# Patient Record
Sex: Male | Born: 1965 | Race: Black or African American | Hispanic: No | Marital: Single | Smoking: Never smoker
Health system: Southern US, Community
[De-identification: ages and names within clinical notes are randomized; demographics above are authoritative.]

## PROBLEM LIST (undated history)

## (undated) DIAGNOSIS — K5900 Constipation, unspecified: Secondary | ICD-10-CM

## (undated) DIAGNOSIS — D649 Anemia, unspecified: Secondary | ICD-10-CM

## (undated) DIAGNOSIS — J45909 Unspecified asthma, uncomplicated: Secondary | ICD-10-CM

## (undated) DIAGNOSIS — C801 Malignant (primary) neoplasm, unspecified: Secondary | ICD-10-CM

## (undated) DIAGNOSIS — C642 Malignant neoplasm of left kidney, except renal pelvis: Secondary | ICD-10-CM

## (undated) DIAGNOSIS — I10 Essential (primary) hypertension: Secondary | ICD-10-CM

## (undated) HISTORY — DX: Malignant neoplasm of left kidney, except renal pelvis: C64.2

## (undated) HISTORY — PX: NO PAST SURGERIES: SHX2092

---

## 2016-12-25 DIAGNOSIS — M109 Gout, unspecified: Secondary | ICD-10-CM | POA: Diagnosis not present

## 2017-01-21 DIAGNOSIS — Z13220 Encounter for screening for lipoid disorders: Secondary | ICD-10-CM | POA: Diagnosis not present

## 2017-01-21 DIAGNOSIS — Z131 Encounter for screening for diabetes mellitus: Secondary | ICD-10-CM | POA: Diagnosis not present

## 2017-01-21 DIAGNOSIS — Z125 Encounter for screening for malignant neoplasm of prostate: Secondary | ICD-10-CM | POA: Diagnosis not present

## 2017-01-21 DIAGNOSIS — I10 Essential (primary) hypertension: Secondary | ICD-10-CM | POA: Diagnosis not present

## 2017-01-21 DIAGNOSIS — Z202 Contact with and (suspected) exposure to infections with a predominantly sexual mode of transmission: Secondary | ICD-10-CM | POA: Diagnosis not present

## 2017-01-28 DIAGNOSIS — R7303 Prediabetes: Secondary | ICD-10-CM | POA: Diagnosis not present

## 2017-01-28 DIAGNOSIS — I10 Essential (primary) hypertension: Secondary | ICD-10-CM | POA: Diagnosis not present

## 2017-02-11 DIAGNOSIS — M109 Gout, unspecified: Secondary | ICD-10-CM | POA: Diagnosis not present

## 2017-02-22 DIAGNOSIS — I10 Essential (primary) hypertension: Secondary | ICD-10-CM | POA: Diagnosis not present

## 2017-02-22 DIAGNOSIS — Z79899 Other long term (current) drug therapy: Secondary | ICD-10-CM | POA: Diagnosis not present

## 2017-06-23 DIAGNOSIS — M109 Gout, unspecified: Secondary | ICD-10-CM | POA: Diagnosis not present

## 2017-07-07 DIAGNOSIS — M25462 Effusion, left knee: Secondary | ICD-10-CM | POA: Diagnosis not present

## 2017-07-07 DIAGNOSIS — I10 Essential (primary) hypertension: Secondary | ICD-10-CM | POA: Diagnosis not present

## 2017-07-07 DIAGNOSIS — M1732 Unilateral post-traumatic osteoarthritis, left knee: Secondary | ICD-10-CM | POA: Diagnosis not present

## 2017-07-07 DIAGNOSIS — M25562 Pain in left knee: Secondary | ICD-10-CM | POA: Diagnosis not present

## 2017-07-11 DIAGNOSIS — M25562 Pain in left knee: Secondary | ICD-10-CM | POA: Diagnosis not present

## 2017-07-11 DIAGNOSIS — G8929 Other chronic pain: Secondary | ICD-10-CM | POA: Diagnosis not present

## 2017-07-11 DIAGNOSIS — I10 Essential (primary) hypertension: Secondary | ICD-10-CM | POA: Diagnosis not present

## 2017-07-13 DIAGNOSIS — M25562 Pain in left knee: Secondary | ICD-10-CM | POA: Diagnosis not present

## 2017-07-13 DIAGNOSIS — M1732 Unilateral post-traumatic osteoarthritis, left knee: Secondary | ICD-10-CM | POA: Diagnosis not present

## 2017-07-15 DIAGNOSIS — M1712 Unilateral primary osteoarthritis, left knee: Secondary | ICD-10-CM | POA: Diagnosis not present

## 2017-07-29 DIAGNOSIS — M25562 Pain in left knee: Secondary | ICD-10-CM | POA: Diagnosis not present

## 2017-07-29 DIAGNOSIS — Z79899 Other long term (current) drug therapy: Secondary | ICD-10-CM | POA: Diagnosis not present

## 2017-07-29 DIAGNOSIS — I10 Essential (primary) hypertension: Secondary | ICD-10-CM | POA: Diagnosis not present

## 2017-07-29 DIAGNOSIS — R7303 Prediabetes: Secondary | ICD-10-CM | POA: Diagnosis not present

## 2017-08-30 DIAGNOSIS — M1712 Unilateral primary osteoarthritis, left knee: Secondary | ICD-10-CM | POA: Diagnosis not present

## 2017-11-07 DIAGNOSIS — I10 Essential (primary) hypertension: Secondary | ICD-10-CM | POA: Diagnosis not present

## 2017-11-07 DIAGNOSIS — R0789 Other chest pain: Secondary | ICD-10-CM | POA: Diagnosis not present

## 2017-11-07 DIAGNOSIS — R079 Chest pain, unspecified: Secondary | ICD-10-CM | POA: Diagnosis not present

## 2017-11-07 DIAGNOSIS — K219 Gastro-esophageal reflux disease without esophagitis: Secondary | ICD-10-CM | POA: Diagnosis not present

## 2017-11-10 DIAGNOSIS — I10 Essential (primary) hypertension: Secondary | ICD-10-CM | POA: Diagnosis not present

## 2017-11-10 DIAGNOSIS — R079 Chest pain, unspecified: Secondary | ICD-10-CM | POA: Diagnosis not present

## 2017-11-24 DIAGNOSIS — R079 Chest pain, unspecified: Secondary | ICD-10-CM | POA: Diagnosis not present

## 2018-02-07 DIAGNOSIS — K649 Unspecified hemorrhoids: Secondary | ICD-10-CM | POA: Diagnosis not present

## 2018-02-07 DIAGNOSIS — E669 Obesity, unspecified: Secondary | ICD-10-CM | POA: Diagnosis not present

## 2018-02-07 DIAGNOSIS — Z6837 Body mass index (BMI) 37.0-37.9, adult: Secondary | ICD-10-CM | POA: Diagnosis not present

## 2018-02-07 DIAGNOSIS — I10 Essential (primary) hypertension: Secondary | ICD-10-CM | POA: Diagnosis not present

## 2018-08-26 DIAGNOSIS — M5416 Radiculopathy, lumbar region: Secondary | ICD-10-CM | POA: Diagnosis not present

## 2018-08-26 DIAGNOSIS — Z760 Encounter for issue of repeat prescription: Secondary | ICD-10-CM | POA: Diagnosis not present

## 2018-08-26 DIAGNOSIS — I10 Essential (primary) hypertension: Secondary | ICD-10-CM | POA: Diagnosis not present

## 2018-09-29 DIAGNOSIS — I10 Essential (primary) hypertension: Secondary | ICD-10-CM | POA: Diagnosis not present

## 2018-09-29 DIAGNOSIS — Z125 Encounter for screening for malignant neoplasm of prostate: Secondary | ICD-10-CM | POA: Diagnosis not present

## 2018-09-29 DIAGNOSIS — Z79899 Other long term (current) drug therapy: Secondary | ICD-10-CM | POA: Diagnosis not present

## 2018-09-29 DIAGNOSIS — M109 Gout, unspecified: Secondary | ICD-10-CM | POA: Diagnosis not present

## 2018-09-29 DIAGNOSIS — R7301 Impaired fasting glucose: Secondary | ICD-10-CM | POA: Diagnosis not present

## 2018-09-29 DIAGNOSIS — M6780 Other specified disorders of synovium and tendon, unspecified site: Secondary | ICD-10-CM | POA: Diagnosis not present

## 2018-09-30 DIAGNOSIS — T783XXA Angioneurotic edema, initial encounter: Secondary | ICD-10-CM | POA: Diagnosis not present

## 2018-09-30 DIAGNOSIS — T464X5A Adverse effect of angiotensin-converting-enzyme inhibitors, initial encounter: Secondary | ICD-10-CM | POA: Diagnosis not present

## 2018-09-30 DIAGNOSIS — I10 Essential (primary) hypertension: Secondary | ICD-10-CM | POA: Diagnosis not present

## 2018-10-03 DIAGNOSIS — D509 Iron deficiency anemia, unspecified: Secondary | ICD-10-CM | POA: Diagnosis not present

## 2018-10-05 DIAGNOSIS — M25552 Pain in left hip: Secondary | ICD-10-CM | POA: Diagnosis not present

## 2018-10-05 DIAGNOSIS — M5137 Other intervertebral disc degeneration, lumbosacral region: Secondary | ICD-10-CM | POA: Diagnosis not present

## 2018-10-05 DIAGNOSIS — M546 Pain in thoracic spine: Secondary | ICD-10-CM | POA: Diagnosis not present

## 2018-10-05 DIAGNOSIS — S3992XA Unspecified injury of lower back, initial encounter: Secondary | ICD-10-CM | POA: Diagnosis not present

## 2018-10-05 DIAGNOSIS — M545 Low back pain: Secondary | ICD-10-CM | POA: Diagnosis not present

## 2018-10-05 DIAGNOSIS — D649 Anemia, unspecified: Secondary | ICD-10-CM | POA: Diagnosis not present

## 2018-10-11 DIAGNOSIS — D7389 Other diseases of spleen: Secondary | ICD-10-CM | POA: Diagnosis not present

## 2018-10-11 DIAGNOSIS — R310 Gross hematuria: Secondary | ICD-10-CM | POA: Diagnosis not present

## 2018-10-11 DIAGNOSIS — N2889 Other specified disorders of kidney and ureter: Secondary | ICD-10-CM | POA: Diagnosis not present

## 2018-10-12 DIAGNOSIS — M545 Low back pain: Secondary | ICD-10-CM | POA: Diagnosis not present

## 2018-10-12 DIAGNOSIS — N2889 Other specified disorders of kidney and ureter: Secondary | ICD-10-CM | POA: Diagnosis not present

## 2018-10-12 DIAGNOSIS — Z6837 Body mass index (BMI) 37.0-37.9, adult: Secondary | ICD-10-CM | POA: Diagnosis not present

## 2018-10-12 DIAGNOSIS — R161 Splenomegaly, not elsewhere classified: Secondary | ICD-10-CM | POA: Diagnosis not present

## 2018-10-26 DIAGNOSIS — C642 Malignant neoplasm of left kidney, except renal pelvis: Secondary | ICD-10-CM | POA: Diagnosis not present

## 2018-10-26 DIAGNOSIS — K5900 Constipation, unspecified: Secondary | ICD-10-CM

## 2018-10-26 DIAGNOSIS — N289 Disorder of kidney and ureter, unspecified: Secondary | ICD-10-CM | POA: Diagnosis not present

## 2018-10-26 DIAGNOSIS — D509 Iron deficiency anemia, unspecified: Secondary | ICD-10-CM | POA: Diagnosis not present

## 2018-10-26 DIAGNOSIS — E538 Deficiency of other specified B group vitamins: Secondary | ICD-10-CM | POA: Diagnosis not present

## 2018-10-26 DIAGNOSIS — E559 Vitamin D deficiency, unspecified: Secondary | ICD-10-CM | POA: Diagnosis not present

## 2018-11-08 DIAGNOSIS — N289 Disorder of kidney and ureter, unspecified: Secondary | ICD-10-CM | POA: Diagnosis not present

## 2018-11-08 DIAGNOSIS — R918 Other nonspecific abnormal finding of lung field: Secondary | ICD-10-CM | POA: Diagnosis not present

## 2018-11-08 DIAGNOSIS — C649 Malignant neoplasm of unspecified kidney, except renal pelvis: Secondary | ICD-10-CM | POA: Diagnosis not present

## 2018-11-08 DIAGNOSIS — C642 Malignant neoplasm of left kidney, except renal pelvis: Secondary | ICD-10-CM | POA: Diagnosis not present

## 2018-11-08 DIAGNOSIS — R911 Solitary pulmonary nodule: Secondary | ICD-10-CM | POA: Diagnosis not present

## 2018-11-10 DIAGNOSIS — N2889 Other specified disorders of kidney and ureter: Secondary | ICD-10-CM | POA: Diagnosis not present

## 2018-11-10 DIAGNOSIS — C642 Malignant neoplasm of left kidney, except renal pelvis: Secondary | ICD-10-CM | POA: Diagnosis not present

## 2018-11-10 DIAGNOSIS — I10 Essential (primary) hypertension: Secondary | ICD-10-CM | POA: Diagnosis not present

## 2018-11-15 DIAGNOSIS — C642 Malignant neoplasm of left kidney, except renal pelvis: Secondary | ICD-10-CM | POA: Diagnosis not present

## 2018-11-15 DIAGNOSIS — N289 Disorder of kidney and ureter, unspecified: Secondary | ICD-10-CM | POA: Diagnosis not present

## 2018-11-17 DIAGNOSIS — D500 Iron deficiency anemia secondary to blood loss (chronic): Secondary | ICD-10-CM | POA: Insufficient documentation

## 2018-11-24 DIAGNOSIS — N289 Disorder of kidney and ureter, unspecified: Secondary | ICD-10-CM | POA: Diagnosis not present

## 2018-11-24 DIAGNOSIS — C642 Malignant neoplasm of left kidney, except renal pelvis: Secondary | ICD-10-CM | POA: Diagnosis not present

## 2018-11-25 DIAGNOSIS — R58 Hemorrhage, not elsewhere classified: Secondary | ICD-10-CM | POA: Diagnosis not present

## 2018-11-25 DIAGNOSIS — R310 Gross hematuria: Secondary | ICD-10-CM | POA: Diagnosis not present

## 2018-11-25 DIAGNOSIS — N2889 Other specified disorders of kidney and ureter: Secondary | ICD-10-CM | POA: Diagnosis not present

## 2018-11-25 DIAGNOSIS — R319 Hematuria, unspecified: Secondary | ICD-10-CM | POA: Diagnosis not present

## 2018-11-25 DIAGNOSIS — C642 Malignant neoplasm of left kidney, except renal pelvis: Secondary | ICD-10-CM | POA: Diagnosis not present

## 2018-11-25 DIAGNOSIS — D49512 Neoplasm of unspecified behavior of left kidney: Secondary | ICD-10-CM | POA: Diagnosis not present

## 2018-11-25 DIAGNOSIS — R52 Pain, unspecified: Secondary | ICD-10-CM | POA: Diagnosis not present

## 2018-12-04 DIAGNOSIS — C642 Malignant neoplasm of left kidney, except renal pelvis: Secondary | ICD-10-CM | POA: Diagnosis not present

## 2018-12-04 DIAGNOSIS — D649 Anemia, unspecified: Secondary | ICD-10-CM | POA: Diagnosis not present

## 2018-12-04 DIAGNOSIS — R310 Gross hematuria: Secondary | ICD-10-CM | POA: Diagnosis not present

## 2018-12-04 DIAGNOSIS — Z125 Encounter for screening for malignant neoplasm of prostate: Secondary | ICD-10-CM | POA: Diagnosis not present

## 2018-12-04 DIAGNOSIS — R1012 Left upper quadrant pain: Secondary | ICD-10-CM | POA: Diagnosis not present

## 2018-12-13 ENCOUNTER — Other Ambulatory Visit: Payer: Self-pay | Admitting: Urology

## 2018-12-26 DIAGNOSIS — K649 Unspecified hemorrhoids: Secondary | ICD-10-CM | POA: Diagnosis not present

## 2018-12-26 DIAGNOSIS — D649 Anemia, unspecified: Secondary | ICD-10-CM | POA: Diagnosis not present

## 2018-12-26 DIAGNOSIS — I10 Essential (primary) hypertension: Secondary | ICD-10-CM | POA: Diagnosis not present

## 2018-12-26 DIAGNOSIS — R310 Gross hematuria: Secondary | ICD-10-CM | POA: Diagnosis not present

## 2018-12-26 DIAGNOSIS — R0602 Shortness of breath: Secondary | ICD-10-CM | POA: Diagnosis not present

## 2018-12-26 DIAGNOSIS — C641 Malignant neoplasm of right kidney, except renal pelvis: Secondary | ICD-10-CM | POA: Diagnosis not present

## 2018-12-26 DIAGNOSIS — R0600 Dyspnea, unspecified: Secondary | ICD-10-CM | POA: Diagnosis not present

## 2018-12-27 DIAGNOSIS — D509 Iron deficiency anemia, unspecified: Secondary | ICD-10-CM | POA: Diagnosis not present

## 2018-12-28 DIAGNOSIS — D509 Iron deficiency anemia, unspecified: Secondary | ICD-10-CM | POA: Diagnosis not present

## 2018-12-29 DIAGNOSIS — D509 Iron deficiency anemia, unspecified: Secondary | ICD-10-CM | POA: Diagnosis not present

## 2018-12-29 DIAGNOSIS — C642 Malignant neoplasm of left kidney, except renal pelvis: Secondary | ICD-10-CM | POA: Diagnosis not present

## 2019-01-01 DIAGNOSIS — R1012 Left upper quadrant pain: Secondary | ICD-10-CM | POA: Diagnosis not present

## 2019-01-01 DIAGNOSIS — R310 Gross hematuria: Secondary | ICD-10-CM | POA: Diagnosis not present

## 2019-01-01 DIAGNOSIS — C642 Malignant neoplasm of left kidney, except renal pelvis: Secondary | ICD-10-CM | POA: Diagnosis not present

## 2019-01-01 DIAGNOSIS — D649 Anemia, unspecified: Secondary | ICD-10-CM | POA: Diagnosis not present

## 2019-01-02 DIAGNOSIS — C642 Malignant neoplasm of left kidney, except renal pelvis: Secondary | ICD-10-CM | POA: Diagnosis not present

## 2019-01-02 DIAGNOSIS — D509 Iron deficiency anemia, unspecified: Secondary | ICD-10-CM | POA: Diagnosis not present

## 2019-01-04 NOTE — Patient Instructions (Addendum)
YOU NEED TO HAVE A COVID 19 TEST ON__8-9-20_____ @_______ , THIS TEST MUST BE DONE BEFORE SURGERY, [address]E  [address] [place] , [id number]. ONCE YOUR COVID TEST IS COMPLETED, PLEASE BEGIN THE QUARANTINE INSTRUCTIONS AS OUTLINED IN YOUR HANDOUT.                Yusuke Beza  01/04/2019   Your procedure is scheduled on:  01-10-19   Report to [facility] Main  Entrance              Report to admitting at       1000 AM   [place].    Call this number if you have problems the morning of surgery [phone]    Remember: Follow a clear liquid diet the day before surgery and drink one bottle of magnesium citrate by noon the day         before your surgery    CLEAR LIQUID DIET   Foods Allowed                                                                     Foods Excluded  Coffee and tea, regular and decaf                             liquids that you cannot  Plain Jell-O any favor except red or purple                                           see through such as: Fruit ices (not with fruit pulp)                                     milk, soups, orange juice  Iced Popsicles                                    All solid food Carbonated beverages, regular and diet                                    Cranberry, grape and apple juices Sports drinks like Gatorade Lightly seasoned clear broth or consume(fat free) Sugar, honey syrup  Sample Menu Breakfast                                Lunch                                     Supper Cranberry juice                    Beef broth  Chicken broth Jell-O                                     Grape juice                           Apple juice Coffee or tea                        Jell-O                                      Popsicle                                                Coffee or tea                        Coffee or  tea  _____________________________________________________________________  BRUSH YOUR TEETH MORNING OF SURGERY AND RINSE YOUR MOUTH OUT, NO CHEWING GUM CANDY OR MINTS.     Take these medicines the morning of surgery with A SIP OF WATER:  DO NOT TAKE ANY DIABETIC MEDICATIONS DAY OF YOUR SURGERY                               You may not have any metal on your body including hair pins and              piercings  Do not wear jewelry, , lotions, powders or perfumes, deodorant                      Men may shave face and neck.   Do not bring valuables to the hospital. [place].  Contacts, dentures or bridgework may not be worn into surgery.    [place] - Preparing for Surgery  Before surgery, you can play an important role.  Because skin is not sterile, your skin needs to be as free of germs as possible.  You can reduce the number of germs on your skin by washing with CHG (chlorahexidine gluconate) soap before surgery.  CHG is an antiseptic cleaner which kills germs and bonds with the skin to continue killing germs even after washing. Please DO NOT use if you have an allergy to CHG or antibacterial soaps.  If your skin becomes reddened/irritated stop using the CHG and inform your nurse when you arrive at Short Stay. Do not shave (including legs and underarms) for at least 48 hours prior to the first CHG shower.  You may shave your face/neck. Please follow these instructions carefully:  1.  Shower with CHG Soap the night before surgery and the  morning of Surgery.  2.  If you choose to wash your hair, wash your hair first as usual with your  normal  shampoo.  3.  After you shampoo, rinse your hair and body thoroughly to remove the  shampoo.  4.  Use CHG as you would any other liquid soap.  You can apply chg directly  to the skin and wash                       Gently with a scrungie or clean washcloth.  5.  Apply the  CHG Soap to your body ONLY FROM THE NECK DOWN.   Do not use on face/ open                           Wound or open sores. Avoid contact with eyes, ears mouth and genitals (private parts).                       Wash face,  Genitals (private parts) with your normal soap.             6.  Wash thoroughly, paying special attention to the area where your surgery  will be performed.  7.  Thoroughly rinse your body with warm water from the neck down.  8.  DO NOT shower/wash with your normal soap after using and rinsing off  the CHG Soap.                9.  Pat yourself dry with a clean towel.            10.  Wear clean pajamas.            11.  Place clean sheets on your bed the night of your first shower and do not  sleep with pets. Day of Surgery : Do not apply any lotions/deodorants the morning of surgery.  Please wear clean clothes to the hospital/surgery center.  FAILURE TO FOLLOW THESE INSTRUCTIONS MAY RESULT IN THE CANCELLATION OF YOUR SURGERY PATIENT SIGNATURE_________________________________  NURSE SIGNATURE__________________________________  ________________________________________________________________________

## 2019-01-05 ENCOUNTER — Encounter (HOSPITAL_COMMUNITY)
Admission: RE | Admit: 2019-01-05 | Discharge: 2019-01-05 | Disposition: A | Discharge status: Home / Self Care | Payer: BC Managed Care – PPO | Source: Ambulatory Visit | Attending: Urology | Admitting: Urology

## 2019-01-05 ENCOUNTER — Other Ambulatory Visit: Payer: Self-pay

## 2019-01-05 ENCOUNTER — Encounter (HOSPITAL_COMMUNITY): Payer: Self-pay

## 2019-01-05 DIAGNOSIS — C642 Malignant neoplasm of left kidney, except renal pelvis: Secondary | ICD-10-CM | POA: Insufficient documentation

## 2019-01-05 DIAGNOSIS — R319 Hematuria, unspecified: Secondary | ICD-10-CM | POA: Diagnosis not present

## 2019-01-05 DIAGNOSIS — Z01818 Encounter for other preprocedural examination: Secondary | ICD-10-CM | POA: Insufficient documentation

## 2019-01-05 DIAGNOSIS — R9431 Abnormal electrocardiogram [ECG] [EKG]: Secondary | ICD-10-CM | POA: Diagnosis not present

## 2019-01-05 HISTORY — DX: Constipation, unspecified: K59.00

## 2019-01-05 HISTORY — DX: Anemia, unspecified: D64.9

## 2019-01-05 HISTORY — DX: Malignant (primary) neoplasm, unspecified: C80.1

## 2019-01-05 HISTORY — DX: Essential (primary) hypertension: I10

## 2019-01-05 HISTORY — DX: Unspecified asthma, uncomplicated: J45.909

## 2019-01-05 LAB — CBC
HCT: 29.7 % — ABNORMAL LOW (ref 39.0–52.0)
Hemoglobin: 8.6 g/dL — ABNORMAL LOW (ref 13.0–17.0)
MCH: 23.1 pg — ABNORMAL LOW (ref 26.0–34.0)
MCHC: 29 g/dL — ABNORMAL LOW (ref 30.0–36.0)
MCV: 79.8 fL — ABNORMAL LOW (ref 80.0–100.0)
Platelets: 355 10*3/uL (ref 150–400)
RBC: 3.72 MIL/uL — ABNORMAL LOW (ref 4.22–5.81)
RDW: 15.8 % — ABNORMAL HIGH (ref 11.5–15.5)
WBC: 7.4 10*3/uL (ref 4.0–10.5)
nRBC: 0 % (ref 0.0–0.2)

## 2019-01-05 LAB — BASIC METABOLIC PANEL
Anion gap: 9 (ref 5–15)
BUN: 14 mg/dL (ref 6–20)
CO2: 26 mmol/L (ref 22–32)
Calcium: 9.2 mg/dL (ref 8.9–10.3)
Chloride: 102 mmol/L (ref 98–111)
Creatinine, Ser: 1.1 mg/dL (ref 0.61–1.24)
GFR calc Af Amer: >60 mL/min (ref >60)
GFR calc non Af Amer: >60 mL/min (ref >60)
Glucose, Bld: 104 mg/dL — ABNORMAL HIGH (ref 70–99)
Potassium: 4.1 mmol/L (ref 3.5–5.1)
Sodium: 137 mmol/L (ref 135–145)

## 2019-01-06 ENCOUNTER — Other Ambulatory Visit (HOSPITAL_COMMUNITY)
Admission: RE | Admit: 2019-01-06 | Discharge: 2019-01-06 | Disposition: A | Discharge status: Home / Self Care | Payer: BC Managed Care – PPO | Source: Ambulatory Visit | Attending: Urology | Admitting: Urology

## 2019-01-06 DIAGNOSIS — Z20828 Contact with and (suspected) exposure to other viral communicable diseases: Secondary | ICD-10-CM | POA: Diagnosis not present

## 2019-01-06 DIAGNOSIS — Z01812 Encounter for preprocedural laboratory examination: Secondary | ICD-10-CM | POA: Diagnosis not present

## 2019-01-06 LAB — ABO/RH: ABO/RH(D): O POS

## 2019-01-07 LAB — SARS CORONAVIRUS 2 (TAT 6-24 HRS): SARS Coronavirus 2: NEGATIVE

## 2019-01-10 ENCOUNTER — Other Ambulatory Visit: Payer: Self-pay

## 2019-01-10 ENCOUNTER — Inpatient Hospital Stay (HOSPITAL_COMMUNITY): Payer: BC Managed Care – PPO | Admitting: Physician Assistant

## 2019-01-10 ENCOUNTER — Inpatient Hospital Stay (HOSPITAL_COMMUNITY)
Admission: RE | Admit: 2019-01-10 | Discharge: 2019-01-12 | DRG: 658 | Disposition: A | Discharge status: Home / Self Care | Payer: BC Managed Care – PPO | Attending: Urology | Admitting: Urology

## 2019-01-10 ENCOUNTER — Encounter (HOSPITAL_COMMUNITY): Payer: Self-pay | Admitting: General Practice

## 2019-01-10 ENCOUNTER — Encounter (HOSPITAL_COMMUNITY)
Admission: RE | Disposition: A | Discharge status: Home / Self Care | Payer: Self-pay | Source: Home / Self Care | Attending: Urology

## 2019-01-10 DIAGNOSIS — C642 Malignant neoplasm of left kidney, except renal pelvis: Principal | ICD-10-CM | POA: Diagnosis present

## 2019-01-10 DIAGNOSIS — I10 Essential (primary) hypertension: Secondary | ICD-10-CM | POA: Diagnosis not present

## 2019-01-10 DIAGNOSIS — Z888 Allergy status to other drugs, medicaments and biological substances status: Secondary | ICD-10-CM | POA: Diagnosis not present

## 2019-01-10 DIAGNOSIS — D7389 Other diseases of spleen: Secondary | ICD-10-CM | POA: Diagnosis not present

## 2019-01-10 DIAGNOSIS — N2889 Other specified disorders of kidney and ureter: Secondary | ICD-10-CM | POA: Diagnosis present

## 2019-01-10 DIAGNOSIS — D630 Anemia in neoplastic disease: Secondary | ICD-10-CM | POA: Diagnosis present

## 2019-01-10 DIAGNOSIS — J45909 Unspecified asthma, uncomplicated: Secondary | ICD-10-CM | POA: Diagnosis present

## 2019-01-10 DIAGNOSIS — G893 Neoplasm related pain (acute) (chronic): Secondary | ICD-10-CM | POA: Diagnosis present

## 2019-01-10 DIAGNOSIS — M109 Gout, unspecified: Secondary | ICD-10-CM | POA: Diagnosis present

## 2019-01-10 DIAGNOSIS — D649 Anemia, unspecified: Secondary | ICD-10-CM | POA: Diagnosis not present

## 2019-01-10 HISTORY — PX: CYSTOSCOPY: SHX5120

## 2019-01-10 HISTORY — PX: ROBOT ASSISTED LAPAROSCOPIC NEPHRECTOMY: SHX5140

## 2019-01-10 LAB — CBC
HCT: 31.2 % — ABNORMAL LOW (ref 39.0–52.0)
Hemoglobin: 9.2 g/dL — ABNORMAL LOW (ref 13.0–17.0)
MCH: 23.2 pg — ABNORMAL LOW (ref 26.0–34.0)
MCHC: 29.5 g/dL — ABNORMAL LOW (ref 30.0–36.0)
MCV: 78.8 fL — ABNORMAL LOW (ref 80.0–100.0)
Platelets: 382 10*3/uL (ref 150–400)
RBC: 3.96 MIL/uL — ABNORMAL LOW (ref 4.22–5.81)
RDW: 15.5 % (ref 11.5–15.5)
WBC: 6.8 10*3/uL (ref 4.0–10.5)
nRBC: 0 % (ref 0.0–0.2)

## 2019-01-10 LAB — PREPARE RBC (CROSSMATCH)

## 2019-01-10 LAB — HEMOGLOBIN AND HEMATOCRIT, BLOOD
HCT: 29.4 % — ABNORMAL LOW (ref 39.0–52.0)
Hemoglobin: 8.8 g/dL — ABNORMAL LOW (ref 13.0–17.0)

## 2019-01-10 SURGERY — NEPHRECTOMY, RADICAL, ROBOT-ASSISTED, LAPAROSCOPIC, ADULT
Anesthesia: General

## 2019-01-10 MED ORDER — ROCURONIUM BROMIDE 10 MG/ML (PF) SYRINGE
PREFILLED_SYRINGE | INTRAVENOUS | Status: AC
  Filled 2019-01-10: qty 10

## 2019-01-10 MED ORDER — CEFAZOLIN SODIUM-DEXTROSE 2-4 GM/100ML-% IV SOLN
2.0000 g | INTRAVENOUS | Status: AC
  Administered 2019-01-10: 2 g via INTRAVENOUS
  Filled 2019-01-10: qty 100

## 2019-01-10 MED ORDER — ONDANSETRON HCL 4 MG/2ML IJ SOLN: 4.0000 mg | INTRAMUSCULAR | Status: DC | PRN

## 2019-01-10 MED ORDER — HYDROMORPHONE HCL 1 MG/ML IJ SOLN
INTRAMUSCULAR | Status: AC
  Filled 2019-01-10: qty 2

## 2019-01-10 MED ORDER — OXYCODONE HCL 5 MG PO TABS
5.0000 mg | ORAL_TABLET | ORAL | Status: DC | PRN
  Administered 2019-01-10 – 2019-01-11 (×2): 5 mg via ORAL
  Filled 2019-01-10 (×2): qty 1

## 2019-01-10 MED ORDER — OXYCODONE HCL 10 MG PO TABS: 10.0000 mg | ORAL_TABLET | ORAL | 0 refills | Status: DC | PRN

## 2019-01-10 MED ORDER — PROPOFOL 10 MG/ML IV BOLUS
INTRAVENOUS | Status: DC | PRN
  Administered 2019-01-10: 40 mg via INTRAVENOUS
  Administered 2019-01-10: 160 mg via INTRAVENOUS

## 2019-01-10 MED ORDER — MIDAZOLAM HCL 5 MG/5ML IJ SOLN
INTRAMUSCULAR | Status: DC | PRN
  Administered 2019-01-10: 2 mg via INTRAVENOUS

## 2019-01-10 MED ORDER — HYDROMORPHONE HCL 1 MG/ML IJ SOLN
0.2500 mg | INTRAMUSCULAR | Status: DC | PRN
  Administered 2019-01-10: 0.25 mg via INTRAVENOUS

## 2019-01-10 MED ORDER — LACTATED RINGERS IR SOLN
Status: DC | PRN
  Administered 2019-01-10: 1000 mL

## 2019-01-10 MED ORDER — DIPHENHYDRAMINE HCL 50 MG/ML IJ SOLN: 12.5000 mg | Freq: Four times a day (QID) | INTRAMUSCULAR | Status: DC | PRN

## 2019-01-10 MED ORDER — SODIUM CHLORIDE (PF) 0.9 % IJ SOLN
INTRAMUSCULAR | Status: DC | PRN
  Administered 2019-01-10: 20 mL

## 2019-01-10 MED ORDER — BUPIVACAINE LIPOSOME 1.3 % IJ SUSP
20.0000 mL | Freq: Once | INTRAMUSCULAR | Status: AC
  Administered 2019-01-10: 15:00:00 20 mL
  Filled 2019-01-10: qty 20

## 2019-01-10 MED ORDER — DIPHENHYDRAMINE HCL 12.5 MG/5ML PO ELIX: 12.5000 mg | ORAL_SOLUTION | Freq: Four times a day (QID) | ORAL | Status: DC | PRN

## 2019-01-10 MED ORDER — GABAPENTIN 300 MG PO CAPS
300.0000 mg | ORAL_CAPSULE | ORAL | Status: AC
  Administered 2019-01-10: 300 mg via ORAL
  Filled 2019-01-10: qty 1

## 2019-01-10 MED ORDER — BACITRACIN-NEOMYCIN-POLYMYXIN 400-5-5000 EX OINT: 1.0000 "application " | TOPICAL_OINTMENT | Freq: Three times a day (TID) | CUTANEOUS | Status: DC | PRN

## 2019-01-10 MED ORDER — HYDROMORPHONE HCL 1 MG/ML IJ SOLN
0.5000 mg | INTRAMUSCULAR | Status: DC | PRN
  Administered 2019-01-10: 0.5 mg via INTRAVENOUS
  Filled 2019-01-10: qty 1

## 2019-01-10 MED ORDER — IRBESARTAN 150 MG PO TABS
150.0000 mg | ORAL_TABLET | Freq: Every day | ORAL | Status: DC
  Administered 2019-01-10 – 2019-01-12 (×3): 150 mg via ORAL
  Filled 2019-01-10 (×3): qty 1

## 2019-01-10 MED ORDER — SODIUM CHLORIDE 0.45 % IV SOLN: INTRAVENOUS | Status: DC | PRN

## 2019-01-10 MED ORDER — STERILE WATER FOR IRRIGATION IR SOLN
Status: DC | PRN
  Administered 2019-01-10: 1000 mL

## 2019-01-10 MED ORDER — FENTANYL CITRATE (PF) 250 MCG/5ML IJ SOLN
INTRAMUSCULAR | Status: AC
  Filled 2019-01-10: qty 5

## 2019-01-10 MED ORDER — ONDANSETRON HCL 4 MG/2ML IJ SOLN
INTRAMUSCULAR | Status: DC | PRN
  Administered 2019-01-10: 4 mg via INTRAVENOUS

## 2019-01-10 MED ORDER — SUGAMMADEX SODIUM 500 MG/5ML IV SOLN
INTRAVENOUS | Status: DC | PRN
  Administered 2019-01-10: 210 mg via INTRAVENOUS

## 2019-01-10 MED ORDER — SODIUM CHLORIDE (PF) 0.9 % IJ SOLN
INTRAMUSCULAR | Status: AC
  Filled 2019-01-10: qty 20

## 2019-01-10 MED ORDER — MAGNESIUM CITRATE PO SOLN: 1.0000 | Freq: Once | ORAL | Status: DC

## 2019-01-10 MED ORDER — SODIUM CHLORIDE 0.9 % IV SOLN
INTRAVENOUS | Status: DC | PRN
  Administered 2019-01-10: 13:00:00 via INTRAVENOUS

## 2019-01-10 MED ORDER — DEXAMETHASONE SODIUM PHOSPHATE 10 MG/ML IJ SOLN
INTRAMUSCULAR | Status: AC
  Filled 2019-01-10: qty 1

## 2019-01-10 MED ORDER — FENTANYL CITRATE (PF) 250 MCG/5ML IJ SOLN
INTRAMUSCULAR | Status: DC | PRN
  Administered 2019-01-10: 50 ug via INTRAVENOUS
  Administered 2019-01-10: 100 ug via INTRAVENOUS
  Administered 2019-01-10: 50 ug via INTRAVENOUS
  Administered 2019-01-10: 100 ug via INTRAVENOUS
  Administered 2019-01-10 (×3): 50 ug via INTRAVENOUS

## 2019-01-10 MED ORDER — LIDOCAINE HCL (CARDIAC) PF 100 MG/5ML IV SOSY
PREFILLED_SYRINGE | INTRAVENOUS | Status: DC | PRN
  Administered 2019-01-10: 80 mg via INTRAVENOUS

## 2019-01-10 MED ORDER — SUGAMMADEX SODIUM 500 MG/5ML IV SOLN
INTRAVENOUS | Status: AC
  Filled 2019-01-10: qty 5

## 2019-01-10 MED ORDER — KETAMINE HCL 10 MG/ML IJ SOLN
INTRAMUSCULAR | Status: AC
  Filled 2019-01-10: qty 1

## 2019-01-10 MED ORDER — ONDANSETRON HCL 4 MG/2ML IJ SOLN
INTRAMUSCULAR | Status: AC
  Filled 2019-01-10: qty 2

## 2019-01-10 MED ORDER — KETAMINE HCL 10 MG/ML IJ SOLN
INTRAMUSCULAR | Status: DC | PRN
  Administered 2019-01-10 (×2): 10 mg via INTRAVENOUS
  Administered 2019-01-10: 40 mg via INTRAVENOUS

## 2019-01-10 MED ORDER — ACETAMINOPHEN 500 MG PO TABS
1000.0000 mg | ORAL_TABLET | Freq: Four times a day (QID) | ORAL | Status: AC
  Administered 2019-01-10 – 2019-01-11 (×4): 1000 mg via ORAL
  Filled 2019-01-10 (×4): qty 2

## 2019-01-10 MED ORDER — OXYCODONE HCL 5 MG PO TABS
10.0000 mg | ORAL_TABLET | ORAL | Status: AC
  Administered 2019-01-10: 10 mg via ORAL
  Filled 2019-01-10: qty 2

## 2019-01-10 MED ORDER — PROMETHAZINE HCL 25 MG/ML IJ SOLN: 6.2500 mg | INTRAMUSCULAR | Status: DC | PRN

## 2019-01-10 MED ORDER — LACTATED RINGERS IV SOLN
INTRAVENOUS | Status: DC
  Administered 2019-01-10: 11:00:00 via INTRAVENOUS

## 2019-01-10 MED ORDER — BELLADONNA ALKALOIDS-OPIUM 16.2-60 MG RE SUPP: 1.0000 | Freq: Four times a day (QID) | RECTAL | Status: DC | PRN

## 2019-01-10 MED ORDER — LIDOCAINE 2% (20 MG/ML) 5 ML SYRINGE
INTRAMUSCULAR | Status: AC
  Filled 2019-01-10: qty 5

## 2019-01-10 MED ORDER — PROPOFOL 10 MG/ML IV BOLUS
INTRAVENOUS | Status: AC
  Filled 2019-01-10: qty 20

## 2019-01-10 MED ORDER — LACTATED RINGERS IV SOLN
INTRAVENOUS | Status: DC
  Administered 2019-01-10 (×2): via INTRAVENOUS

## 2019-01-10 MED ORDER — DEXAMETHASONE SODIUM PHOSPHATE 10 MG/ML IJ SOLN
INTRAMUSCULAR | Status: DC | PRN
  Administered 2019-01-10: 10 mg via INTRAVENOUS

## 2019-01-10 MED ORDER — LIDOCAINE 20MG/ML (2%) 15 ML SYRINGE OPTIME
INTRAMUSCULAR | Status: DC | PRN
  Administered 2019-01-10: 1.5 mg/kg/h via INTRAVENOUS

## 2019-01-10 MED ORDER — ROCURONIUM BROMIDE 10 MG/ML (PF) SYRINGE
PREFILLED_SYRINGE | INTRAVENOUS | Status: DC | PRN
  Administered 2019-01-10: 60 mg via INTRAVENOUS
  Administered 2019-01-10 (×2): 10 mg via INTRAVENOUS
  Administered 2019-01-10: 20 mg via INTRAVENOUS

## 2019-01-10 MED ORDER — ACETAMINOPHEN 500 MG PO TABS
1000.0000 mg | ORAL_TABLET | ORAL | Status: AC
  Administered 2019-01-10: 1000 mg via ORAL
  Filled 2019-01-10: qty 2

## 2019-01-10 MED ORDER — DOCUSATE SODIUM 100 MG PO CAPS
100.0000 mg | ORAL_CAPSULE | Freq: Two times a day (BID) | ORAL | Status: DC
  Administered 2019-01-10 – 2019-01-12 (×4): 100 mg via ORAL
  Filled 2019-01-10 (×4): qty 1

## 2019-01-10 MED ORDER — MIDAZOLAM HCL 2 MG/2ML IJ SOLN
INTRAMUSCULAR | Status: AC
  Filled 2019-01-10: qty 2

## 2019-01-10 MED ORDER — ROCURONIUM BROMIDE 100 MG/10ML IV SOLN: INTRAVENOUS | Status: DC | PRN

## 2019-01-10 MED ORDER — DEXTROSE-NACL 5-0.45 % IV SOLN
INTRAVENOUS | Status: DC
  Administered 2019-01-10 – 2019-01-11 (×2): via INTRAVENOUS

## 2019-01-10 SURGICAL SUPPLY — 60 items
BAG LAPAROSCOPIC 12 15 PORT 16 (BASKET) ×2 IMPLANT
BAG RETRIEVAL 12/15 (BASKET) ×3
CHLORAPREP W/TINT 26 (MISCELLANEOUS) ×3 IMPLANT
CLIP VESOLOCK LG 6/CT PURPLE (CLIP) ×3 IMPLANT
CLIP VESOLOCK MED LG 6/CT (CLIP) ×3 IMPLANT
CLIP VESOLOCK XL 6/CT (CLIP) ×6 IMPLANT
CLOTH BEACON ORANGE TIMEOUT ST (SAFETY) ×3 IMPLANT
COVER SURGICAL LIGHT HANDLE (MISCELLANEOUS) ×3 IMPLANT
COVER TIP SHEARS 8 DVNC (MISCELLANEOUS) ×2 IMPLANT
COVER TIP SHEARS 8MM DA VINCI (MISCELLANEOUS) ×1
COVER WAND RF STERILE (DRAPES) ×3 IMPLANT
CUTTER ECHEON FLEX ENDO 45 340 (ENDOMECHANICALS) ×21 IMPLANT
DECANTER SPIKE VIAL GLASS SM (MISCELLANEOUS) ×3 IMPLANT
DERMABOND ADVANCED (GAUZE/BANDAGES/DRESSINGS) ×1
DERMABOND ADVANCED .7 DNX12 (GAUZE/BANDAGES/DRESSINGS) ×2 IMPLANT
DRAIN CHANNEL 15F RND FF 3/16 (WOUND CARE) IMPLANT
DRAPE ARM DVNC X/XI (DISPOSABLE) ×8 IMPLANT
DRAPE COLUMN DVNC XI (DISPOSABLE) ×2 IMPLANT
DRAPE DA VINCI XI ARM (DISPOSABLE) ×4
DRAPE DA VINCI XI COLUMN (DISPOSABLE) ×1
DRAPE INCISE IOBAN 66X45 STRL (DRAPES) ×3 IMPLANT
DRAPE LAPAROSCOPIC ABDOMINAL (DRAPES) IMPLANT
DRAPE SHEET LG 3/4 BI-LAMINATE (DRAPES) ×3 IMPLANT
ELECT PENCIL ROCKER SW 15FT (MISCELLANEOUS) ×3 IMPLANT
ELECT REM PT RETURN 15FT ADLT (MISCELLANEOUS) ×3 IMPLANT
EVACUATOR SILICONE 100CC (DRAIN) IMPLANT
GLOVE BIO SURGEON STRL SZ 6.5 (GLOVE) ×6 IMPLANT
GLOVE BIOGEL M STRL SZ7.5 (GLOVE) ×9 IMPLANT
GOWN STRL REUS W/TWL LRG LVL3 (GOWN DISPOSABLE) ×15 IMPLANT
HEMOSTAT SURGICEL 4X8 (HEMOSTASIS) ×3 IMPLANT
IRRIG SUCT STRYKERFLOW 2 WTIP (MISCELLANEOUS) ×3
IRRIGATION SUCT STRKRFLW 2 WTP (MISCELLANEOUS) ×2 IMPLANT
KIT BASIN OR (CUSTOM PROCEDURE TRAY) ×3 IMPLANT
KIT TURNOVER KIT A (KITS) ×3 IMPLANT
LOOP VESSEL MAXI BLUE (MISCELLANEOUS) ×3 IMPLANT
NEEDLE INSUFFLATION 14GA 120MM (NEEDLE) ×3 IMPLANT
PORT ACCESS TROCAR AIRSEAL 12 (TROCAR) IMPLANT
PORT ACCESS TROCAR AIRSEAL 5M (TROCAR)
PROTECTOR NERVE ULNAR (MISCELLANEOUS) ×6 IMPLANT
SEAL CANN UNIV 5-8 DVNC XI (MISCELLANEOUS) ×8 IMPLANT
SEAL XI 5MM-8MM UNIVERSAL (MISCELLANEOUS) ×4
SET IRRIG Y TYPE TUR BLADDER L (SET/KITS/TRAYS/PACK) ×3 IMPLANT
SET TRI-LUMEN FLTR TB AIRSEAL (TUBING) IMPLANT
SOLUTION ELECTROLUBE (MISCELLANEOUS) ×3 IMPLANT
SPONGE LAP 4X18 RFD (DISPOSABLE) ×3 IMPLANT
STAPLE RELOAD 45 WHT (STAPLE) ×2 IMPLANT
STAPLE RELOAD 45MM WHITE (STAPLE) ×1
SUT ETHILON 3 0 PS 1 (SUTURE) IMPLANT
SUT MNCRL AB 4-0 PS2 18 (SUTURE) ×6 IMPLANT
SUT PDS AB 1 CT1 27 (SUTURE) ×9 IMPLANT
SUT VICRYL 0 UR6 27IN ABS (SUTURE) IMPLANT
TOWEL OR 17X26 10 PK STRL BLUE (TOWEL DISPOSABLE) ×3 IMPLANT
TOWEL OR NON WOVEN STRL DISP B (DISPOSABLE) ×3 IMPLANT
TRAY FOLEY MTR SLVR 16FR STAT (SET/KITS/TRAYS/PACK) ×3 IMPLANT
TRAY LAPAROSCOPIC (CUSTOM PROCEDURE TRAY) ×3 IMPLANT
TROCAR BLADELESS OPT 5 100 (ENDOMECHANICALS) IMPLANT
TROCAR UNIVERSAL OPT 12M 100M (ENDOMECHANICALS) ×3 IMPLANT
TROCAR XCEL 12X100 BLDLESS (ENDOMECHANICALS) ×3 IMPLANT
TUBING CONNECTING 10 (TUBING) ×3 IMPLANT
WATER STERILE IRR 1000ML POUR (IV SOLUTION) ×3 IMPLANT

## 2019-01-10 NOTE — Anesthesia Procedure Notes (Signed)
Procedure Name: Intubation Date/Time: 01/10/2019 12:41 PM Performed by: Glory Buff, CRNA Pre-anesthesia Checklist: Patient identified, Emergency Drugs available, Suction available and Patient being monitored Patient Re-evaluated:Patient Re-evaluated prior to induction Oxygen Delivery Method: Circle system utilized Preoxygenation: Pre-oxygenation with 100% oxygen Induction Type: IV induction Ventilation: Mask ventilation without difficulty Laryngoscope Size: Miller and 3 Grade View: Grade I Tube type: Oral Tube size: 7.5 mm Number of attempts: 1 Airway Equipment and Method: Stylet and Oral airway Placement Confirmation: ETT inserted through vocal cords under direct vision,  positive ETCO2 and breath sounds checked- equal and bilateral Secured at: 22 cm Tube secured with: Tape Dental Injury: Teeth and Oropharynx as per pre-operative assessment

## 2019-01-10 NOTE — Transfer of Care (Signed)
Immediate Anesthesia Transfer of Care Note  Patient: Steven Peck  Procedure(s) Performed: XI ROBOTIC ASSISTED LAPAROSCOPIC NEPHRECTOMY (Left ) CYSTOSCOPY (N/A )  Patient Location: PACU  Anesthesia Type:General  Level of Consciousness: awake, alert  and oriented  Airway & Oxygen Therapy: Patient Spontanous Breathing and Patient connected to face mask oxygen  Post-op Assessment: Report given to RN and Post -op Vital signs reviewed and stable  Post vital signs: Reviewed and stable  Last Vitals:  Vitals Value Taken Time  BP    Temp    Pulse    Resp    SpO2      Last Pain:  Vitals:   01/10/19 1008  TempSrc: Oral  PainSc: 7          Complications: No apparent anesthesia complications

## 2019-01-10 NOTE — Anesthesia Preprocedure Evaluation (Addendum)
Anesthesia Evaluation  Patient identified by MRN, date of birth, ID band Patient awake    Reviewed: Allergy & Precautions, NPO status , Patient's Chart, lab work & pertinent test results  Airway Mallampati: II  TM Distance: >3 FB Neck ROM: Full    Dental  (+) Dental Advisory Given   Pulmonary asthma ,    breath sounds clear to auscultation       Cardiovascular hypertension, Pt. on medications  Rhythm:Regular Rate:Normal     Neuro/Psych negative neurological ROS     GI/Hepatic negative GI ROS, Neg liver ROS,   Endo/Other  negative endocrine ROS  Renal/GU Renal cancer      Musculoskeletal   Abdominal   Peds  Hematology  (+) anemia ,   Anesthesia Other Findings   Reproductive/Obstetrics                             Lab Results  Component Value Date   WBC 7.4 01/05/2019   HGB 8.6 (L) 01/05/2019   HCT 29.7 (L) 01/05/2019   MCV 79.8 (L) 01/05/2019   PLT 355 01/05/2019   Lab Results  Component Value Date   CREATININE 1.10 01/05/2019   BUN 14 01/05/2019   NA 137 01/05/2019   K 4.1 01/05/2019   CL 102 01/05/2019   CO2 26 01/05/2019    Anesthesia Physical Anesthesia Plan  ASA: III  Anesthesia Plan: General   Post-op Pain Management:    Induction: Intravenous  PONV Risk Score and Plan: 2 and Dexamethasone, Ondansetron and Treatment may vary due to age or medical condition  Airway Management Planned: Oral ETT  Additional Equipment:   Intra-op Plan:   Post-operative Plan: Extubation in OR  Informed Consent: I have reviewed the patients History and Physical, chart, labs and discussed the procedure including the risks, benefits and alternatives for the proposed anesthesia with the patient or authorized representative who has indicated his/her understanding and acceptance.     Dental advisory given  Plan Discussed with: CRNA  Anesthesia Plan Comments:          Anesthesia Quick Evaluation

## 2019-01-10 NOTE — Anesthesia Postprocedure Evaluation (Signed)
Anesthesia Post Note  Patient: Nagi Furio  Procedure(s) Performed: XI ROBOTIC ASSISTED LAPAROSCOPIC NEPHRECTOMY (Left ) CYSTOSCOPY (N/A )     Patient location during evaluation: PACU Anesthesia Type: General Level of consciousness: awake and alert Pain management: pain level controlled Vital Signs Assessment: post-procedure vital signs reviewed and stable Respiratory status: spontaneous breathing, nonlabored ventilation, respiratory function stable and patient connected to nasal cannula oxygen Cardiovascular status: blood pressure returned to baseline and stable Postop Assessment: no apparent nausea or vomiting Anesthetic complications: no    Last Vitals:  Vitals:   01/10/19 1715 01/10/19 1728  BP: (!) 162/100 (!) 163/100  Pulse: 92 87  Resp: 18 16  Temp: 36.5 C 36.8 C  SpO2: 100% 100%    Last Pain:  Vitals:   01/10/19 1809  TempSrc:   PainSc: 8                  Tiajuana Amass

## 2019-01-10 NOTE — H&P (Signed)
Steven Peck is an 53 y.o. male.    Chief Complaint: Pre-op LEFT Radical Nephrectomy  HPI:   1 - Left Renal Cancer - 7cm left upper pole infiltrative mass by CT, PET, BX 10/2018 on eval flank pain, weight loss, hypercalcemia, anemia, hematuria. BX-proven clear cell. CT and PET with some left adrenal equivocal uptake (I suspect early met), otherwise no distant disease. 1 artery / 1 vein left renovascular anatomy. Normal contralteral kidney. Cr 0.9. Hypoatenuating splenic lesion noted x several but NOT PET avid.   2 - Gross Hematuria - new gross hematuria 09/2018. Large left renal mass as per above. Cysto pending Non smoker.   3 - Prostate Screening - No FHX prostate cancer  11/2018 - PSA 0.3 / DRE 40gm smooth.   4 - Anemia - Hgb 8s on eval gross hematuria, now Fe replacement and Hgb 8.5-10s.   5 - Cancer-Related Pain - on oxycodone per medical oncology for left flank pain. He has no obvious surrounding tissue invasion from mass, but does have some clot passage.   PMH sig for Gout (very rare, no baseline meds). His PCP is [name] NP with [facility] in [place]. He also has seen [name] MD with [facility]. He is Games developer in Occupational hygienist. His fiancee Cherylene Cheek is also involved.   Today " Steven Peck " is seen to proceed with LEFT radical nephrectomy for left renal neoplasm. No interval fevers. Most recent Hgb 8.6.      Past Medical History:  Diagnosis Date  . Anemia   . Asthma    as a child  . Cancer ([place])    Left renal cancer  . Constipation   . Hypertension     Past Surgical History:  Procedure Laterality Date  . NO PAST SURGERIES      No family history on file. Social History:  reports that he has never smoked. He has never used smokeless tobacco. He reports previous alcohol use. He reports that he does not use drugs.  Allergies:  Allergies  Allergen Reactions  . Lisinopril Swelling    Angioedema of lips     No medications prior to admission.    No results found for this or any previous visit (from the past 48 hour(s)). No results found.  Review of Systems  Constitutional: Positive for malaise/fatigue. Negative for chills and fever.  Genitourinary: Positive for flank pain and hematuria.  All other systems reviewed and are negative.   There were no vitals taken for this visit. Physical Exam  Constitutional: He appears well-developed.  Blunted affect, at baseline  HENT:  Head: Normocephalic.  Eyes: Pupils are equal, round, and reactive to light.  Neck: Normal range of motion.  Cardiovascular: Normal rate.  Respiratory: Effort normal.  GI: Soft.  Genitourinary:    Genitourinary Comments: Minimal CVAT at present.    Musculoskeletal: Normal range of motion.  Neurological: He is alert.  Skin: Skin is warm.     Assessment/Plan  Proceed as planned with LEFT robotic radical nephrectomy / adrenalectomy and cysto with curative intent. Risks, benefits, alternatives, expected peri-op course discussed previously and reiterated today.   Alexis Frock, MD 01/10/2019, 6:38 AM

## 2019-01-10 NOTE — Brief Op Note (Signed)
01/10/2019  3:45 PM  PATIENT:  Steven Peck  53 y.o. male  PRE-OPERATIVE DIAGNOSIS:  LEFT RENAL CANCER, HEMATURIA  POST-OPERATIVE DIAGNOSIS:  LEFT RENAL CANCER, HEMATURIA  PROCEDURE:  Procedure(s) with comments: XI ROBOTIC ASSISTED LAPAROSCOPIC NEPHRECTOMY (Left) - 3 HRS CYSTOSCOPY (N/A)  SURGEON:  Surgeon(s) and Role:    Alexis Frock, MD - Primary  PHYSICIAN ASSISTANT:   ASSISTANTS: Clemetine Marker PA   ANESTHESIA:   local and general  EBL:  150 mL   BLOOD ADMINISTERED:1 unit CC PRBC  DRAINS: foley to gravity   LOCAL MEDICATIONS USED:  MARCAINE     SPECIMEN:  Source of Specimen:  left radical nephrectomy  DISPOSITION OF SPECIMEN:  PATHOLOGY  COUNTS:  YES  TOURNIQUET:  * No tourniquets in log *  DICTATION: .Other Dictation: Dictation Number  [id number]  PLAN OF CARE: Admit to inpatient   PATIENT DISPOSITION:  PACU - hemodynamically stable.   Delay start of Pharmacological VTE agent (>24hrs) due to surgical blood loss or risk of bleeding: yes

## 2019-01-11 ENCOUNTER — Encounter (HOSPITAL_COMMUNITY): Payer: Self-pay | Admitting: Urology

## 2019-01-11 LAB — BASIC METABOLIC PANEL
Anion gap: 9 (ref 5–15)
BUN: 12 mg/dL (ref 6–20)
CO2: 26 mmol/L (ref 22–32)
Calcium: 8.8 mg/dL — ABNORMAL LOW (ref 8.9–10.3)
Chloride: 101 mmol/L (ref 98–111)
Creatinine, Ser: 1.17 mg/dL (ref 0.61–1.24)
GFR calc Af Amer: >60 mL/min (ref >60)
GFR calc non Af Amer: >60 mL/min (ref >60)
Glucose, Bld: 120 mg/dL — ABNORMAL HIGH (ref 70–99)
Potassium: 4.1 mmol/L (ref 3.5–5.1)
Sodium: 136 mmol/L (ref 135–145)

## 2019-01-11 LAB — HEMOGLOBIN AND HEMATOCRIT, BLOOD
HCT: 28.7 % — ABNORMAL LOW (ref 39.0–52.0)
Hemoglobin: 8.4 g/dL — ABNORMAL LOW (ref 13.0–17.0)

## 2019-01-11 MED ORDER — POLYETHYLENE GLYCOL 3350 17 G PO PACK
17.0000 g | PACK | Freq: Every day | ORAL | Status: DC
  Administered 2019-01-11 – 2019-01-12 (×2): 17 g via ORAL
  Filled 2019-01-11 (×2): qty 1

## 2019-01-11 MED ORDER — OXYCODONE HCL 5 MG PO TABS
5.0000 mg | ORAL_TABLET | ORAL | Status: DC | PRN
  Administered 2019-01-11 – 2019-01-12 (×9): 10 mg via ORAL
  Filled 2019-01-11 (×9): qty 2

## 2019-01-11 NOTE — Progress Notes (Signed)
Urology Progress Note   1 Day Post-Op status post robotic left nephrectomy for potentially locally advanced renal cell carcinoma.  Subjective: NAEON.  Had something around 2 AM this morning but it is tolerable at this time.  Ambulated once yesterday.  No nausea or vomiting  Objective: Vital signs in last 24 hours: Temp:  [97.7 F (36.5 C)-99.1 F (37.3 C)] 98.6 F (37 C) (08/13 0550) Pulse Rate:  [85-105] 88 (08/13 0550) Resp:  [7-21] 18 (08/13 0550) BP: (137-163)/(89-111) 142/97 (08/13 0550) SpO2:  [98 %-100 %] 99 % (08/13 0550) Weight:  [103.9 kg-222.9 kg] 222.9 kg (08/12 1728)  Intake/Output from previous day: 08/12 0701 - 08/13 0700 In: 4274 [P.O.:480; I.V.:3379; Blood:315; IV Piggyback:100] Out: 5638 [Urine:1635; Blood:150] Intake/Output this shift: No intake/output data recorded.  Physical Exam:  General: Alert and oriented CV: RRR Lungs: Clear Abdomen: Soft, appropriately tender.  GU: Foley in place draining clear yellow urine  Ext: NT, No erythema  Lab Results: Recent Labs    01/10/19 1040 01/10/19 1627 01/11/19 0508  HGB 9.2* 8.8* 8.4*  HCT 31.2* 29.4* 28.7*   BMET No results for input(s): NA, K, CL, CO2, GLUCOSE, BUN, CREATININE, CALCIUM in the last 72 hours.   Studies/Results: No results found.  Assessment/Plan:  53 y.o. male s/p left nephrectomy.  Overall doing well post-op.   -DC fluids -DC Foley -Regular diet -Bowel regimen -If doing well by this afternoon will plan for discharge home  Dispo: floor   LOS: 1 day   Tharon Aquas 01/11/2019, 7:24 AM

## 2019-01-11 NOTE — Op Note (Signed)
NAMEKHARY, SCHABEN MEDICAL RECORD UJ:[id number] ACCOUNT [id number] DATE OF BIRTH:06-02-1965 FACILITY: [facility] LOCATION: [facility] PHYSICIAN:Jurni Cesaro, MD  OPERATIVE REPORT  DATE OF PROCEDURE:  01/10/2019  PREOPERATIVE DIAGNOSIS:  Large left renal mass, suspect locally advanced.  PROCEDURE: 1.  Cystoscopy. 2.  Left robotic radical nephrectomy.   COMPLICATIONS:  None.  SPECIMENS:  Left kidney plus adrenal gland plus periaortic lymph nodes en bloc for permanent pathology.  ASSISTANT:  Debbrah Alar, PA  FINDINGS: 1.  Unremarkable urinary bladder with cystoscopy. 2.  Two artery, one vein, left renal vascular anatomy. 3.  Significant desmoplastic reaction around the left kidney concerning for locally advanced disease and urethral dilation.  BLOOD TRANSFUSION:  1 unit.  INDICATIONS:  The patient is a pleasant but unfortunate 53 year old man who was found on workup of gross hematuria and flank pain to have a large left upper pole renal mass with questionable ipsilateral adrenal metastasis.  This is biopsy proven renal  cell carcinoma.  He was referred for definitive management.  He did not have any distant disease or bulky adenopathy. Options were discussed including nephrectomy with hopeful curative intent and he wished to proceed.  He does have a history of gross  hematuria with anemia that has been mildly symptomatic and has not yet undergone cystoscopy for today.  Informed consent was obtained and placed in medical record.  DESCRIPTION OF PROCEDURE:  The patient was identified.  The procedure being cystoscopy and left robotic radical nephrectomy was confirmed.  Procedure timeout was performed.  Intravenous antibiotics administered.  General endotracheal anesthesia induced.   The patient was placed into a left side up full flank position with plane 15 degrees of table flexion superior elevator axillary roll sequential pressure devices, bottom leg bent, top leg straight.  He was  further fastened to the table using 3-inch tape  over foam padding across the supraxiphoid chest and his pelvis.  Axillary roll was used.  A superior elevator was used.  Sterile field was created prepping the patient's entire left flank using chlorhexidine gluconate after clipper shaving and after a  cystoscopy was performed.  Cystourethroscopy was performed using a 16-French flexible cystoscope.  The anterior and posterior urethra was unremarkable.  Inspection of bladder revealed no diverticula, calcifications or papillary lesions.  Ureteral orifices appeared singleton  bilaterally.  Retroflexion revealed no additional findings.  The scope was removed and exchanged for a 16-French Foley catheter to straight drain.  Next, a high-flow, low-pressure pneumoperitoneum was obtained with Veress technique in the left upper  quadrant, having passed the aspiration and drop test.  An 8 mm robotic camera port was placed in position approximately 1 handbreadth superior bilateral to the umbilicus.  Laparoscopic examination of the peritoneal cavity revealed no significant  adhesions, no visceral injury.  Distal ports were placed as follows:  Left subcostal 8 mm robotic port, left far lateral 8 mm robotic port approximately 4 fingerbreadths superior and medial to the anterior iliac spine, left inferior paramedian robotic  port and two 12 mm assistant port sites to midline, one in the supraumbilical crease and another 2 fingerbreadths above the plane of the camera port.  Robot was docked and passed electronic checks.  Initial attention was directed at development of the  left retroperitoneum.  Incision was made lateral to the descending colon from the area of the splenic flexure towards the internal ring.  The colon was carefully swept medially.  There was significant desmoplastic reaction all around Gerota's fascia  consistent with aggressive cancer.  This  made the colonic mesentery exquisitely adherent to this and this  was dissected free, taking exquisite care to avoid any devascularization of the colonic mesentery.  The lower pole of the kidney area was  identified, placed on gentle lateral traction.  Dissection proceeded medial to this, a very abnormally large gonadal vein and ureter were encountered.  These were lysed on gentle lateral traction.  Dissection procedures superior within the triangle of  these structures and the psoas musculature towards the  hilum.  A plane was chosen directly at the level of the aorta thus performing peri-aortic node dissection inherently.  There was a lower pole accessory artery that was controlled using an extra-large clip each side.  Main hilum  consisted of single artery, single vein and renovascular anatomy with the artery entering relatively superior orientation relative to the vein.  The artery was controlled using an extra-large clip proximally followed by a vascular stapler distally and  the vein was controlled using vascular stapler.  The plane was then chosen superiorly, thus performing total adrenalectomy en bloc with the kidney.  Lateral attachments were taken down with cautery scissors.    The left radical nephrectomy specimen was placed into an extra-large EndoCatch bag.  The abdomen was once again inspected.  Hemostasis appeared excellent.  Sponge, needle counts were correct.  We achieved the goals of surgery today.   Robot was then undocked.  The specimen was retrieved by connecting the 2 previous assistant port sites in the midline, completely removing the left nephrctomy specimen setting aside for pathology.  Extraction site was closed with fascia using figure-of-eight  PDS x6 followed by reapproximation of Scarpa's with a running Vicryl.  All incision sites were infiltrated with dilute lipolyzed Marcaine and closed level of skin using subcuticular Monocryl followed by Dermabond.  Procedure terminated.  The patient tolerated  the procedure well.  No immediate  perioperative complications.  The patient was then in stable condition with plan for hospital admission postoperatively.  Notably, due to his preoperative anemia, he did receive 1 unit of packed red blood cells at the  beginning of surgery given relatively minimal blood loss, did not feel additional transfusion was warranted at the conclusion of the surgery.  TN/NUANCE  D:01/10/2019 T:01/11/2019 JOB:[id number]

## 2019-01-12 NOTE — Plan of Care (Signed)
  Problem: Health Behavior/Discharge Planning: Goal: Ability to manage health-related needs will improve Outcome: Progressing   Problem: Activity: Goal: Risk for activity intolerance will decrease Outcome: Progressing   Problem: Nutrition: Goal: Adequate nutrition will be maintained Outcome: Progressing   Problem: Elimination: Goal: Will not experience complications related to bowel motility Outcome: Progressing   Problem: Pain Managment: Goal: General experience of comfort will improve Outcome: Progressing   Problem: Clinical Measurements: Goal: Postoperative complications will be avoided or minimized Outcome: Progressing   Problem: Health Behavior/Discharge Planning: Goal: Ability to manage health-related needs will improve Outcome: Progressing   Problem: Activity: Goal: Risk for activity intolerance will decrease Outcome: Progressing   Problem: Nutrition: Goal: Adequate nutrition will be maintained Outcome: Progressing   Problem: Elimination: Goal: Will not experience complications related to bowel motility Outcome: Progressing   Problem: Pain Managment: Goal: General experience of comfort will improve Outcome: Progressing   Problem: Clinical Measurements: Goal: Postoperative complications will be avoided or minimized Outcome: Progressing

## 2019-01-12 NOTE — Discharge Summary (Signed)
[facility] Discharge Summary  Admit date: 01/10/2019  Discharge date and time: 01/12/19   Discharge to: Home  Discharge Service: Urology  Discharge Attending Physician:  [name]  Discharge  Diagnoses: Left renal mass  OR Procedures: Procedure(s): XI ROBOTIC ASSISTED LAPAROSCOPIC NEPHRECTOMY CYSTOSCOPY 01/10/2019   Ancillary Procedures: None   Discharge Day Services: The patient was seen and examined by the Urology team both in the morning and immediately prior to discharge.  Vital signs and laboratory values were stable and within normal limits.  The physical exam was benign and unchanged and all surgical wounds were examined.  Discharge instructions were explained and all questions answered.  Subjective  No acute events overnight. Pain Controlled. No fever or chills.  Voiding volitionally  Objective Patient Vitals for the past 8 hrs:  BP Temp Temp src Pulse Resp SpO2  01/12/19 0637 (!) 143/97 98.8 F (37.1 C) Oral 97 17 100 %   No intake/output data recorded.  General Appearance:        No acute distress Lungs:                       Normal work of breathing on room air Heart:                                Regular rate and rhythm Abdomen:                         Soft, non-tender, non-distended.  Incisions clean dry intact with Dermabond.  No drain Extremities:                      Warm and well perfused   Hospital Course:  The patient underwent left robotic nephrectomy on 01/10/2019.  Procedure was difficult due to what appeared to be locally advanced disease distorting tissue planes and adhesions.    The patient tolerated the procedure well, was extubated in the OR, and afterwards was taken to the PACU for routine post-surgical care. When stable the patient was transferred to the floor.     The patient did well postoperatively.  He was normalized on postoperative day 1 but stayed in the hospital and extra day to 2 pain control.   The patient was discharged home 2  Days Post-Op, at which point was tolerating a regular solid diet, was able to void spontaneously, have adequate pain control with P.O. pain medication, and could ambulate without difficulty. The patient will follow up with [place] for post op check in 2 weeks.  Task sent to urology scheduler to call the patient.  Condition at Discharge: Improved  Discharge Medications:  Allergies as of 01/12/2019      Reactions   Lisinopril Swelling   Angioedema of lips      Medication List    TAKE these medications   lactulose 10 GM/15ML solution Commonly known as: CHRONULAC Take 10 g by mouth at bedtime as needed for mild constipation.   olmesartan 20 MG tablet Commonly known as: BENICAR Take 20 mg by mouth daily.   Oxycodone HCl 10 MG Tabs Take 1 tablet (10 mg total) by mouth every 4 (four) hours as needed (pain).

## 2019-01-12 NOTE — Progress Notes (Signed)
Pt discharged home today per Dr. Gino Sar. Pt's IV site D/C'd and WDL. Pt's VSS. Pt provided with home medication list, discharge instructions and prescriptions. Verbalized understanding. Pt left floor via WC in stable condition accompanied by RN and family.

## 2019-01-12 NOTE — Discharge Instructions (Signed)

## 2019-01-14 LAB — BPAM RBC
Blood Product Expiration Date: 202009092359
Blood Product Expiration Date: 202009092359
ISSUE DATE / TIME: 202008121222
ISSUE DATE / TIME: 202008121222
Unit Type and Rh: 5100
Unit Type and Rh: 5100

## 2019-01-14 LAB — TYPE AND SCREEN
ABO/RH(D): O POS
Antibody Screen: NEGATIVE
Unit division: 0
Unit division: 0

## 2019-01-19 DIAGNOSIS — E559 Vitamin D deficiency, unspecified: Secondary | ICD-10-CM | POA: Insufficient documentation

## 2019-01-25 DIAGNOSIS — Z905 Acquired absence of kidney: Secondary | ICD-10-CM | POA: Diagnosis not present

## 2019-01-31 DIAGNOSIS — E538 Deficiency of other specified B group vitamins: Secondary | ICD-10-CM | POA: Diagnosis not present

## 2019-01-31 DIAGNOSIS — D519 Vitamin B12 deficiency anemia, unspecified: Secondary | ICD-10-CM | POA: Diagnosis not present

## 2019-01-31 DIAGNOSIS — D649 Anemia, unspecified: Secondary | ICD-10-CM | POA: Diagnosis not present

## 2019-01-31 DIAGNOSIS — C642 Malignant neoplasm of left kidney, except renal pelvis: Secondary | ICD-10-CM | POA: Diagnosis not present

## 2019-01-31 DIAGNOSIS — E559 Vitamin D deficiency, unspecified: Secondary | ICD-10-CM | POA: Diagnosis not present

## 2019-01-31 DIAGNOSIS — D509 Iron deficiency anemia, unspecified: Secondary | ICD-10-CM | POA: Diagnosis not present

## 2019-02-23 DIAGNOSIS — D519 Vitamin B12 deficiency anemia, unspecified: Secondary | ICD-10-CM | POA: Diagnosis not present

## 2019-02-23 DIAGNOSIS — D649 Anemia, unspecified: Secondary | ICD-10-CM | POA: Diagnosis not present

## 2019-02-23 DIAGNOSIS — E538 Deficiency of other specified B group vitamins: Secondary | ICD-10-CM | POA: Diagnosis not present

## 2019-02-23 DIAGNOSIS — D509 Iron deficiency anemia, unspecified: Secondary | ICD-10-CM | POA: Diagnosis not present

## 2019-02-23 DIAGNOSIS — C642 Malignant neoplasm of left kidney, except renal pelvis: Secondary | ICD-10-CM | POA: Diagnosis not present

## 2019-02-23 DIAGNOSIS — I10 Essential (primary) hypertension: Secondary | ICD-10-CM | POA: Diagnosis not present

## 2019-02-23 DIAGNOSIS — E559 Vitamin D deficiency, unspecified: Secondary | ICD-10-CM | POA: Diagnosis not present

## 2019-03-01 DIAGNOSIS — Z23 Encounter for immunization: Secondary | ICD-10-CM | POA: Diagnosis not present

## 2019-03-01 DIAGNOSIS — C642 Malignant neoplasm of left kidney, except renal pelvis: Secondary | ICD-10-CM | POA: Diagnosis not present

## 2019-03-22 DIAGNOSIS — Z85528 Personal history of other malignant neoplasm of kidney: Secondary | ICD-10-CM | POA: Diagnosis not present

## 2019-03-22 DIAGNOSIS — N189 Chronic kidney disease, unspecified: Secondary | ICD-10-CM | POA: Diagnosis not present

## 2019-03-22 DIAGNOSIS — I10 Essential (primary) hypertension: Secondary | ICD-10-CM | POA: Diagnosis not present

## 2019-03-22 DIAGNOSIS — N289 Disorder of kidney and ureter, unspecified: Secondary | ICD-10-CM | POA: Diagnosis not present

## 2019-03-22 DIAGNOSIS — N179 Acute kidney failure, unspecified: Secondary | ICD-10-CM | POA: Diagnosis not present

## 2019-03-27 DIAGNOSIS — C642 Malignant neoplasm of left kidney, except renal pelvis: Secondary | ICD-10-CM | POA: Diagnosis not present

## 2019-03-27 DIAGNOSIS — D509 Iron deficiency anemia, unspecified: Secondary | ICD-10-CM | POA: Diagnosis not present

## 2019-03-28 DIAGNOSIS — C642 Malignant neoplasm of left kidney, except renal pelvis: Secondary | ICD-10-CM | POA: Diagnosis not present

## 2019-03-30 DIAGNOSIS — N433 Hydrocele, unspecified: Secondary | ICD-10-CM | POA: Diagnosis not present

## 2019-03-30 DIAGNOSIS — C642 Malignant neoplasm of left kidney, except renal pelvis: Secondary | ICD-10-CM | POA: Diagnosis not present

## 2019-04-05 DIAGNOSIS — E559 Vitamin D deficiency, unspecified: Secondary | ICD-10-CM | POA: Diagnosis not present

## 2019-04-05 DIAGNOSIS — D509 Iron deficiency anemia, unspecified: Secondary | ICD-10-CM | POA: Diagnosis not present

## 2019-04-05 DIAGNOSIS — D72819 Decreased white blood cell count, unspecified: Secondary | ICD-10-CM | POA: Diagnosis not present

## 2019-04-05 DIAGNOSIS — C642 Malignant neoplasm of left kidney, except renal pelvis: Secondary | ICD-10-CM | POA: Diagnosis not present

## 2019-04-05 DIAGNOSIS — I10 Essential (primary) hypertension: Secondary | ICD-10-CM | POA: Diagnosis not present

## 2019-04-09 DIAGNOSIS — I10 Essential (primary) hypertension: Secondary | ICD-10-CM | POA: Diagnosis not present

## 2019-04-09 DIAGNOSIS — Z6834 Body mass index (BMI) 34.0-34.9, adult: Secondary | ICD-10-CM | POA: Diagnosis not present

## 2019-04-12 DIAGNOSIS — C642 Malignant neoplasm of left kidney, except renal pelvis: Secondary | ICD-10-CM | POA: Diagnosis not present

## 2019-04-12 DIAGNOSIS — D509 Iron deficiency anemia, unspecified: Secondary | ICD-10-CM | POA: Diagnosis not present

## 2019-04-12 DIAGNOSIS — I10 Essential (primary) hypertension: Secondary | ICD-10-CM | POA: Diagnosis not present

## 2019-04-12 DIAGNOSIS — D72819 Decreased white blood cell count, unspecified: Secondary | ICD-10-CM | POA: Diagnosis not present

## 2019-04-12 DIAGNOSIS — E559 Vitamin D deficiency, unspecified: Secondary | ICD-10-CM | POA: Diagnosis not present

## 2019-04-23 DIAGNOSIS — I10 Essential (primary) hypertension: Secondary | ICD-10-CM | POA: Diagnosis not present

## 2019-05-07 DIAGNOSIS — I10 Essential (primary) hypertension: Secondary | ICD-10-CM | POA: Diagnosis not present

## 2019-05-11 DIAGNOSIS — I10 Essential (primary) hypertension: Secondary | ICD-10-CM | POA: Diagnosis not present

## 2019-05-11 DIAGNOSIS — C642 Malignant neoplasm of left kidney, except renal pelvis: Secondary | ICD-10-CM | POA: Diagnosis not present

## 2019-05-11 DIAGNOSIS — D509 Iron deficiency anemia, unspecified: Secondary | ICD-10-CM | POA: Diagnosis not present

## 2019-05-11 DIAGNOSIS — D72819 Decreased white blood cell count, unspecified: Secondary | ICD-10-CM | POA: Diagnosis not present

## 2019-07-18 ENCOUNTER — Ambulatory Visit (HOSPITAL_COMMUNITY)
Admission: RE | Admit: 2019-07-18 | Discharge: 2019-07-18 | Disposition: A | Discharge status: Home / Self Care | Payer: BC Managed Care – PPO | Source: Ambulatory Visit | Attending: Urology | Admitting: Urology

## 2019-07-18 ENCOUNTER — Other Ambulatory Visit (HOSPITAL_COMMUNITY): Payer: Self-pay | Admitting: Urology

## 2019-07-18 ENCOUNTER — Other Ambulatory Visit: Payer: Self-pay

## 2019-07-18 DIAGNOSIS — C642 Malignant neoplasm of left kidney, except renal pelvis: Secondary | ICD-10-CM

## 2019-07-18 DIAGNOSIS — Z85528 Personal history of other malignant neoplasm of kidney: Secondary | ICD-10-CM | POA: Diagnosis not present

## 2019-07-23 DIAGNOSIS — C642 Malignant neoplasm of left kidney, except renal pelvis: Secondary | ICD-10-CM | POA: Diagnosis not present

## 2019-07-23 DIAGNOSIS — C7801 Secondary malignant neoplasm of right lung: Secondary | ICD-10-CM | POA: Diagnosis not present

## 2019-07-23 DIAGNOSIS — Z905 Acquired absence of kidney: Secondary | ICD-10-CM | POA: Diagnosis not present

## 2019-07-26 DIAGNOSIS — D649 Anemia, unspecified: Secondary | ICD-10-CM | POA: Diagnosis not present

## 2019-07-26 DIAGNOSIS — I10 Essential (primary) hypertension: Secondary | ICD-10-CM | POA: Diagnosis not present

## 2019-07-26 DIAGNOSIS — C642 Malignant neoplasm of left kidney, except renal pelvis: Secondary | ICD-10-CM | POA: Diagnosis not present

## 2019-07-26 DIAGNOSIS — E559 Vitamin D deficiency, unspecified: Secondary | ICD-10-CM | POA: Diagnosis not present

## 2019-07-30 DIAGNOSIS — R918 Other nonspecific abnormal finding of lung field: Secondary | ICD-10-CM | POA: Diagnosis not present

## 2019-07-30 DIAGNOSIS — C642 Malignant neoplasm of left kidney, except renal pelvis: Secondary | ICD-10-CM | POA: Diagnosis not present

## 2019-08-07 DIAGNOSIS — Z20822 Contact with and (suspected) exposure to covid-19: Secondary | ICD-10-CM | POA: Diagnosis not present

## 2019-08-10 ENCOUNTER — Ambulatory Visit: Payer: BC Managed Care – PPO

## 2019-08-10 DIAGNOSIS — C7801 Secondary malignant neoplasm of right lung: Secondary | ICD-10-CM | POA: Diagnosis not present

## 2019-08-10 DIAGNOSIS — Z452 Encounter for adjustment and management of vascular access device: Secondary | ICD-10-CM | POA: Diagnosis not present

## 2019-08-10 DIAGNOSIS — C649 Malignant neoplasm of unspecified kidney, except renal pelvis: Secondary | ICD-10-CM | POA: Diagnosis not present

## 2019-08-13 DIAGNOSIS — Z5111 Encounter for antineoplastic chemotherapy: Secondary | ICD-10-CM | POA: Diagnosis not present

## 2019-08-13 DIAGNOSIS — C642 Malignant neoplasm of left kidney, except renal pelvis: Secondary | ICD-10-CM | POA: Diagnosis not present

## 2019-08-13 DIAGNOSIS — Z79899 Other long term (current) drug therapy: Secondary | ICD-10-CM | POA: Diagnosis not present

## 2019-08-23 DIAGNOSIS — C642 Malignant neoplasm of left kidney, except renal pelvis: Secondary | ICD-10-CM | POA: Diagnosis not present

## 2019-08-23 DIAGNOSIS — C7800 Secondary malignant neoplasm of unspecified lung: Secondary | ICD-10-CM | POA: Insufficient documentation

## 2019-09-04 DIAGNOSIS — D649 Anemia, unspecified: Secondary | ICD-10-CM | POA: Diagnosis not present

## 2019-09-04 DIAGNOSIS — C7800 Secondary malignant neoplasm of unspecified lung: Secondary | ICD-10-CM | POA: Diagnosis not present

## 2019-09-04 DIAGNOSIS — C642 Malignant neoplasm of left kidney, except renal pelvis: Secondary | ICD-10-CM | POA: Diagnosis not present

## 2019-09-04 DIAGNOSIS — R21 Rash and other nonspecific skin eruption: Secondary | ICD-10-CM | POA: Diagnosis not present

## 2019-09-04 DIAGNOSIS — E860 Dehydration: Secondary | ICD-10-CM | POA: Diagnosis not present

## 2019-09-04 DIAGNOSIS — I10 Essential (primary) hypertension: Secondary | ICD-10-CM | POA: Diagnosis not present

## 2019-09-04 DIAGNOSIS — E559 Vitamin D deficiency, unspecified: Secondary | ICD-10-CM | POA: Diagnosis not present

## 2019-09-05 DIAGNOSIS — D509 Iron deficiency anemia, unspecified: Secondary | ICD-10-CM | POA: Diagnosis not present

## 2019-09-05 DIAGNOSIS — Z5111 Encounter for antineoplastic chemotherapy: Secondary | ICD-10-CM | POA: Diagnosis not present

## 2019-09-05 DIAGNOSIS — D519 Vitamin B12 deficiency anemia, unspecified: Secondary | ICD-10-CM | POA: Diagnosis not present

## 2019-09-05 DIAGNOSIS — C642 Malignant neoplasm of left kidney, except renal pelvis: Secondary | ICD-10-CM | POA: Diagnosis not present

## 2019-09-24 DIAGNOSIS — Z79899 Other long term (current) drug therapy: Secondary | ICD-10-CM | POA: Diagnosis not present

## 2019-09-24 DIAGNOSIS — C642 Malignant neoplasm of left kidney, except renal pelvis: Secondary | ICD-10-CM | POA: Diagnosis not present

## 2019-09-25 DIAGNOSIS — Z5111 Encounter for antineoplastic chemotherapy: Secondary | ICD-10-CM | POA: Diagnosis not present

## 2019-09-25 DIAGNOSIS — C642 Malignant neoplasm of left kidney, except renal pelvis: Secondary | ICD-10-CM | POA: Diagnosis not present

## 2019-09-25 DIAGNOSIS — Z79899 Other long term (current) drug therapy: Secondary | ICD-10-CM | POA: Diagnosis not present

## 2019-10-15 DIAGNOSIS — D519 Vitamin B12 deficiency anemia, unspecified: Secondary | ICD-10-CM | POA: Diagnosis not present

## 2019-10-15 DIAGNOSIS — D509 Iron deficiency anemia, unspecified: Secondary | ICD-10-CM | POA: Diagnosis not present

## 2019-10-15 DIAGNOSIS — C642 Malignant neoplasm of left kidney, except renal pelvis: Secondary | ICD-10-CM | POA: Diagnosis not present

## 2019-10-15 DIAGNOSIS — Z79899 Other long term (current) drug therapy: Secondary | ICD-10-CM | POA: Diagnosis not present

## 2019-10-16 DIAGNOSIS — C642 Malignant neoplasm of left kidney, except renal pelvis: Secondary | ICD-10-CM | POA: Diagnosis not present

## 2019-11-05 DIAGNOSIS — I10 Essential (primary) hypertension: Secondary | ICD-10-CM | POA: Diagnosis not present

## 2019-11-05 DIAGNOSIS — C7800 Secondary malignant neoplasm of unspecified lung: Secondary | ICD-10-CM | POA: Diagnosis not present

## 2019-11-05 DIAGNOSIS — C642 Malignant neoplasm of left kidney, except renal pelvis: Secondary | ICD-10-CM | POA: Diagnosis not present

## 2019-11-05 DIAGNOSIS — M25461 Effusion, right knee: Secondary | ICD-10-CM | POA: Diagnosis not present

## 2019-11-05 DIAGNOSIS — M25462 Effusion, left knee: Secondary | ICD-10-CM | POA: Diagnosis not present

## 2019-11-05 DIAGNOSIS — E559 Vitamin D deficiency, unspecified: Secondary | ICD-10-CM | POA: Diagnosis not present

## 2019-11-06 DIAGNOSIS — M7989 Other specified soft tissue disorders: Secondary | ICD-10-CM | POA: Diagnosis not present

## 2019-11-06 DIAGNOSIS — M79601 Pain in right arm: Secondary | ICD-10-CM | POA: Diagnosis not present

## 2019-11-06 DIAGNOSIS — I82621 Acute embolism and thrombosis of deep veins of right upper extremity: Secondary | ICD-10-CM | POA: Diagnosis not present

## 2019-11-06 DIAGNOSIS — C642 Malignant neoplasm of left kidney, except renal pelvis: Secondary | ICD-10-CM | POA: Diagnosis not present

## 2019-11-07 DIAGNOSIS — Z20828 Contact with and (suspected) exposure to other viral communicable diseases: Secondary | ICD-10-CM | POA: Diagnosis not present

## 2019-11-07 DIAGNOSIS — R0981 Nasal congestion: Secondary | ICD-10-CM | POA: Diagnosis not present

## 2019-11-09 DIAGNOSIS — C642 Malignant neoplasm of left kidney, except renal pelvis: Secondary | ICD-10-CM | POA: Diagnosis not present

## 2019-11-09 DIAGNOSIS — Z452 Encounter for adjustment and management of vascular access device: Secondary | ICD-10-CM | POA: Diagnosis not present

## 2019-11-09 DIAGNOSIS — I82621 Acute embolism and thrombosis of deep veins of right upper extremity: Secondary | ICD-10-CM | POA: Diagnosis not present

## 2019-11-13 DIAGNOSIS — M7989 Other specified soft tissue disorders: Secondary | ICD-10-CM | POA: Diagnosis not present

## 2019-11-13 DIAGNOSIS — I10 Essential (primary) hypertension: Secondary | ICD-10-CM | POA: Diagnosis not present

## 2019-11-27 DIAGNOSIS — R05 Cough: Secondary | ICD-10-CM | POA: Diagnosis not present

## 2019-11-27 DIAGNOSIS — R0489 Hemorrhage from other sites in respiratory passages: Secondary | ICD-10-CM | POA: Diagnosis not present

## 2019-11-27 DIAGNOSIS — R918 Other nonspecific abnormal finding of lung field: Secondary | ICD-10-CM | POA: Diagnosis not present

## 2019-11-27 DIAGNOSIS — R911 Solitary pulmonary nodule: Secondary | ICD-10-CM | POA: Diagnosis not present

## 2019-11-27 DIAGNOSIS — I129 Hypertensive chronic kidney disease with stage 1 through stage 4 chronic kidney disease, or unspecified chronic kidney disease: Secondary | ICD-10-CM | POA: Diagnosis not present

## 2019-11-27 DIAGNOSIS — C7800 Secondary malignant neoplasm of unspecified lung: Secondary | ICD-10-CM | POA: Diagnosis not present

## 2019-11-27 DIAGNOSIS — N189 Chronic kidney disease, unspecified: Secondary | ICD-10-CM | POA: Diagnosis not present

## 2019-11-27 DIAGNOSIS — Z85528 Personal history of other malignant neoplasm of kidney: Secondary | ICD-10-CM | POA: Diagnosis not present

## 2019-11-27 DIAGNOSIS — R042 Hemoptysis: Secondary | ICD-10-CM | POA: Diagnosis not present

## 2019-11-27 DIAGNOSIS — Z862 Personal history of diseases of the blood and blood-forming organs and certain disorders involving the immune mechanism: Secondary | ICD-10-CM | POA: Diagnosis not present

## 2019-11-27 DIAGNOSIS — E1122 Type 2 diabetes mellitus with diabetic chronic kidney disease: Secondary | ICD-10-CM | POA: Diagnosis not present

## 2019-11-27 DIAGNOSIS — Z905 Acquired absence of kidney: Secondary | ICD-10-CM | POA: Diagnosis not present

## 2019-11-27 DIAGNOSIS — C7801 Secondary malignant neoplasm of right lung: Secondary | ICD-10-CM | POA: Diagnosis not present

## 2019-11-27 DIAGNOSIS — Z9889 Other specified postprocedural states: Secondary | ICD-10-CM | POA: Diagnosis not present

## 2019-11-28 DIAGNOSIS — M25461 Effusion, right knee: Secondary | ICD-10-CM | POA: Diagnosis not present

## 2019-11-28 DIAGNOSIS — I10 Essential (primary) hypertension: Secondary | ICD-10-CM | POA: Diagnosis not present

## 2019-11-28 DIAGNOSIS — M25462 Effusion, left knee: Secondary | ICD-10-CM | POA: Diagnosis not present

## 2019-11-28 DIAGNOSIS — R911 Solitary pulmonary nodule: Secondary | ICD-10-CM | POA: Diagnosis not present

## 2019-11-28 DIAGNOSIS — G4700 Insomnia, unspecified: Secondary | ICD-10-CM | POA: Diagnosis not present

## 2019-11-28 DIAGNOSIS — C7800 Secondary malignant neoplasm of unspecified lung: Secondary | ICD-10-CM | POA: Diagnosis not present

## 2019-11-28 DIAGNOSIS — C642 Malignant neoplasm of left kidney, except renal pelvis: Secondary | ICD-10-CM | POA: Diagnosis not present

## 2019-11-28 DIAGNOSIS — D649 Anemia, unspecified: Secondary | ICD-10-CM | POA: Diagnosis not present

## 2019-11-28 DIAGNOSIS — R918 Other nonspecific abnormal finding of lung field: Secondary | ICD-10-CM | POA: Diagnosis not present

## 2019-11-28 DIAGNOSIS — R042 Hemoptysis: Secondary | ICD-10-CM | POA: Diagnosis not present

## 2019-11-30 DIAGNOSIS — D649 Anemia, unspecified: Secondary | ICD-10-CM | POA: Diagnosis not present

## 2019-11-30 DIAGNOSIS — R936 Abnormal findings on diagnostic imaging of limbs: Secondary | ICD-10-CM | POA: Diagnosis not present

## 2019-11-30 DIAGNOSIS — I700 Atherosclerosis of aorta: Secondary | ICD-10-CM | POA: Diagnosis not present

## 2019-11-30 DIAGNOSIS — R911 Solitary pulmonary nodule: Secondary | ICD-10-CM | POA: Diagnosis not present

## 2019-11-30 DIAGNOSIS — C649 Malignant neoplasm of unspecified kidney, except renal pelvis: Secondary | ICD-10-CM | POA: Diagnosis not present

## 2019-11-30 DIAGNOSIS — M25511 Pain in right shoulder: Secondary | ICD-10-CM | POA: Diagnosis not present

## 2019-11-30 DIAGNOSIS — Z905 Acquired absence of kidney: Secondary | ICD-10-CM | POA: Diagnosis not present

## 2019-11-30 DIAGNOSIS — R918 Other nonspecific abnormal finding of lung field: Secondary | ICD-10-CM | POA: Diagnosis not present

## 2019-11-30 DIAGNOSIS — C642 Malignant neoplasm of left kidney, except renal pelvis: Secondary | ICD-10-CM | POA: Diagnosis not present

## 2019-11-30 DIAGNOSIS — D7389 Other diseases of spleen: Secondary | ICD-10-CM | POA: Diagnosis not present

## 2019-11-30 DIAGNOSIS — C7801 Secondary malignant neoplasm of right lung: Secondary | ICD-10-CM | POA: Diagnosis not present

## 2019-12-04 DIAGNOSIS — G4700 Insomnia, unspecified: Secondary | ICD-10-CM | POA: Diagnosis not present

## 2019-12-04 DIAGNOSIS — M25462 Effusion, left knee: Secondary | ICD-10-CM | POA: Diagnosis not present

## 2019-12-04 DIAGNOSIS — C7800 Secondary malignant neoplasm of unspecified lung: Secondary | ICD-10-CM | POA: Diagnosis not present

## 2019-12-04 DIAGNOSIS — M25562 Pain in left knee: Secondary | ICD-10-CM | POA: Diagnosis not present

## 2019-12-04 DIAGNOSIS — C642 Malignant neoplasm of left kidney, except renal pelvis: Secondary | ICD-10-CM | POA: Diagnosis not present

## 2019-12-04 DIAGNOSIS — C7951 Secondary malignant neoplasm of bone: Secondary | ICD-10-CM | POA: Diagnosis not present

## 2019-12-04 DIAGNOSIS — M25561 Pain in right knee: Secondary | ICD-10-CM | POA: Diagnosis not present

## 2019-12-04 DIAGNOSIS — I10 Essential (primary) hypertension: Secondary | ICD-10-CM | POA: Diagnosis not present

## 2019-12-04 DIAGNOSIS — E559 Vitamin D deficiency, unspecified: Secondary | ICD-10-CM | POA: Diagnosis not present

## 2019-12-04 DIAGNOSIS — D649 Anemia, unspecified: Secondary | ICD-10-CM | POA: Diagnosis not present

## 2019-12-04 DIAGNOSIS — M25461 Effusion, right knee: Secondary | ICD-10-CM | POA: Diagnosis not present

## 2019-12-07 DIAGNOSIS — C642 Malignant neoplasm of left kidney, except renal pelvis: Secondary | ICD-10-CM | POA: Diagnosis not present

## 2019-12-07 DIAGNOSIS — I10 Essential (primary) hypertension: Secondary | ICD-10-CM | POA: Diagnosis not present

## 2019-12-07 DIAGNOSIS — C7800 Secondary malignant neoplasm of unspecified lung: Secondary | ICD-10-CM | POA: Diagnosis not present

## 2019-12-07 DIAGNOSIS — M25511 Pain in right shoulder: Secondary | ICD-10-CM | POA: Diagnosis not present

## 2019-12-07 DIAGNOSIS — D7389 Other diseases of spleen: Secondary | ICD-10-CM | POA: Diagnosis not present

## 2019-12-07 DIAGNOSIS — C7951 Secondary malignant neoplasm of bone: Secondary | ICD-10-CM | POA: Diagnosis not present

## 2019-12-07 DIAGNOSIS — Z79899 Other long term (current) drug therapy: Secondary | ICD-10-CM | POA: Diagnosis not present

## 2019-12-10 DIAGNOSIS — C7951 Secondary malignant neoplasm of bone: Secondary | ICD-10-CM | POA: Diagnosis not present

## 2019-12-10 DIAGNOSIS — C642 Malignant neoplasm of left kidney, except renal pelvis: Secondary | ICD-10-CM | POA: Diagnosis not present

## 2019-12-12 DIAGNOSIS — C642 Malignant neoplasm of left kidney, except renal pelvis: Secondary | ICD-10-CM | POA: Diagnosis not present

## 2019-12-12 DIAGNOSIS — Z510 Encounter for antineoplastic radiation therapy: Secondary | ICD-10-CM | POA: Diagnosis not present

## 2019-12-12 DIAGNOSIS — C7951 Secondary malignant neoplasm of bone: Secondary | ICD-10-CM | POA: Diagnosis not present

## 2019-12-13 DIAGNOSIS — Z510 Encounter for antineoplastic radiation therapy: Secondary | ICD-10-CM | POA: Diagnosis not present

## 2019-12-13 DIAGNOSIS — C642 Malignant neoplasm of left kidney, except renal pelvis: Secondary | ICD-10-CM | POA: Diagnosis not present

## 2019-12-14 DIAGNOSIS — Z510 Encounter for antineoplastic radiation therapy: Secondary | ICD-10-CM | POA: Diagnosis not present

## 2019-12-14 DIAGNOSIS — C642 Malignant neoplasm of left kidney, except renal pelvis: Secondary | ICD-10-CM | POA: Diagnosis not present

## 2019-12-17 DIAGNOSIS — C642 Malignant neoplasm of left kidney, except renal pelvis: Secondary | ICD-10-CM | POA: Diagnosis not present

## 2019-12-17 DIAGNOSIS — Z510 Encounter for antineoplastic radiation therapy: Secondary | ICD-10-CM | POA: Diagnosis not present

## 2019-12-18 DIAGNOSIS — Z510 Encounter for antineoplastic radiation therapy: Secondary | ICD-10-CM | POA: Diagnosis not present

## 2019-12-18 DIAGNOSIS — C7951 Secondary malignant neoplasm of bone: Secondary | ICD-10-CM | POA: Diagnosis not present

## 2019-12-18 DIAGNOSIS — C642 Malignant neoplasm of left kidney, except renal pelvis: Secondary | ICD-10-CM | POA: Diagnosis not present

## 2019-12-19 DIAGNOSIS — C642 Malignant neoplasm of left kidney, except renal pelvis: Secondary | ICD-10-CM | POA: Diagnosis not present

## 2020-01-01 DIAGNOSIS — D649 Anemia, unspecified: Secondary | ICD-10-CM | POA: Diagnosis not present

## 2020-01-01 DIAGNOSIS — G4700 Insomnia, unspecified: Secondary | ICD-10-CM | POA: Diagnosis not present

## 2020-01-01 DIAGNOSIS — C642 Malignant neoplasm of left kidney, except renal pelvis: Secondary | ICD-10-CM | POA: Diagnosis not present

## 2020-01-01 DIAGNOSIS — C7800 Secondary malignant neoplasm of unspecified lung: Secondary | ICD-10-CM | POA: Diagnosis not present

## 2020-01-03 DIAGNOSIS — E559 Vitamin D deficiency, unspecified: Secondary | ICD-10-CM | POA: Diagnosis not present

## 2020-01-03 DIAGNOSIS — C642 Malignant neoplasm of left kidney, except renal pelvis: Secondary | ICD-10-CM | POA: Diagnosis not present

## 2020-01-03 DIAGNOSIS — I10 Essential (primary) hypertension: Secondary | ICD-10-CM | POA: Diagnosis not present

## 2020-01-03 DIAGNOSIS — D649 Anemia, unspecified: Secondary | ICD-10-CM | POA: Diagnosis not present

## 2020-01-10 DIAGNOSIS — I10 Essential (primary) hypertension: Secondary | ICD-10-CM | POA: Diagnosis not present

## 2020-01-10 DIAGNOSIS — C642 Malignant neoplasm of left kidney, except renal pelvis: Secondary | ICD-10-CM

## 2020-01-10 DIAGNOSIS — C7800 Secondary malignant neoplasm of unspecified lung: Secondary | ICD-10-CM | POA: Diagnosis not present

## 2020-01-10 DIAGNOSIS — E559 Vitamin D deficiency, unspecified: Secondary | ICD-10-CM | POA: Diagnosis not present

## 2020-01-10 DIAGNOSIS — C7951 Secondary malignant neoplasm of bone: Secondary | ICD-10-CM | POA: Diagnosis not present

## 2020-01-10 DIAGNOSIS — Z515 Encounter for palliative care: Secondary | ICD-10-CM | POA: Diagnosis not present

## 2020-01-10 DIAGNOSIS — D649 Anemia, unspecified: Secondary | ICD-10-CM | POA: Diagnosis not present

## 2020-01-10 DIAGNOSIS — G4700 Insomnia, unspecified: Secondary | ICD-10-CM | POA: Diagnosis not present

## 2020-01-14 DIAGNOSIS — C7800 Secondary malignant neoplasm of unspecified lung: Secondary | ICD-10-CM | POA: Diagnosis not present

## 2020-01-14 DIAGNOSIS — G4700 Insomnia, unspecified: Secondary | ICD-10-CM | POA: Diagnosis not present

## 2020-01-14 DIAGNOSIS — D649 Anemia, unspecified: Secondary | ICD-10-CM | POA: Diagnosis not present

## 2020-01-14 DIAGNOSIS — E559 Vitamin D deficiency, unspecified: Secondary | ICD-10-CM | POA: Diagnosis not present

## 2020-01-14 DIAGNOSIS — C7951 Secondary malignant neoplasm of bone: Secondary | ICD-10-CM | POA: Diagnosis not present

## 2020-01-14 DIAGNOSIS — C642 Malignant neoplasm of left kidney, except renal pelvis: Secondary | ICD-10-CM | POA: Diagnosis not present

## 2020-01-29 DIAGNOSIS — C642 Malignant neoplasm of left kidney, except renal pelvis: Secondary | ICD-10-CM | POA: Diagnosis not present

## 2020-01-31 DIAGNOSIS — C642 Malignant neoplasm of left kidney, except renal pelvis: Secondary | ICD-10-CM | POA: Diagnosis not present

## 2020-02-06 DIAGNOSIS — I10 Essential (primary) hypertension: Secondary | ICD-10-CM | POA: Diagnosis not present

## 2020-02-06 DIAGNOSIS — C642 Malignant neoplasm of left kidney, except renal pelvis: Secondary | ICD-10-CM | POA: Diagnosis not present

## 2020-02-06 DIAGNOSIS — C7800 Secondary malignant neoplasm of unspecified lung: Secondary | ICD-10-CM | POA: Diagnosis not present

## 2020-02-06 DIAGNOSIS — G4700 Insomnia, unspecified: Secondary | ICD-10-CM | POA: Diagnosis not present

## 2020-02-06 DIAGNOSIS — D519 Vitamin B12 deficiency anemia, unspecified: Secondary | ICD-10-CM | POA: Diagnosis not present

## 2020-02-06 DIAGNOSIS — D559 Anemia due to enzyme disorder, unspecified: Secondary | ICD-10-CM | POA: Diagnosis not present

## 2020-02-06 DIAGNOSIS — D649 Anemia, unspecified: Secondary | ICD-10-CM | POA: Diagnosis not present

## 2020-02-06 DIAGNOSIS — C7951 Secondary malignant neoplasm of bone: Secondary | ICD-10-CM | POA: Diagnosis not present

## 2020-02-06 DIAGNOSIS — D509 Iron deficiency anemia, unspecified: Secondary | ICD-10-CM | POA: Diagnosis not present

## 2020-02-08 DIAGNOSIS — C7951 Secondary malignant neoplasm of bone: Secondary | ICD-10-CM | POA: Diagnosis not present

## 2020-02-08 DIAGNOSIS — M542 Cervicalgia: Secondary | ICD-10-CM | POA: Diagnosis not present

## 2020-02-08 DIAGNOSIS — M1612 Unilateral primary osteoarthritis, left hip: Secondary | ICD-10-CM | POA: Diagnosis not present

## 2020-02-08 DIAGNOSIS — C642 Malignant neoplasm of left kidney, except renal pelvis: Secondary | ICD-10-CM | POA: Diagnosis not present

## 2020-02-08 DIAGNOSIS — C7802 Secondary malignant neoplasm of left lung: Secondary | ICD-10-CM | POA: Diagnosis not present

## 2020-02-08 DIAGNOSIS — R911 Solitary pulmonary nodule: Secondary | ICD-10-CM | POA: Diagnosis not present

## 2020-02-08 DIAGNOSIS — C7801 Secondary malignant neoplasm of right lung: Secondary | ICD-10-CM | POA: Diagnosis not present

## 2020-02-08 DIAGNOSIS — M545 Low back pain: Secondary | ICD-10-CM | POA: Diagnosis not present

## 2020-02-08 DIAGNOSIS — Z905 Acquired absence of kidney: Secondary | ICD-10-CM | POA: Diagnosis not present

## 2020-02-08 DIAGNOSIS — R918 Other nonspecific abnormal finding of lung field: Secondary | ICD-10-CM | POA: Diagnosis not present

## 2020-02-17 ENCOUNTER — Encounter: Payer: Self-pay | Admitting: Oncology

## 2020-02-17 DIAGNOSIS — C642 Malignant neoplasm of left kidney, except renal pelvis: Secondary | ICD-10-CM

## 2020-02-22 ENCOUNTER — Encounter: Payer: Self-pay | Admitting: Pharmacist

## 2020-02-22 DIAGNOSIS — C7951 Secondary malignant neoplasm of bone: Secondary | ICD-10-CM | POA: Insufficient documentation

## 2020-03-05 ENCOUNTER — Other Ambulatory Visit: Payer: Self-pay | Admitting: Hematology and Oncology

## 2020-03-05 DIAGNOSIS — D649 Anemia, unspecified: Secondary | ICD-10-CM | POA: Diagnosis not present

## 2020-03-05 DIAGNOSIS — C642 Malignant neoplasm of left kidney, except renal pelvis: Secondary | ICD-10-CM | POA: Diagnosis not present

## 2020-03-05 LAB — CBC AND DIFFERENTIAL
HCT: 38 — AB (ref 41–53)
Hemoglobin: 12.2 — AB (ref 13.5–17.5)
Neutrophils Absolute: 4
Platelets: 299 (ref 150–399)
WBC: 7.1

## 2020-03-05 LAB — HEPATIC FUNCTION PANEL
ALT: 31 (ref 10–40)
AST: 23 (ref 14–40)
Alkaline Phosphatase: 89 (ref 25–125)
Bilirubin, Total: 1

## 2020-03-05 LAB — BASIC METABOLIC PANEL
BUN: 19 (ref 4–21)
CO2: 22 (ref 13–22)
Chloride: 97 — AB (ref 99–108)
Creatinine: 1.4 — AB (ref 0.6–1.3)
Glucose: 122
Potassium: 5 (ref 3.4–5.3)
Sodium: 129 — AB (ref 137–147)

## 2020-03-05 LAB — COMPREHENSIVE METABOLIC PANEL
Albumin: 4.3 (ref 3.5–5.0)
Calcium: 9.4 (ref 8.7–10.7)

## 2020-03-05 LAB — CBC: RBC: 4.53 (ref 3.87–5.11)

## 2020-03-10 ENCOUNTER — Other Ambulatory Visit: Payer: Self-pay | Admitting: Hematology and Oncology

## 2020-03-10 DIAGNOSIS — D509 Iron deficiency anemia, unspecified: Secondary | ICD-10-CM | POA: Diagnosis not present

## 2020-03-10 DIAGNOSIS — C642 Malignant neoplasm of left kidney, except renal pelvis: Secondary | ICD-10-CM | POA: Diagnosis not present

## 2020-03-10 DIAGNOSIS — D519 Vitamin B12 deficiency anemia, unspecified: Secondary | ICD-10-CM | POA: Diagnosis not present

## 2020-03-10 LAB — BASIC METABOLIC PANEL
BUN: 21 (ref 4–21)
CO2: 21 (ref 13–22)
Chloride: 105 (ref 99–108)
Creatinine: 1.2 (ref 0.6–1.3)
Glucose: 102
Potassium: 4.5 (ref 3.4–5.3)
Sodium: 137 (ref 137–147)

## 2020-03-10 LAB — CBC AND DIFFERENTIAL
HCT: 33 — AB (ref 41–53)
Hemoglobin: 10.8 — AB (ref 13.5–17.5)
Neutrophils Absolute: 3970
Platelets: 305 (ref 150–399)
WBC: 6.5

## 2020-03-10 LAB — CBC: RBC: 4 (ref 3.87–5.11)

## 2020-03-10 LAB — HEPATIC FUNCTION PANEL
ALT: 23 (ref 10–40)
AST: 20 (ref 14–40)
Alkaline Phosphatase: 80 (ref 25–125)
Bilirubin, Total: 0.5

## 2020-03-10 LAB — COMPREHENSIVE METABOLIC PANEL
Albumin: 3.9 (ref 3.5–5.0)
Calcium: 9.3 (ref 8.7–10.7)

## 2020-03-11 ENCOUNTER — Telehealth: Payer: Self-pay | Admitting: Oncology

## 2020-03-11 ENCOUNTER — Other Ambulatory Visit: Payer: Self-pay

## 2020-03-11 ENCOUNTER — Inpatient Hospital Stay: Payer: BC Managed Care – PPO | Attending: Oncology

## 2020-03-11 ENCOUNTER — Other Ambulatory Visit: Payer: Self-pay | Admitting: Hematology and Oncology

## 2020-03-11 VITALS — BP 134/72 | HR 73 | Temp 98.9°F | Resp 18 | Wt 244.5 lb

## 2020-03-11 DIAGNOSIS — C7951 Secondary malignant neoplasm of bone: Secondary | ICD-10-CM | POA: Diagnosis not present

## 2020-03-11 DIAGNOSIS — C642 Malignant neoplasm of left kidney, except renal pelvis: Secondary | ICD-10-CM | POA: Insufficient documentation

## 2020-03-11 DIAGNOSIS — D649 Anemia, unspecified: Secondary | ICD-10-CM | POA: Diagnosis not present

## 2020-03-11 MED ORDER — DENOSUMAB 120 MG/1.7ML ~~LOC~~ SOLN
SUBCUTANEOUS | Status: AC
  Filled 2020-03-11: qty 1.7

## 2020-03-11 MED ORDER — DENOSUMAB 120 MG/1.7ML ~~LOC~~ SOLN
120.0000 mg | Freq: Once | SUBCUTANEOUS | Status: AC
  Administered 2020-03-11: 120 mg via SUBCUTANEOUS

## 2020-03-11 NOTE — Progress Notes (Signed)
Patient stable at discharge.

## 2020-03-11 NOTE — Patient Instructions (Signed)
Denosumab injection What is this medicine? DENOSUMAB (den oh sue mab) slows bone breakdown. Prolia is used to treat osteoporosis in women after menopause and in men, and in people who are taking corticosteroids for 6 months or more. Xgeva is used to treat a high calcium level due to cancer and to prevent bone fractures and other bone problems caused by multiple myeloma or cancer bone metastases. Xgeva is also used to treat giant cell tumor of the bone. This medicine may be used for other purposes; ask your health care provider or pharmacist if you have questions. COMMON BRAND NAME(S): Prolia, XGEVA What should I tell my health care provider before I take this medicine? They need to know if you have any of these conditions:  dental disease  having surgery or tooth extraction  infection  kidney disease  low levels of calcium or Vitamin D in the blood  malnutrition  on hemodialysis  skin conditions or sensitivity  thyroid or parathyroid disease  an unusual reaction to denosumab, other medicines, foods, dyes, or preservatives  pregnant or trying to get pregnant  breast-feeding How should I use this medicine? This medicine is for injection under the skin. It is given by a health care professional in a hospital or clinic setting. A special MedGuide will be given to you before each treatment. Be sure to read this information carefully each time. For Prolia, talk to your pediatrician regarding the use of this medicine in children. Special care may be needed. For Xgeva, talk to your pediatrician regarding the use of this medicine in children. While this drug may be prescribed for children as young as 13 years for selected conditions, precautions do apply. Overdosage: If you think you have taken too much of this medicine contact a poison control center or emergency room at once. NOTE: This medicine is only for you. Do not share this medicine with others. What if I miss a dose? It is  important not to miss your dose. Call your doctor or health care professional if you are unable to keep an appointment. What may interact with this medicine? Do not take this medicine with any of the following medications:  other medicines containing denosumab This medicine may also interact with the following medications:  medicines that lower your chance of fighting infection  steroid medicines like prednisone or cortisone This list may not describe all possible interactions. Give your health care provider a list of all the medicines, herbs, non-prescription drugs, or dietary supplements you use. Also tell them if you smoke, drink alcohol, or use illegal drugs. Some items may interact with your medicine. What should I watch for while using this medicine? Visit your doctor or health care professional for regular checks on your progress. Your doctor or health care professional may order blood tests and other tests to see how you are doing. Call your doctor or health care professional for advice if you get a fever, chills or sore throat, or other symptoms of a cold or flu. Do not treat yourself. This drug may decrease your body's ability to fight infection. Try to avoid being around people who are sick. You should make sure you get enough calcium and vitamin D while you are taking this medicine, unless your doctor tells you not to. Discuss the foods you eat and the vitamins you take with your health care professional. See your dentist regularly. Brush and floss your teeth as directed. Before you have any dental work done, tell your dentist you are   receiving this medicine. Do not become pregnant while taking this medicine or for 5 months after stopping it. Talk with your doctor or health care professional about your birth control options while taking this medicine. Women should inform their doctor if they wish to become pregnant or think they might be pregnant. There is a potential for serious side  effects to an unborn child. Talk to your health care professional or pharmacist for more information. What side effects may I notice from receiving this medicine? Side effects that you should report to your doctor or health care professional as soon as possible:  allergic reactions like skin rash, itching or hives, swelling of the face, lips, or tongue  bone pain  breathing problems  dizziness  jaw pain, especially after dental work  redness, blistering, peeling of the skin  signs and symptoms of infection like fever or chills; cough; sore throat; pain or trouble passing urine  signs of low calcium like fast heartbeat, muscle cramps or muscle pain; pain, tingling, numbness in the hands or feet; seizures  unusual bleeding or bruising  unusually weak or tired Side effects that usually do not require medical attention (report to your doctor or health care professional if they continue or are bothersome):  constipation  diarrhea  headache  joint pain  loss of appetite  muscle pain  runny nose  tiredness  upset stomach This list may not describe all possible side effects. Call your doctor for medical advice about side effects. You may report side effects to FDA at 1-800-FDA-1088. Where should I keep my medicine? This medicine is only given in a clinic, doctor's office, or other health care setting and will not be stored at home. NOTE: This sheet is a summary. It may not cover all possible information. If you have questions about this medicine, talk to your doctor, pharmacist, or health care provider.  2020 [employer] Standard (2017-09-23 16:10:44)

## 2020-03-19 ENCOUNTER — Other Ambulatory Visit: Payer: Self-pay | Admitting: Hematology and Oncology

## 2020-03-19 DIAGNOSIS — C642 Malignant neoplasm of left kidney, except renal pelvis: Secondary | ICD-10-CM | POA: Diagnosis not present

## 2020-03-19 LAB — CBC AND DIFFERENTIAL
HCT: 32 — AB (ref 41–53)
Hemoglobin: 10.6 — AB (ref 13.5–17.5)
Neutrophils Absolute: 4.16
Platelets: 377 (ref 150–399)
WBC: 6.6

## 2020-03-19 LAB — HEPATIC FUNCTION PANEL
ALT: 19 (ref 10–40)
AST: 22 (ref 14–40)
Alkaline Phosphatase: 97 (ref 25–125)
Bilirubin, Total: 0.5

## 2020-03-19 LAB — BASIC METABOLIC PANEL
BUN: 31 — AB (ref 4–21)
CO2: 21 (ref 13–22)
Chloride: 103 (ref 99–108)
Creatinine: 1.5 — AB (ref 0.6–1.3)
Glucose: 127
Potassium: 4.3 (ref 3.4–5.3)
Sodium: 136 — AB (ref 137–147)

## 2020-03-19 LAB — CBC: RBC: 3.9 (ref 3.87–5.11)

## 2020-03-19 LAB — COMPREHENSIVE METABOLIC PANEL
Albumin: 4.1 (ref 3.5–5.0)
Calcium: 9.4 (ref 8.7–10.7)

## 2020-03-27 DIAGNOSIS — C642 Malignant neoplasm of left kidney, except renal pelvis: Secondary | ICD-10-CM | POA: Diagnosis not present

## 2020-03-27 DIAGNOSIS — D519 Vitamin B12 deficiency anemia, unspecified: Secondary | ICD-10-CM | POA: Diagnosis not present

## 2020-03-27 DIAGNOSIS — D509 Iron deficiency anemia, unspecified: Secondary | ICD-10-CM | POA: Diagnosis not present

## 2020-03-30 DIAGNOSIS — R42 Dizziness and giddiness: Secondary | ICD-10-CM | POA: Diagnosis not present

## 2020-04-01 NOTE — Progress Notes (Unsigned)
PT STABLE AT TIME OF DISCHARGE 

## 2020-04-08 ENCOUNTER — Inpatient Hospital Stay: Payer: BC Managed Care – PPO

## 2020-04-08 DIAGNOSIS — C642 Malignant neoplasm of left kidney, except renal pelvis: Secondary | ICD-10-CM | POA: Diagnosis not present

## 2020-04-08 DIAGNOSIS — C7801 Secondary malignant neoplasm of right lung: Secondary | ICD-10-CM | POA: Diagnosis not present

## 2020-04-08 DIAGNOSIS — I700 Atherosclerosis of aorta: Secondary | ICD-10-CM | POA: Diagnosis not present

## 2020-04-08 DIAGNOSIS — M47816 Spondylosis without myelopathy or radiculopathy, lumbar region: Secondary | ICD-10-CM | POA: Diagnosis not present

## 2020-04-08 DIAGNOSIS — M1612 Unilateral primary osteoarthritis, left hip: Secondary | ICD-10-CM | POA: Diagnosis not present

## 2020-04-08 DIAGNOSIS — C641 Malignant neoplasm of right kidney, except renal pelvis: Secondary | ICD-10-CM | POA: Diagnosis not present

## 2020-04-08 DIAGNOSIS — C7951 Secondary malignant neoplasm of bone: Secondary | ICD-10-CM | POA: Diagnosis not present

## 2020-04-08 DIAGNOSIS — C7802 Secondary malignant neoplasm of left lung: Secondary | ICD-10-CM | POA: Diagnosis not present

## 2020-04-08 DIAGNOSIS — Z905 Acquired absence of kidney: Secondary | ICD-10-CM | POA: Diagnosis not present

## 2020-04-09 ENCOUNTER — Other Ambulatory Visit: Payer: Self-pay | Admitting: Oncology

## 2020-04-09 DIAGNOSIS — C642 Malignant neoplasm of left kidney, except renal pelvis: Secondary | ICD-10-CM

## 2020-04-10 ENCOUNTER — Inpatient Hospital Stay: Payer: BC Managed Care – PPO

## 2020-04-10 ENCOUNTER — Other Ambulatory Visit: Payer: Self-pay | Admitting: Oncology

## 2020-04-10 ENCOUNTER — Inpatient Hospital Stay: Payer: BC Managed Care – PPO | Attending: Oncology | Admitting: Oncology

## 2020-04-10 ENCOUNTER — Other Ambulatory Visit: Payer: Self-pay

## 2020-04-10 ENCOUNTER — Encounter: Payer: Self-pay | Admitting: Oncology

## 2020-04-10 VITALS — BP 133/88 | HR 104 | Temp 98.0°F | Resp 18 | Ht 70.0 in | Wt 235.0 lb

## 2020-04-10 DIAGNOSIS — Z79899 Other long term (current) drug therapy: Secondary | ICD-10-CM

## 2020-04-10 DIAGNOSIS — D649 Anemia, unspecified: Secondary | ICD-10-CM | POA: Insufficient documentation

## 2020-04-10 DIAGNOSIS — E559 Vitamin D deficiency, unspecified: Secondary | ICD-10-CM | POA: Insufficient documentation

## 2020-04-10 DIAGNOSIS — I10 Essential (primary) hypertension: Secondary | ICD-10-CM | POA: Insufficient documentation

## 2020-04-10 DIAGNOSIS — C7951 Secondary malignant neoplasm of bone: Secondary | ICD-10-CM | POA: Diagnosis not present

## 2020-04-10 DIAGNOSIS — C642 Malignant neoplasm of left kidney, except renal pelvis: Secondary | ICD-10-CM | POA: Diagnosis not present

## 2020-04-10 DIAGNOSIS — M109 Gout, unspecified: Secondary | ICD-10-CM | POA: Insufficient documentation

## 2020-04-10 DIAGNOSIS — Z905 Acquired absence of kidney: Secondary | ICD-10-CM | POA: Diagnosis not present

## 2020-04-10 DIAGNOSIS — C7800 Secondary malignant neoplasm of unspecified lung: Secondary | ICD-10-CM

## 2020-04-10 NOTE — Progress Notes (Signed)
[facility]  [address] [place],  [place]  [address] [phone]  Clinic Day:  04/10/2020  Referring physician: Derwood Kaplan, MD   This document serves as a record of services personally performed by [name], MD. It was created on their behalf by [name],[name], a trained medical scribe. The creation of this record is based on the scribe's personal observations and the provider's statements to them.   CHIEF COMPLAINT:  CC: Stage III renal cell carcinoma  Current Treatment:  Denosumab   HISTORY OF PRESENT ILLNESS:  Steven Peck is a 54 y.o. 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  We began seeing him in May 2020 when he was referred by [name], NP, for a left renal mass and iron deficiency anemia.  He had transient hematuria, which later resolved, and weight loss.  In early May 2020, his hemoglobin was 11.3 with an MCV of 75. PSA was normal.  He had hypercalcemia with a calcium of 12.8.  Repeat labs a few days later revealed persistent hypercalcemia with a calcium of 13.  PTH was low at 6. Vitamin-D was low at 17.1.  He was started on vitamin D supplementation.  His hemoglobin continued to decrease to 10.8.  Serum protein electrophoresis was negative for a monoclonal spike but his gamma globulins were elevated at 4.4.  A random urine protein electrophoresis was also negative for monoclonal spike.  He was given hydrocodone/APAP 7.5/325 every 6 hours as needed for pain.  CT abdomen and pelvis revealed marked generalized swelling of the left kidney with hyperdensity throughout the renal collecting system, renal pelvis, ureter and posterior bladder consistent with gross hemorrhage with a 6-7 cm infiltrating mass in the upper pole of the left kidney.  There was no evidence of venous invasion.  There were abnormal nodes in the retroperitoneum medial to the left kidney, up to 1.5 cm in diameter.  Repeat  calcium in our office at the end of May was down to 11.6.  When we saw him in early June, his hemoglobin has dropped from 10 to 9.5.  B12 was low normal and he was placed on oral vitamin B12 1000 mcg daily.  His calcium had increased to 13, so he was given IV fluids and IV zoledronic acid 4 mg.  He reported persistent pain in his left flank and left upper inner thigh, which he continue to rate 10/10, despite the hydrocodone/APAP.  We switched him to oxycodone 10 mg every 4 hours as needed.  PET scan revealed malignancy of the kidney, but no definite evidence of metastasis.  There were two right pulmonary nodules measuring 3 mm.  MRI brain did not reveal any intracranial metastasis.  He underwent biopsy of the left kidney mass in June which revealed renal cell carcinoma.  He underwent left radical nephrectomy in August.  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  11 lymph nodes were negative for metastasis.  Tumor extended into the renal vein, but margins were clear.  Due to his high risk of recurrence, he was was recommended for adjuvant oral chemotherapy with sunitinib 50 mg daily for 2 weeks on and 1 week off for total of 1 year.  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  He was evaluated in the emergency department and placed on clonidine 0.2 mg 3 times daily.  The hypertension was uncontrolled  with this, and other medications were tried.  Sunitinib was therefore discontinued after only 2 weeks of therapy.  He saw Dr. [name] for routine follow-up in February of 2021.  At that time, CT abdomen and pelvis revealed basilar pulmonary nodules, which had increased in size and number, and were felt to be highly suspicious for metastatic disease.  There was a persistent splenic lesion, once again felt to be benign.  He was referred back to [place].  He had worsening anemia and iron panel and ferritin were equivocal, but a soluble  transferrin was normal, so inconsistent with iron deficiency.  PET scan revealed several of the pulmonary nodules to be hypermetabolic, consistent with metastatic disease.  There was no evidence of recurrent or metastatic disease within the abdomen and pelvis.  We recommended palliative immunotherapy with ipilimumab/nivolumab for 4 cycles to be followed by maintenance nivolumab.  He experienced severe pain of his right shoulder and down his right arm with associated swelling.  He is unable to fully lift his arm, and his right neck is also swollen.  Ultrasound was negative for thrombosis.  His Port-A-Cath was then removed.  He was seen in clinic at the end of the day on July 1st., after he had been seen in the emergency department earlier in the day because of mild hemoptysis.  CTA chest with contrast revealed pulmonary metastases with mild progression from March 2021.  The apical segment right lower lobe nodule was 17 mm, previously 10 mm.  Mild ground-glass opacity around a right lower lobe nodule measuring 22 mm, previously 14 mm.  It was unclear if this represents pseudoprogression or true progression.  He was still complaining of severe right shoulder pain, so x-rays were ordered, and revealed a large lucent lesion in the right humeral head with cortical destruction, as well as moderate degenerative changes of the acromioclavicular and glenohumeral joints.  CT chest, abdomen and pelvis from July 2nd revealed a destructive metastatic lesion of the right proximal humerus measuring 19 mm.  Numerous pulmonary nodules were again observed, similar to the prior exam on June 30th, but new and enlarging nodules overall since March 2021.  Splenic lesion measuring 5.6 x 3.0 cm, previously 4.7 x 5.0 cm, is indeterminate, but this is suspicious for metastasis despite lack of FDG uptake, as it has enlarged since previous imaging.  Nivolumab was discontinued.  He saw Dr. [name] and received palliative radiation to the right  humeral head lesion, completed on July 20th.    Due to his progressive disease he was switched to palliative oral chemotherapy with cabozantinib 60 mg daily which he started on July 21st.  This was placed on hold due to uncontrolled hypertension.  He also had hypopigmentation of the perioral area, as well as scattered areas on his back.  He discontinued tramadol as this was causing severe headaches.  He uses hydrocodone/APAP as needed for pain.  He resumed cabozantinib on August 12th, once his blood pressure was controlled.  He had mild hyperkalemia at that time, so we increased his HCTZ to 2 tabs daily.  He has had persistent right shoulder pain, for which he uses Tylenol, as he did not tolerate any opioids.  We added mirtazapine 15 mg at bedtime for insomnia in September.  He discontinued cabozantinib in September weeks ago due to progressive vitiligo.  We recommended alternative therapy with axitinib 5 mg twice daily.  He continues denosumab every 4 weeks.  Repeat CT imaging was done prior to starting axitinib in revealed  a decrease in his pulmonary nodules, without evidence of new metastatic disease in the lungs or abdomen and pelvis.  He presents to clinic today for follow-up.  He was evaluated in the clinic two weeks ago for increasing edema to bilateral hands, ankles and feet.  He was started on Lasix.  He was also found to have an elevated uric acid consistent with gout which he claims he has dealt with in the past.  Today he states the swelling has decreased minimally and the pain continues in both ankles, knees and bottoms of his feet.  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 He also continues to have pain in his right shoulder and reports decreased range of motion due to pain. We restarted his axitinib two weeks ago; however, the patient called last week to say that his symptoms of  shortness of breath, fatigue and hoarseness  recurred so he stopped his axitinib on his own.He also reports one episode of coughing up bloody sputum. His denies shortness of breath, cough, chest pain or hoarseness today. He denies fever, chills, nausea or vomiting. He denies issue with bowel or bladder. CBC today is stable from last visit. CMP reveals BUN 29 and creatinine 1.4, slightly decreased since last visit. He states his PCP has stopped all of his blood pressure medicines except one, but he doesn't remember which one he was to continue and admits to only taking it when he feels poorly. His appetite and energy have improved since stopping his medications.    INTERVAL HISTORY:  Steven Peck is here for routine follow up and to discuss recent imaging results.  CT imaging from November 9th revealed mild interval progression of bilateral pulmonary metastases, and slight interval increase in size of bilateral external iliac lymph nodes although they remain within normal limits for size.  No evidence for new or suspicious soft tissue in the nephrectomy bed.  He has been off of treatment with axitinib for 3 weeks to 1 month now, and is doing well.  This was causing him shortness of breath, fatigue and hoarseness.  He continues to have mild right shoulder pain, and uses Tylenol to treat.  His hemoglobin is stable at 10.0, and his white count and platelets are normal.  Chemistries are unremarkable except for a BUN of 21, improved and a creatinine of 1.3, improved.  His  appetite is good, and he has gained 1 pounds since his last visit.  He denies fever, chills or other signs of infection.  He denies nausea, vomiting, bowel issues, or abdominal pain.  He denies sore throat, cough, dyspnea, or chest pain.   REVIEW OF SYSTEMS:  Review of Systems  Musculoskeletal: Positive for arthralgias (right shoulder) and myalgias (right shoulder).  All other systems reviewed and are negative.    VITALS:  There were no vitals  taken for this visit.  Wt Readings from Last 3 Encounters:  04/01/20 234 lb 7 oz (106.3 kg)  03/11/20 244 lb 8 oz (110.9 kg)  01/10/19 (!) 491 lb 6.5 oz (222.9 kg)    There is no height or weight on file to calculate BMI.  Performance status (ECOG): 1 - Symptomatic but completely ambulatory  PHYSICAL EXAM:  Physical Exam Constitutional:      General: He is not in acute distress.    Appearance: Normal appearance. He is normal weight.  HENT:  Head: Normocephalic and atraumatic.  Eyes:     General: No scleral icterus.    Extraocular Movements: Extraocular movements intact.     Conjunctiva/sclera: Conjunctivae normal.     Pupils: Pupils are equal, round, and reactive to light.  Cardiovascular:     Rate and Rhythm: Normal rate and regular rhythm.     Pulses: Normal pulses.     Heart sounds: No murmur heard.  No friction rub. No gallop.   Pulmonary:     Effort: Pulmonary effort is normal. No respiratory distress.     Breath sounds: Normal breath sounds.  Abdominal:     General: Bowel sounds are normal.     Palpations: Abdomen is soft. There is no mass.     Tenderness: There is no abdominal tenderness.  Musculoskeletal:        General: Normal range of motion.     Cervical back: Neck supple.     Right lower leg: No edema.     Left lower leg: No edema.  Lymphadenopathy:     Cervical: No cervical adenopathy.  Skin:    General: Skin is warm and dry.  Neurological:     General: No focal deficit present.     Mental Status: He is alert and oriented to person, place, and time. Mental status is at baseline.  Psychiatric:        Mood and Affect: Mood normal.        Behavior: Behavior normal.        Thought Content: Thought content normal.        Judgment: Judgment normal.     LABS:   His hemoglobin is stable at 10.0, and his white count and platelets are normal.  Chemistries are unremarkable except for a BUN of 21, improved and a creatinine of 1.3, improved  CBC Latest Ref  Rng & Units 03/19/2020 03/10/2020 03/05/2020  WBC - 6.6 6.5 7.1  Hemoglobin 13.5 - 17.5 10.6(A) 10.8(A) 12.2(A)  Hematocrit 41 - 53 32(A) 33(A) 38(A)  Platelets 150 - 399 377 305 299   CMP Latest Ref Rng & Units 03/19/2020 03/10/2020 03/05/2020  Glucose 70 - 99 mg/dL - - -  BUN 4 - 21 31(A) 21 19  Creatinine 0.6 - 1.3 1.5(A) 1.2 1.4(A)  Sodium 137 - 147 136(A) 137 129(A)  Potassium 3.4 - 5.3 4.3 4.5 5.0  Chloride 99 - 108 103 105 97(A)  CO2 13 - 22 21 21 22   Calcium 8.7 - 10.7 9.4 9.3 9.4  Alkaline Phos 25 - 125 97 80 89  AST 14 - 40 22 20 23   ALT 10 - 40 19 23 31      STUDIES:   He underwent a CT chest, abdomen and pelvis on 04/08/2020 showing: 1. Mild interval progression of bilateral pulmonary metastases. 2. Slight interval increase in size of bilateral external iliac lymph nodes although they remain within normal limits for size.  Close attention on follow-up recommended. 3. Status post left nephrectomy. No evidence for new or suspicious soft tissue in the nephrectomy bed. 4. Aortic Atherosclerosis (ICD10-I70.0).   Allergies:  Allergies  Allergen Reactions  . Hydrochlorothiazide Swelling  . Lisinopril Swelling    Angioedema of lips  . Tramadol     Current Medications: Current Outpatient Medications  Medication Sig Dispense Refill  . allopurinol (ZYLOPRIM) 100 MG tablet Take 100 mg by mouth daily.    Marland Kitchen amLODipine (NORVASC) 10 MG tablet Take 10 mg by mouth daily.    . calcium citrate-vitamin  D (CITRACAL+D) 315-200 MG-UNIT tablet Take 1 tablet by mouth 2 (two) times daily.    . cloNIDine (CATAPRES) 0.1 MG tablet Take 0.1 mg by mouth 2 (two) times daily.    . Denosumab (XGEVA Nebo) Inject into the skin.    . hydrochlorothiazide (HYDRODIURIL) 25 MG tablet Take 25 mg by mouth daily.    . hydrocortisone cream 1 % Apply 1 application topically 2 (two) times daily.    Marland Kitchen lactulose (CHRONULAC) 10 GM/15ML solution Take 10 g by mouth at bedtime as needed for mild constipation.     . metoprolol tartrate (LOPRESSOR) 25 MG tablet Take 25 mg by mouth 2 (two) times daily.    . mirtazapine (REMERON SOL-TAB) 15 MG disintegrating tablet Take 15 mg by mouth at bedtime.    . Multiple Vitamins-Minerals (MULTIVITAMIN WITH MINERALS) tablet Take 1 tablet by mouth daily.    Marland Kitchen olmesartan (BENICAR) 20 MG tablet Take 20 mg by mouth daily.    . ondansetron (ZOFRAN) 4 MG tablet Take 4 mg by mouth every 8 (eight) hours as needed for nausea or vomiting.    . Oxycodone HCl 10 MG TABS Take 1 tablet (10 mg total) by mouth every 4 (four) hours as needed (pain). 15 tablet 0  . prochlorperazine (COMPAZINE) 10 MG tablet Take 10 mg by mouth every 6 (six) hours as needed for nausea or vomiting.    . traZODone (DESYREL) 50 MG tablet Take 50 mg by mouth at bedtime.    . triamcinolone (KENALOG) 0.025 % cream Apply 1 application topically 2 (two) times daily.    . vitamin B-12 (CYANOCOBALAMIN) 100 MCG tablet Take 100 mcg by mouth daily.    . Zoledronic Acid (ZOMETA) 4 MG/100ML IVPB Inject 4 mg into the vein.     No current facility-administered medications for this visit.     ASSESSMENT & PLAN:   Assessment:   1. Stage III renal cell carcinoma status post radical left nephrectomy.  Due to the stage and renal vein invasion, we recommended adjuvant therapy with sunitinib, which he could not tolerate due to severe uncontrolled hypertension.  He was then switched to ipilimumab/nivolumab, but unfortunately had progressive disease.  He was taking oral cabozantinib, but had recurrent hypertension after just 2 weeks of therapy.  Cabozantinib was held due to hypertension, then resumed once the hypertension was controlled.  Due to progressive vitiligo, the patient discontinue cabozantinib.  He was placed on axitinib 5 mg twice daily and has since stopped himself due to toxicities including bilateral lower extremity and right hand edema, shortness of breath, fatigue and hoarseness.  2. Anemia, which has improved,  likely due to dehydration.  We will continue to monitor this. Hemoglobin stable today at 10.1 since last visit.  3. Vitamin-D deficiency, he continues vitamin-D 3 2000 international units daily.  4. Hypertension, which has been very difficult to control.  He remains on multiple medications.  His blood pressure prior to starting cabozantinib was 126/81. He states his PCP stopped all but one blood pressure medicine; however, he can't remember which one and only takes it occasionally  5. Recurrent renal cell carcinoma with PET positive pulmonary nodules.  CT imaging July revealed new and enlarging pulmonary nodules.  He was receiving palliative cabozantinib, which is now discontinued due to dermatologic toxicities.  He was started on axitinib, but has since discontinued that due to toxicities.  6. Mildly abnormal kidney function, in part due to dehydration, improved.    7. Destructive metastatic lesion in the  right humeral head measuring 19 mm.  He received palliative radiation with better control of his pain.  He will continue Tylenol as needed.  We will proceed with denosumab today. He continues to have pain. He refuses physical therapy.  8. Episode of hemoptysis in late June.  CTA imaging revealed pulmonary metastases with progression from March 2021.  Apical segment right lower lobe nodule now measures 17 mm, previously 10 mm.  Mild ground-glass opacity around a right lower lobe nodule measuring 22 mm, previously 14 mm.  CT imaging from July also confirmed worsening disease.  He has not had further episodes of hemoptysis.  9. Splenic lesion measuring 5.6 x 3.0 cm, which is suspicious for metastatic disease.   10. Gout, I will place him on allopurinol 100 mg daily.    Plan: CT imaging has shown mild interval progression of bilateral pulmonary metastases, and slight interval increase in size of bilateral external iliac lymph nodes although they remain within normal limits for size.  No evidence for  new or suspicious soft tissue in the nephrectomy bed.  He stopped his axitinib 3 weeks to 1 month ago, and has been doing well.  We discussed alternative treatment options today including oral and systemic chemotherapy.  He has had his port removed and wishes to continue oral therapy.  I recommend that we at least try the axitinib again but at a lower dose of 3 mg BID.  He is agreeable and knows to call if he has recurrent toxicities.  In regards to his shoulder pain, we will continue with denosumab injections.  I suggested evaluation with physical therapy to assist in range of motion exercises, and he is agreeable with referral.  We will see him back in 3 weeks with CBC and CMP.  Both he and his significant other, who was present for this visit, verbalize understanding of and agreement to the plans discussed today.  He knows to call the office should any new questions or concerns arise.    I provided 20 minutes of face-to-face time during this this encounter and > 50% was spent counseling as documented under my assessment and plan.    Derwood Kaplan, MD [facility] [address] [place] [address] Dept: [phone] Dept Fax: [phone]   I, Rita Ohara, am acting as scribe for Derwood Kaplan, MD  I have reviewed this report as typed by the medical scribe, and it is complete and accurate.

## 2020-04-11 ENCOUNTER — Inpatient Hospital Stay: Payer: BC Managed Care – PPO

## 2020-04-11 VITALS — BP 138/95 | HR 95 | Temp 98.5°F | Resp 18 | Ht 70.0 in | Wt 233.5 lb

## 2020-04-11 DIAGNOSIS — C7951 Secondary malignant neoplasm of bone: Secondary | ICD-10-CM

## 2020-04-11 MED ORDER — DENOSUMAB 120 MG/1.7ML ~~LOC~~ SOLN
120.0000 mg | Freq: Once | SUBCUTANEOUS | Status: AC
  Administered 2020-04-11: 120 mg via SUBCUTANEOUS

## 2020-04-11 MED ORDER — DENOSUMAB 120 MG/1.7ML ~~LOC~~ SOLN
SUBCUTANEOUS | Status: AC
  Filled 2020-04-11: qty 1.7

## 2020-04-11 NOTE — Patient Instructions (Signed)
Denosumab injection What is this medicine? DENOSUMAB (den oh sue mab) slows bone breakdown. Prolia is used to treat osteoporosis in women after menopause and in men, and in people who are taking corticosteroids for 6 months or more. Xgeva is used to treat a high calcium level due to cancer and to prevent bone fractures and other bone problems caused by multiple myeloma or cancer bone metastases. Xgeva is also used to treat giant cell tumor of the bone. This medicine may be used for other purposes; ask your health care provider or pharmacist if you have questions. COMMON BRAND NAME(S): Prolia, XGEVA What should I tell my health care provider before I take this medicine? They need to know if you have any of these conditions:  dental disease  having surgery or tooth extraction  infection  kidney disease  low levels of calcium or Vitamin D in the blood  malnutrition  on hemodialysis  skin conditions or sensitivity  thyroid or parathyroid disease  an unusual reaction to denosumab, other medicines, foods, dyes, or preservatives  pregnant or trying to get pregnant  breast-feeding How should I use this medicine? This medicine is for injection under the skin. It is given by a health care professional in a hospital or clinic setting. A special MedGuide will be given to you before each treatment. Be sure to read this information carefully each time. For Prolia, talk to your pediatrician regarding the use of this medicine in children. Special care may be needed. For Xgeva, talk to your pediatrician regarding the use of this medicine in children. While this drug may be prescribed for children as young as 13 years for selected conditions, precautions do apply. Overdosage: If you think you have taken too much of this medicine contact a poison control center or emergency room at once. NOTE: This medicine is only for you. Do not share this medicine with others. What if I miss a dose? It is  important not to miss your dose. Call your doctor or health care professional if you are unable to keep an appointment. What may interact with this medicine? Do not take this medicine with any of the following medications:  other medicines containing denosumab This medicine may also interact with the following medications:  medicines that lower your chance of fighting infection  steroid medicines like prednisone or cortisone This list may not describe all possible interactions. Give your health care provider a list of all the medicines, herbs, non-prescription drugs, or dietary supplements you use. Also tell them if you smoke, drink alcohol, or use illegal drugs. Some items may interact with your medicine. What should I watch for while using this medicine? Visit your doctor or health care professional for regular checks on your progress. Your doctor or health care professional may order blood tests and other tests to see how you are doing. Call your doctor or health care professional for advice if you get a fever, chills or sore throat, or other symptoms of a cold or flu. Do not treat yourself. This drug may decrease your body's ability to fight infection. Try to avoid being around people who are sick. You should make sure you get enough calcium and vitamin D while you are taking this medicine, unless your doctor tells you not to. Discuss the foods you eat and the vitamins you take with your health care professional. See your dentist regularly. Brush and floss your teeth as directed. Before you have any dental work done, tell your dentist you are   receiving this medicine. Do not become pregnant while taking this medicine or for 5 months after stopping it. Talk with your doctor or health care professional about your birth control options while taking this medicine. Women should inform their doctor if they wish to become pregnant or think they might be pregnant. There is a potential for serious side  effects to an unborn child. Talk to your health care professional or pharmacist for more information. What side effects may I notice from receiving this medicine? Side effects that you should report to your doctor or health care professional as soon as possible:  allergic reactions like skin rash, itching or hives, swelling of the face, lips, or tongue  bone pain  breathing problems  dizziness  jaw pain, especially after dental work  redness, blistering, peeling of the skin  signs and symptoms of infection like fever or chills; cough; sore throat; pain or trouble passing urine  signs of low calcium like fast heartbeat, muscle cramps or muscle pain; pain, tingling, numbness in the hands or feet; seizures  unusual bleeding or bruising  unusually weak or tired Side effects that usually do not require medical attention (report to your doctor or health care professional if they continue or are bothersome):  constipation  diarrhea  headache  joint pain  loss of appetite  muscle pain  runny nose  tiredness  upset stomach This list may not describe all possible side effects. Call your doctor for medical advice about side effects. You may report side effects to FDA at 1-800-FDA-1088. Where should I keep my medicine? This medicine is only given in a clinic, doctor's office, or other health care setting and will not be stored at home. NOTE: This sheet is a summary. It may not cover all possible information. If you have questions about this medicine, talk to your doctor, pharmacist, or health care provider.  2020 [employer] Standard (2017-09-23 16:10:44)

## 2020-04-11 NOTE — Progress Notes (Signed)
PT STABLE AT TIME OF DISCHARGE 

## 2020-04-14 NOTE — Progress Notes (Signed)
Sent in dose change to [facility] for patients Inlyta on 11/12. I called and spoke with [name] today, they have the dose change and the medication is ready to be sent out. Patient will need to call in, I called Steven Peck and left him a voicemail with [facility]'s number and told him he could go ahead and call to schedule.

## 2020-04-16 ENCOUNTER — Telehealth: Payer: Self-pay

## 2020-04-16 NOTE — Telephone Encounter (Addendum)
Pt Inlyta dosage was decreased to Inlyta 3mg  po BID. I attempted call to pt, to see if he has received new shipment, & if so --when did he restart @ new dosage. [phone]    Pt LVM on triage line @ 1545 04/17/2020, that he is to get Inlyta shipment on Friday, & he will start the same day. Johanna Matto

## 2020-04-21 ENCOUNTER — Telehealth: Payer: Self-pay

## 2020-04-21 ENCOUNTER — Other Ambulatory Visit: Payer: Self-pay | Admitting: Hematology and Oncology

## 2020-04-21 ENCOUNTER — Other Ambulatory Visit: Payer: Self-pay | Admitting: Oncology

## 2020-04-21 DIAGNOSIS — C642 Malignant neoplasm of left kidney, except renal pelvis: Secondary | ICD-10-CM

## 2020-04-21 NOTE — Telephone Encounter (Addendum)
Pt called this afternoon, reporting his voice being hoarse (which I could tell), increased shortness of breath on exertion, & fatigue. He started the new Inlyta 3 mg on Sat (ordered for q12 hr). He states a while after he took the 1st dose, his throat felt like something was lodged in it". So he didn't take the evening dose. Sunday morning, he wanted to try it again, so he took the am dose.  During the day he noticed the increase in SOB, & voice becoming hoarse. He hasn't taken any doses today. So he has had only 2 doses of the Inlyta 3mg . Pt mentioned if he could get the medication from another company, not [facility], as maybe its where they get their medication from.  I called Melissa,NP, she told pt to hold Inlyta, until we can discuss with Dr Hinton Rao. [phone]

## 2020-04-22 ENCOUNTER — Ambulatory Visit: Payer: BC Managed Care – PPO | Admitting: Hematology and Oncology

## 2020-04-22 DIAGNOSIS — R0602 Shortness of breath: Secondary | ICD-10-CM | POA: Diagnosis not present

## 2020-04-22 DIAGNOSIS — C642 Malignant neoplasm of left kidney, except renal pelvis: Secondary | ICD-10-CM | POA: Diagnosis not present

## 2020-04-22 DIAGNOSIS — I2699 Other pulmonary embolism without acute cor pulmonale: Secondary | ICD-10-CM | POA: Diagnosis not present

## 2020-04-22 DIAGNOSIS — R911 Solitary pulmonary nodule: Secondary | ICD-10-CM | POA: Diagnosis not present

## 2020-04-22 NOTE — Telephone Encounter (Signed)
-----   Message from Derwood Kaplan, MD sent at 04/22/2020  9:49 AM EST ----- Regarding: appt He needs appt. with me or Melissa.  Labs and CT chest have been ordered.  I am leaving at 1 pm

## 2020-04-22 NOTE — Telephone Encounter (Addendum)
Attempted call to pt, awaiting return call.  ----- Message from Melodye Ped, NP sent at 04/21/2020  5:32 PM EST ----- Regarding: RE: Inlyta symptoms I'll go ahead and order chest CT so that they can work on the authorization first thing.  ----- Message ----- From: Derwood Kaplan, MD Sent: 04/21/2020   5:15 PM EST To: Melodye Ped, NP, Amelita Risinger Cherylann Banas, RN Subject: RE: Inlyta symptoms                            Yes, he needs to stop drug.  Only 1 company makes it right now, relatively new.  But I think melissa or I should see pt tomorrow with labs and may need chest CT ----- Message ----- From: Dairl Ponder, RN Sent: 04/21/2020   5:02 PM EST To: Derwood Kaplan, MD, Melodye Ped, NP Subject: Bartholomew Boards symptoms                                Pt called this afternoon, reporting his voice being hoarse (which I could tell), increased shortness of breath on exertion, & fatigue. He started the new Inlyta 3 mg on Sat (ordered for q12 hr). He states a while after he took the 1st dose, his throat felt like something was lodged in it". So he didn't take the evening dose. Sunday morning, he wanted to try it again, so he took the am dose.  During the day he noticed the increase in SOB, & voice becoming hoarse. He hasn't taken any doses today. So he has had only 2 doses of the Inlyta 3mg . Pt mentioned if he could get the medication from another company, not [facility], as maybe its where they get their medication from.  I called Melissa,NP, she told pt to hold Inlyta, until we can discuss wioth Dr Hinton Rao. [phone]

## 2020-04-22 NOTE — Telephone Encounter (Addendum)
Attempted call to pt, no answer. I did LVM on identified answering machine to return my call asap. [phone]   ----- Message from Derwood Kaplan, MD sent at 04/21/2020  5:15 PM EST ----- Regarding: RE: Inlyta symptoms Yes, he needs to stop drug.  Only 1 company makes it right now, relatively new.  But I think melissa or I should see pt tomorrow with labs and may need chest CT ----- Message ----- From: Dairl Ponder, RN Sent: 04/21/2020   5:02 PM EST To: Derwood Kaplan, MD, Melodye Ped, NP Subject: Steven Peck symptoms                                Pt called this afternoon, reporting his voice being hoarse (which I could tell), increased shortness of breath on exertion, & fatigue. He started the new Inlyta 3 mg on Sat (ordered for q12 hr). He states a while after he took the 1st dose, his throat felt like something was lodged in it". So he didn't take the evening dose. Sunday morning, he wanted to try it again, so he took the am dose.  During the day he noticed the increase in SOB, & voice becoming hoarse. He hasn't taken any doses today. So he has had only 2 doses of the Inlyta 3mg . Pt mentioned if he could get the medication from another company, not [facility], as maybe its where they get their medication from.  I called Melissa,NP, she told pt to hold Inlyta, until we can discuss wioth Dr Hinton Rao. [phone]

## 2020-04-28 NOTE — Progress Notes (Deleted)
[facility]  [address] [place],  [place]  [id number] [phone]  Clinic Day:  04/28/2020  Referring physician: Lind Guest, NP   This document serves as a record of services personally performed by [name], MD. It was created on their behalf by [name],[name], a trained medical scribe. The creation of this record is based on the scribe's personal observations and the provider's statements to them.   CHIEF COMPLAINT:  CC: Stage III renal cell carcinoma  Current Treatment:  Denosumab   HISTORY OF PRESENT ILLNESS:  Steven Peck is a 54 y.o. 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  We began seeing him in May 2020 when he was referred by [name], NP, for a left renal mass and iron deficiency anemia.  He had transient hematuria, which later resolved, and weight loss.  In early May 2020, his hemoglobin was 11.3 with an MCV of 75. PSA was normal.  He had hypercalcemia with a calcium of 12.8.  Repeat labs a few days later revealed persistent hypercalcemia with a calcium of 13.  PTH was low at 6. Vitamin-D was low at 17.1.  He was started on vitamin D supplementation.  His hemoglobin continued to decrease to 10.8.  Serum protein electrophoresis was negative for a monoclonal spike but his gamma globulins were elevated at 4.4.  A random urine protein electrophoresis was also negative for monoclonal spike.  He was given hydrocodone/APAP 7.5/325 every 6 hours as needed for pain.  CT abdomen and pelvis revealed marked generalized swelling of the left kidney with hyperdensity throughout the renal collecting system, renal pelvis, ureter and posterior bladder consistent with gross hemorrhage with a 6-7 cm infiltrating mass in the upper pole of the left kidney.  There was no evidence of venous invasion.  There were abnormal nodes in the retroperitoneum medial to the left kidney, up to 1.5 cm in diameter.  Repeat  calcium in our office at the end of May was down to 11.6.  When we saw him in early June, his hemoglobin has dropped from 10 to 9.5.  B12 was low normal and he was placed on oral vitamin B12 1000 mcg daily.  His calcium had increased to 13, so he was given IV fluids and IV zoledronic acid 4 mg.  He reported persistent pain in his left flank and left upper inner thigh, which he continue to rate 10/10, despite the hydrocodone/APAP.  We switched him to oxycodone 10 mg every 4 hours as needed.  PET scan revealed malignancy of the kidney, but no definite evidence of metastasis.  There were two right pulmonary nodules measuring 3 mm.  MRI brain did not reveal any intracranial metastasis.  He underwent biopsy of the left kidney mass in June which revealed renal cell carcinoma.  He underwent left radical nephrectomy in August.  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  11 lymph nodes were negative for metastasis.  Tumor extended into the renal vein, but margins were clear.  Due to his high risk of recurrence, he was was recommended for adjuvant oral chemotherapy with sunitinib 50 mg daily for 2 weeks on and 1 week off for total of 1 year.  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  He was evaluated in the emergency department and placed on clonidine 0.2 mg 3 times daily.  The hypertension was uncontrolled  with this, and other medications were tried.  Sunitinib was therefore discontinued after only 2 weeks of therapy.  He saw Dr. [name] for routine follow-up in February of 2021.  At that time, CT abdomen and pelvis revealed basilar pulmonary nodules, which had increased in size and number, and were felt to be highly suspicious for metastatic disease.  There was a persistent splenic lesion, once again felt to be benign.  He was referred back to [place].  He had worsening anemia and iron panel and ferritin were equivocal, but a soluble  transferrin was normal, so inconsistent with iron deficiency.  PET scan revealed several of the pulmonary nodules to be hypermetabolic, consistent with metastatic disease.  There was no evidence of recurrent or metastatic disease within the abdomen and pelvis.  We recommended palliative immunotherapy with ipilimumab/nivolumab for 4 cycles to be followed by maintenance nivolumab.  He experienced severe pain of his right shoulder and down his right arm with associated swelling.  He is unable to fully lift his arm, and his right neck is also swollen.  Ultrasound was negative for thrombosis.  His Port-A-Cath was then removed.  He was seen in clinic at the end of the day on July 1st., after he had been seen in the emergency department earlier in the day because of mild hemoptysis.  CTA chest with contrast revealed pulmonary metastases with mild progression from March 2021.  The apical segment right lower lobe nodule was 17 mm, previously 10 mm.  Mild ground-glass opacity around a right lower lobe nodule measuring 22 mm, previously 14 mm.  It was unclear if this represents pseudoprogression or true progression.  He was still complaining of severe right shoulder pain, so x-rays were ordered, and revealed a large lucent lesion in the right humeral head with cortical destruction, as well as moderate degenerative changes of the acromioclavicular and glenohumeral joints.  CT chest, abdomen and pelvis from July 2nd revealed a destructive metastatic lesion of the right proximal humerus measuring 19 mm.  Numerous pulmonary nodules were again observed, similar to the prior exam on June 30th, but new and enlarging nodules overall since March 2021.  Splenic lesion measuring 5.6 x 3.0 cm, previously 4.7 x 5.0 cm, is indeterminate, but this is suspicious for metastasis despite lack of FDG uptake, as it has enlarged since previous imaging.  Nivolumab was discontinued.  He saw Dr. [name] and received palliative radiation to the right  humeral head lesion, completed on July 20th.    Due to his progressive disease he was switched to palliative oral chemotherapy with cabozantinib 60 mg daily which he started on July 21st.  This was placed on hold due to uncontrolled hypertension.  He also had hypopigmentation of the perioral area, as well as scattered areas on his back.  He discontinued tramadol as this was causing severe headaches.  He uses hydrocodone/APAP as needed for pain.  He resumed cabozantinib on August 12th, once his blood pressure was controlled.  He had mild hyperkalemia at that time, so we increased his HCTZ to 2 tabs daily.  He has had persistent right shoulder pain, for which he uses Tylenol, as he did not tolerate any opioids.  We added mirtazapine 15 mg at bedtime for insomnia in September.  He discontinued cabozantinib in September weeks ago due to progressive vitiligo.  We recommended alternative therapy with axitinib 5 mg twice daily.  He continues denosumab every 4 weeks.  Repeat CT imaging was done prior to starting axitinib in revealed  a decrease in his pulmonary nodules, without evidence of new metastatic disease in the lungs or abdomen and pelvis.  He presents to clinic today for follow-up.  He was evaluated in the clinic two weeks ago for increasing edema to bilateral hands, ankles and feet.  He was started on Lasix.  He was also found to have an elevated uric acid consistent with gout which he claims he has dealt with in the past.  Today he states the swelling has decreased minimally and the pain continues in both ankles, knees and bottoms of his feet.  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 He also continues to have pain in his right shoulder and reports decreased range of motion due to pain. We restarted his axitinib two weeks ago; however, the patient called last week to say that his symptoms of  shortness of breath, fatigue and hoarseness  recurred so he stopped his axitinib on his own.He also reports one episode of coughing up bloody sputum. His denies shortness of breath, cough, chest pain or hoarseness today. He denies fever, chills, nausea or vomiting. He denies issue with bowel or bladder. CBC today is stable from last visit. CMP reveals BUN 29 and creatinine 1.4, slightly decreased since last visit. He states his PCP has stopped all of his blood pressure medicines except one, but he doesn't remember which one he was to continue and admits to only taking it when he feels poorly. His appetite and energy have improved since stopping his medications.    INTERVAL HISTORY:  [place] is here for routine follow up and to discuss recent imaging results.  CT imaging from November 9th revealed mild interval progression of bilateral pulmonary metastases, and slight interval increase in size of bilateral external iliac lymph nodes although they remain within normal limits for size.  No evidence for new or suspicious soft tissue in the nephrectomy bed.  He has been off of treatment with axitinib for 3 weeks to 1 month now, and is doing well.  This was causing him shortness of breath, fatigue and hoarseness.  He continues to have mild right shoulder pain, and uses Tylenol to treat.  His hemoglobin is stable at 10.0, and his white count and platelets are normal.  Chemistries are unremarkable except for a BUN of 21, improved and a creatinine of 1.3, improved.  His  appetite is good, and he has gained 1 pounds since his last visit.  He denies fever, chills or other signs of infection.  He denies nausea, vomiting, bowel issues, or abdominal pain.  He denies sore throat, cough, dyspnea, or chest pain.   REVIEW OF SYSTEMS:  Review of Systems - Oncology   VITALS:  There were no vitals taken for this visit.  Wt Readings from Last 3 Encounters:  04/11/20 233 lb 8 oz (105.9 kg)  04/10/20 235 lb (106.6 kg)   04/01/20 234 lb 7 oz (106.3 kg)    There is no height or weight on file to calculate BMI.  Performance status (ECOG): 1 - Symptomatic but completely ambulatory  PHYSICAL EXAM:  Physical Exam  LABS:   His hemoglobin is stable at 10.0, and his white count and platelets are normal.  Chemistries are unremarkable except for a BUN of 21, improved and a creatinine of 1.3, improved  CBC Latest Ref Rng & Units 03/19/2020 03/10/2020 03/05/2020  WBC -  6.6 6.5 7.1  Hemoglobin 13.5 - 17.5 10.6(A) 10.8(A) 12.2(A)  Hematocrit 41 - 53 32(A) 33(A) 38(A)  Platelets 150 - 399 377 305 299   CMP Latest Ref Rng & Units 03/19/2020 03/10/2020 03/05/2020  Glucose 70 - 99 mg/dL - - -  BUN 4 - 21 31(A) 21 19  Creatinine 0.6 - 1.3 1.5(A) 1.2 1.4(A)  Sodium 137 - 147 136(A) 137 129(A)  Potassium 3.4 - 5.3 4.3 4.5 5.0  Chloride 99 - 108 103 105 97(A)  CO2 13 - _0 Calcium 8.7 - 10.7 9.4 9.3 9.4  Alkaline Phos 25 - 125 97 80 89  AST 14 - 40 _1 ALT 10 - 40 _2 STUDIES:   He underwent a CT chest, abdomen and pelvis on 04/08/2020 showing: 1. Mild interval progression of bilateral pulmonary metastases. 2. Slight interval increase in size of bilateral external iliac lymph nodes although they remain within normal limits for size.  Close attention on follow-up recommended. 3. Status post left nephrectomy. No evidence for new or suspicious soft tissue in the nephrectomy bed. 4. Aortic Atherosclerosis (ICD10-I70.0).   Allergies:  Allergies  Allergen Reactions  . Hydrochlorothiazide Swelling  . Lisinopril Swelling    Angioedema of lips  . Tramadol     Current Medications: Current Outpatient Medications  Medication Sig Dispense Refill  . allopurinol (ZYLOPRIM) 100 MG tablet Take 100 mg by mouth daily.    Marland Kitchen amLODipine (NORVASC) 10 MG tablet Take 10 mg by mouth daily.    . ANUCORT-HC 25 MG suppository Place 25 mg rectally 2 (two) times daily as needed.    . CABOMETYX 60 MG  tablet Take 60 mg by mouth at bedtime.    . calcium citrate-vitamin D (CITRACAL+D) 315-200 MG-UNIT tablet Take 1 tablet by mouth 2 (two) times daily.    . cloNIDine (CATAPRES) 0.1 MG tablet Take 0.1 mg by mouth 2 (two) times daily.    . Denosumab (XGEVA [place]) Inject into the skin.    . hydrochlorothiazide (HYDRODIURIL) 25 MG tablet Take 25 mg by mouth daily.    Marland Kitchen HYDROcodone-acetaminophen (NORCO/VICODIN) 5-325 MG tablet Take 1-2 tablets by mouth. for pain    . hydrocortisone cream 1 % Apply 1 application topically 2 (two) times daily.    . INLYTA 5 MG tablet Take by mouth.    . lactulose (CHRONULAC) 10 GM/15ML solution Take 10 g by mouth at bedtime as needed for mild constipation.    . metoprolol tartrate (LOPRESSOR) 25 MG tablet Take 25 mg by mouth 2 (two) times daily.    . mirtazapine (REMERON SOL-TAB) 15 MG disintegrating tablet Take 15 mg by mouth at bedtime.    . mirtazapine (REMERON) 15 MG tablet Take 15 mg by mouth at bedtime.    . Multiple Vitamins-Minerals (MULTIVITAMIN WITH MINERALS) tablet Take 1 tablet by mouth daily.    Marland Kitchen NARCAN 4 MG/0.1ML LIQD nasal spray kit SMARTSIG:1 Both Nares Daily PRN    . olmesartan (BENICAR) 20 MG tablet Take 20 mg by mouth daily.    . ondansetron (ZOFRAN) 4 MG tablet Take 4 mg by mouth every 8 (eight) hours as needed for nausea or vomiting.    . Oxycodone HCl 10 MG TABS Take 1 tablet (10 mg total) by mouth every 4 (four) hours as needed (pain). 15 tablet 0  . prochlorperazine (COMPAZINE) 10 MG tablet Take 10 mg by mouth every 6 (six) hours as needed for  nausea or vomiting.    . traMADol (ULTRAM) 50 MG tablet Take 50 mg by mouth every 6 (six) hours as needed.    . traZODone (DESYREL) 50 MG tablet Take 50 mg by mouth at bedtime.    . triamcinolone (KENALOG) 0.025 % cream Apply 1 application topically 2 (two) times daily.    . vitamin B-12 (CYANOCOBALAMIN) 100 MCG tablet Take 100 mcg by mouth daily.    . Zoledronic Acid (ZOMETA) 4 MG/100ML IVPB Inject 4 mg  into the vein.     No current facility-administered medications for this visit.     ASSESSMENT & PLAN:   Assessment:   1. Stage III renal cell carcinoma status post radical left nephrectomy.  Due to the stage and renal vein invasion, we recommended adjuvant therapy with sunitinib, which he could not tolerate due to severe uncontrolled hypertension.  He was then switched to ipilimumab/nivolumab, but unfortunately had progressive disease.  He was taking oral cabozantinib, but had recurrent hypertension after just 2 weeks of therapy.  Cabozantinib was held due to hypertension, then resumed once the hypertension was controlled.  Due to progressive vitiligo, the patient discontinue cabozantinib.  He was placed on axitinib 5 mg twice daily and has since stopped himself due to toxicities including bilateral lower extremity and right hand edema, shortness of breath, fatigue and hoarseness.  2. Anemia, which has improved, likely due to dehydration.  We will continue to monitor this. Hemoglobin stable today at 10.1 since last visit.  3. Vitamin-D deficiency, he continues vitamin-D 3 2000 international units daily.  4. Hypertension, which has been very difficult to control.  He remains on multiple medications.  His blood pressure prior to starting cabozantinib was 126/81. He states his PCP stopped all but one blood pressure medicine; however, he can't remember which one and only takes it occasionally  5. Recurrent renal cell carcinoma with PET positive pulmonary nodules.  CT imaging July revealed new and enlarging pulmonary nodules.  He was receiving palliative cabozantinib, which is now discontinued due to dermatologic toxicities.  He was started on axitinib, but has since discontinued that due to toxicities.  6. Mildly abnormal kidney function, in part due to dehydration, improved.    7. Destructive metastatic lesion in the right humeral head measuring 19 mm.  He received palliative radiation with better  control of his pain.  He will continue Tylenol as needed.  We will proceed with denosumab today. He continues to have pain. He refuses physical therapy.  8. Episode of hemoptysis in late June.  CTA imaging revealed pulmonary metastases with progression from March 2021.  Apical segment right lower lobe nodule now measures 17 mm, previously 10 mm.  Mild ground-glass opacity around a right lower lobe nodule measuring 22 mm, previously 14 mm.  CT imaging from July also confirmed worsening disease.  He has not had further episodes of hemoptysis.  9. Splenic lesion measuring 5.6 x 3.0 cm, which is suspicious for metastatic disease.   10. Gout, I will place him on allopurinol 100 mg daily.    Plan: CT imaging has shown mild interval progression of bilateral pulmonary metastases, and slight interval increase in size of bilateral external iliac lymph nodes although they remain within normal limits for size.  No evidence for new or suspicious soft tissue in the nephrectomy bed.  He stopped his axitinib 3 weeks to 1 month ago, and has been doing well.  We discussed alternative treatment options today including oral and systemic chemotherapy.  He  has had his port removed and wishes to continue oral therapy.  I recommend that we at least try the axitinib again but at a lower dose of 3 mg BID.  He is agreeable and knows to call if he has recurrent toxicities.  In regards to his shoulder pain, we will continue with denosumab injections.  I suggested evaluation with physical therapy to assist in range of motion exercises, and he is agreeable with referral.  We will see him back in 3 weeks with CBC and CMP.  Both he and his significant other, who was present for this visit, verbalize understanding of and agreement to the plans discussed today.  He knows to call the office should any new questions or concerns arise.    I provided 20 minutes of face-to-face time during this this encounter and > 50% was spent counseling as  documented under my assessment and plan.    Melodye Ped, NP [name] [address] [place] [address] Dept: [phone] Dept Fax: [phone]   I, Rita Ohara, am acting as scribe for Derwood Kaplan, MD  I have reviewed this report as typed by the medical scribe, and it is complete and accurate.

## 2020-05-01 ENCOUNTER — Inpatient Hospital Stay: Payer: BC Managed Care – PPO

## 2020-05-01 ENCOUNTER — Inpatient Hospital Stay: Payer: BC Managed Care – PPO | Admitting: Hematology and Oncology

## 2020-05-02 NOTE — Progress Notes (Signed)
PT STABLE AT TIME OF DISCHARGE 

## 2020-05-05 NOTE — Progress Notes (Signed)
[facility]  [address],  [place]  [address] [phone]  Clinic Day:  05/06/2020  Referring physician: Lind Guest, NP    CHIEF COMPLAINT:  CC: Stage III renal cell carcinoma  Current Treatment:  Denosumab   HISTORY OF PRESENT ILLNESS:  Jaran Sainz is a 54 y.o. 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  We began seeing him in May 2020 when he was referred by [name], NP, for a left renal mass and iron deficiency anemia.  He had transient hematuria, which later resolved, and weight loss.  In early May 2020, his hemoglobin was 11.3 with an MCV of 75. PSA was normal.  He had hypercalcemia with a calcium of 12.8.  Repeat labs a few days later revealed persistent hypercalcemia with a calcium of 13.  PTH was low at 6. Vitamin-D was low at 17.1.  He was started on vitamin D supplementation.  His hemoglobin continued to decrease to 10.8.  Serum protein electrophoresis was negative for a monoclonal spike but his gamma globulins were elevated at 4.4.  A random urine protein electrophoresis was also negative for monoclonal spike.  He was given hydrocodone/APAP 7.5/325 every 6 hours as needed for pain.  CT abdomen and pelvis revealed marked generalized swelling of the left kidney with hyperdensity throughout the renal collecting system, renal pelvis, ureter and posterior bladder consistent with gross hemorrhage with a 6-7 cm infiltrating mass in the upper pole of the left kidney.  There was no evidence of venous invasion.  There were abnormal nodes in the retroperitoneum medial to the left kidney, up to 1.5 cm in diameter.  Repeat calcium in our office at the end of May was down to 11.6.  When we saw him in early June, his hemoglobin has dropped from 10 to 9.5.  B12 was low normal and he was placed on oral vitamin B12 1000 mcg daily.  His calcium had increased to 13, so he was given IV fluids and IV zoledronic acid 4  mg.  He reported persistent pain in his left flank and left upper inner thigh, which he continue to rate 10/10, despite the hydrocodone/APAP.  We switched him to oxycodone 10 mg every 4 hours as needed.  PET scan revealed malignancy of the kidney, but no definite evidence of metastasis.  There were two right pulmonary nodules measuring 3 mm.  MRI brain did not reveal any intracranial metastasis.  He underwent biopsy of the left kidney mass in June which revealed renal cell carcinoma.  He underwent left radical nephrectomy in August.  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  11 lymph nodes were negative for metastasis.  Tumor extended into the renal vein, but margins were clear.  Due to his high risk of recurrence, he was was recommended for adjuvant oral chemotherapy with sunitinib 50 mg daily for 2 weeks on and 1 week off for total of 1 year.  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  He was evaluated in the emergency department and placed on clonidine 0.2 mg 3 times daily.  The hypertension was uncontrolled with this, and other medications were tried.  Sunitinib was therefore discontinued after only 2 weeks of therapy.  He saw Dr. [name] for routine follow-up in February of 2021.  At that time, CT abdomen and pelvis revealed basilar pulmonary nodules, which had increased in  size and number, and were felt to be highly suspicious for metastatic disease.  There was a persistent splenic lesion, once again felt to be benign.  He was referred back to [place].  He had worsening anemia and iron panel and ferritin were equivocal, but a soluble transferrin was normal, so inconsistent with iron deficiency.  PET scan revealed several of the pulmonary nodules to be hypermetabolic, consistent with metastatic disease.  There was no evidence of recurrent or metastatic disease within the abdomen and pelvis.  We recommended palliative  immunotherapy with ipilimumab/nivolumab for 4 cycles to be followed by maintenance nivolumab.  He experienced severe pain of his right shoulder and down his right arm with associated swelling.  He is unable to fully lift his arm, and his right neck is also swollen.  Ultrasound was negative for thrombosis.  His Port-A-Cath was then removed.  He was seen in clinic at the end of the day on July 1st., after he had been seen in the emergency department earlier in the day because of mild hemoptysis.  CTA chest with contrast revealed pulmonary metastases with mild progression from March 2021.  The apical segment right lower lobe nodule was 17 mm, previously 10 mm.  Mild ground-glass opacity around a right lower lobe nodule measuring 22 mm, previously 14 mm.  It was unclear if this represents pseudoprogression or true progression.  He was still complaining of severe right shoulder pain, so x-rays were ordered, and revealed a large lucent lesion in the right humeral head with cortical destruction, as well as moderate degenerative changes of the acromioclavicular and glenohumeral joints.  CT chest, abdomen and pelvis from July 2nd revealed a destructive metastatic lesion of the right proximal humerus measuring 19 mm.  Numerous pulmonary nodules were again observed, similar to the prior exam on June 30th, but new and enlarging nodules overall since March 2021.  Splenic lesion measuring 5.6 x 3.0 cm, previously 4.7 x 5.0 cm, is indeterminate, but this is suspicious for metastasis despite lack of FDG uptake, as it has enlarged since previous imaging.  Nivolumab was discontinued.  He saw Dr. [name] and received palliative radiation to the right humeral head lesion, completed on July 20th.    Due to his progressive disease he was switched to palliative oral chemotherapy with cabozantinib 60 mg daily which he started on July 21st.  This was placed on hold due to uncontrolled hypertension.  He also had hypopigmentation of the  perioral area, as well as scattered areas on his back.  He discontinued tramadol as this was causing severe headaches.  He uses hydrocodone/APAP as needed for pain.  He resumed cabozantinib on August 12th, once his blood pressure was controlled.  He had mild hyperkalemia at that time, so we increased his HCTZ to 2 tabs daily.  He has had persistent right shoulder pain, for which he uses Tylenol, as he did not tolerate any opioids.  We added mirtazapine 15 mg at bedtime for insomnia in September.  He discontinued cabozantinib in September weeks ago due to progressive vitiligo.  We recommended alternative therapy with axitinib 5 mg twice daily.  He continues denosumab every 4 weeks.  Repeat CT imaging was done prior to starting axitinib in revealed a decrease in his pulmonary nodules, without evidence of new metastatic disease in the lungs or abdomen and pelvis.  He presents to clinic today for follow-up.  He was evaluated in the clinic two weeks ago for increasing edema to bilateral hands, ankles and feet.  He was started on Lasix.  He was also found to have an elevated uric acid consistent with gout which he claims he has dealt with in the past.  Today he states the swelling has decreased minimally and the pain continues in both ankles, knees and bottoms of his feet.  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 He also continues to have pain in his right shoulder and reports decreased range of motion due to pain. We restarted his axitinib two weeks ago; however, the patient called last week to say that his symptoms of shortness of breath, fatigue and hoarseness  recurred so he stopped his axitinib on his own.He also reports one episode of coughing up bloody sputum. His denies shortness of breath, cough, chest pain or hoarseness today. He denies fever, chills, nausea or vomiting. He denies issue with  bowel or bladder. CBC today is stable from last visit. CMP reveals BUN 29 and creatinine 1.4, slightly decreased since last visit. He states his PCP has stopped all of his blood pressure medicines except one, but he doesn't remember which one he was to continue and admits to only taking it when he feels poorly. His appetite and energy have improved since stopping his medications.   CT imaging from November 9th revealed mild interval progression of bilateral pulmonary metastases, and slight interval increase in size of bilateral external iliac lymph nodes although they remain within normal limits for size.  No evidence for new or suspicious soft tissue in the nephrectomy bed  He was restarted on axitinib at 3 mg twice daily and experienced the same side effects including shortness of breath and edema. A CT of the chest was obtained and was read as a very limited study due to respiratory motion artifact. No large or central pulmonary artery embolus identified. Known pulmonary metastatic disease and aortic atherosclerosis. He stopped medication at that time.   INTERVAL HISTORY:  [place] is here for routine follow up. He has been off of treatment with axitinib for 3 weeks to 1 month now, and is doing well.  This was causing him shortness of breath, fatigue and hoarseness.  He continues to have mild right shoulder pain, and uses Tylenol to treat.  His hemoglobin is stable at 10.1, and his white count and platelets are normal.  Chemistries are unremarkable. His  appetite is good, and he has gained 1 pounds since his last visit.  He denies fever, chills or other signs of infection.  He denies nausea, vomiting, bowel issues, or abdominal pain.  He denies sore throat, cough, dyspnea, or chest pain. He is open to discussing additional treatment options with Dr. Hinton Rao.   REVIEW OF SYSTEMS:  Review of Systems  Constitutional: Negative for appetite change, chills, diaphoresis, fatigue, fever and unexpected weight  change.  HENT:   Negative for hearing loss, lump/mass, mouth sores, nosebleeds, sore throat, tinnitus, trouble swallowing and voice change.   Eyes: Negative for eye problems and icterus.  Respiratory: Negative for chest tightness, cough, hemoptysis, shortness of breath and wheezing.   Cardiovascular: Negative for chest pain, leg swelling and palpitations.  Gastrointestinal: Negative for abdominal distention, abdominal pain, blood in stool, constipation, diarrhea, nausea, rectal pain and vomiting.  Endocrine: Negative for hot flashes.  Genitourinary: Negative for bladder incontinence, difficulty urinating, dyspareunia, dysuria, frequency, hematuria and nocturia.   Musculoskeletal: Negative for arthralgias, back pain,  flank pain, gait problem, myalgias, neck pain and neck stiffness.  Skin: Negative for itching, rash and wound.  Neurological: Negative for dizziness, extremity weakness, gait problem, headaches, light-headedness, numbness, seizures and speech difficulty.  Hematological: Negative for adenopathy. Does not bruise/bleed easily.  Psychiatric/Behavioral: Negative for confusion, decreased concentration, depression, sleep disturbance and suicidal ideas. The patient is not nervous/anxious.      VITALS:  There were no vitals taken for this visit.  Wt Readings from Last 3 Encounters:  05/06/20 234 lb 12.8 oz (106.5 kg)  03/27/20 234 lb 7 oz (106.3 kg)  04/11/20 233 lb 8 oz (105.9 kg)    There is no height or weight on file to calculate BMI.  Performance status (ECOG): 1 - Symptomatic but completely ambulatory  PHYSICAL EXAM:  Physical Exam Constitutional:      General: He is not in acute distress.    Appearance: Normal appearance. He is normal weight. He is not ill-appearing, toxic-appearing or diaphoretic.  HENT:     Head: Normocephalic and atraumatic.     Right Ear: Tympanic membrane normal.     Left Ear: Tympanic membrane normal.     Nose: Nose normal. No congestion or  rhinorrhea.     Mouth/Throat:     Mouth: Mucous membranes are moist.     Pharynx: Oropharynx is clear. No oropharyngeal exudate or posterior oropharyngeal erythema.  Eyes:     General: No scleral icterus.       Right eye: No discharge.        Left eye: No discharge.     Extraocular Movements: Extraocular movements intact.     Conjunctiva/sclera: Conjunctivae normal.     Pupils: Pupils are equal, round, and reactive to light.  Neck:     Vascular: No carotid bruit.  Cardiovascular:     Rate and Rhythm: Normal rate and regular rhythm.     Heart sounds: No murmur heard.  No friction rub. No gallop.   Pulmonary:     Effort: Pulmonary effort is normal. No respiratory distress.     Breath sounds: Normal breath sounds. No stridor. No wheezing, rhonchi or rales.  Chest:     Chest wall: No tenderness.  Abdominal:     General: Abdomen is flat. Bowel sounds are normal. There is no distension.     Palpations: There is no mass.     Tenderness: There is no abdominal tenderness. There is no right CVA tenderness, left CVA tenderness, guarding or rebound.     Hernia: No hernia is present.  Musculoskeletal:        General: No swelling, tenderness, deformity or signs of injury. Normal range of motion.     Cervical back: Normal range of motion and neck supple. No rigidity or tenderness.     Right lower leg: No edema.     Left lower leg: No edema.  Lymphadenopathy:     Cervical: No cervical adenopathy.  Skin:    General: Skin is warm and dry.     Capillary Refill: Capillary refill takes less than 2 seconds.     Coloration: Skin is not jaundiced or pale.     Findings: No bruising, erythema, lesion or rash.  Neurological:     General: No focal deficit present.     Mental Status: He is alert and oriented to person, place, and time. Mental status is at baseline.     Cranial Nerves: No cranial nerve deficit.     Sensory: No sensory deficit.  Motor: No weakness.     Coordination: Coordination  normal.     Gait: Gait normal.     Deep Tendon Reflexes: Reflexes normal.  Psychiatric:        Mood and Affect: Mood normal.        Behavior: Behavior normal.        Thought Content: Thought content normal.        Judgment: Judgment normal.     LABS:   His hemoglobin is stable at 10.0, and his white count and platelets are normal.  Chemistries are unremarkable except for a BUN of 21, improved and a creatinine of 1.3, improved  CBC Latest Ref Rng & Units 05/06/2020 03/19/2020 03/10/2020  WBC - 4.6 6.6 6.5  Hemoglobin 13.5 - 17.5 10.1(A) 10.6(A) 10.8(A)  Hematocrit 41 - 53 32(A) 32(A) 33(A)  Platelets 150 - 399 273 377 305   CMP Latest Ref Rng & Units 05/06/2020 03/19/2020 03/10/2020  Glucose 70 - 99 mg/dL - - -  BUN 4 - 21 17 31(A) 21  Creatinine 0.6 - 1.3 1.2 1.5(A) 1.2  Sodium 137 - 147 139 136(A) 137  Potassium 3.4 - 5.3 4.2 4.3 4.5  Chloride 99 - 108 106 103 105  CO2 13 - 22 26(A) 21 21  Calcium 8.7 - 10.7 9.0 9.4 9.3  Alkaline Phos 25 - 125 101 97 80  AST 14 - 40 $Re'27 22 20  'fhv$ ALT 10 - 40 $Re'16 19 23     'vtP$ STUDIES:   Exam(s): [id number] CT/CT ANGIO CHEST CLINICAL DATA:  54 year old male with shortness of breath. History of left nephrectomy and pulmonary metastases.  EXAM: CT ANGIOGRAPHY CHEST WITH CONTRAST  TECHNIQUE: Multidetector CT imaging of the chest was performed using the standard protocol during bolus administration of intravenous contrast. Multiplanar CT image reconstructions and MIPs were obtained to evaluate the vascular anatomy.  CONTRAST:  80 cc Isovue 370  COMPARISON:  CT dated 04/08/2020.  FINDINGS: Evaluation of this exam is limited due to respiratory motion artifact.  Cardiovascular: There is no cardiomegaly or pericardial effusion. Mild atherosclerotic calcification of the aortic arch. No aneurysmal dilatation or dissection. The origins of the great vessels of the aortic arch appear patent as visualized. Evaluation of the pulmonary arteries is  very limited due to respiratory motion artifact. There is narrowing of left lower lobe posterior basal segmental pulmonary artery branch (116-120 6/3) likely representing scarring and related to prior/chronic pulmonary embolism. An acute partially occlusive PE is less likely but not excluded. Clinical correlation is recommended. No large or central pulmonary artery embolus identified.  Mediastinum/Nodes: There is no hilar or mediastinal adenopathy. The esophagus and the thyroid gland are grossly unremarkable. No mediastinal fluid collection.  Lungs/Pleura: Scattered bilateral pulmonary nodules as seen on the prior CT measuring up to 2.5 cm in the right lower lobe in keeping with known metastases. No new consolidation. There is no pleural effusion pneumothorax. The central airways are patent.  Upper Abdomen: Partially visualized 4.3 x 2.3 cm nodular soft tissue in the left upper quadrant. No mass was seen in the nephrectomy bed on the prior CT of 04/08/2020. The nodular density seen on today's CT is only partially visualized and not evaluated and may represent a portion of bowel.  Musculoskeletal: No chest wall abnormality. No acute or significant osseous findings.  Review of the MIP images confirms the above findings.  IMPRESSION: 1. Very limited study due to respiratory motion artifact. No large or central pulmonary artery embolus identified. 2. Known  pulmonary metastatic disease. 3. Aortic Atherosclerosis (ICD10-I70.0).   Electronically Signed   By: Anner Crete M.D.   On: 04/22/2020 16:43  Allergies:  Allergies  Allergen Reactions  . Hydrochlorothiazide Swelling  . Lisinopril Swelling    Angioedema of lips  . Tramadol     Current Medications: Current Outpatient Medications  Medication Sig Dispense Refill  . allopurinol (ZYLOPRIM) 100 MG tablet Take 100 mg by mouth daily.    Marland Kitchen amLODipine (NORVASC) 10 MG tablet Take 10 mg by mouth daily.    . ANUCORT-HC 25 MG  suppository Place 25 mg rectally 2 (two) times daily as needed.    . CABOMETYX 60 MG tablet Take 60 mg by mouth at bedtime.    . calcium citrate-vitamin D (CITRACAL+D) 315-200 MG-UNIT tablet Take 1 tablet by mouth 2 (two) times daily.    . cloNIDine (CATAPRES) 0.1 MG tablet Take 0.1 mg by mouth 2 (two) times daily.    . Denosumab (XGEVA [place]) Inject into the skin.    . hydrochlorothiazide (HYDRODIURIL) 25 MG tablet Take 25 mg by mouth daily.    Marland Kitchen HYDROcodone-acetaminophen (NORCO/VICODIN) 5-325 MG tablet Take 1-2 tablets by mouth. for pain    . hydrocortisone cream 1 % Apply 1 application topically 2 (two) times daily.    . INLYTA 5 MG tablet Take by mouth.    . lactulose (CHRONULAC) 10 GM/15ML solution Take 10 g by mouth at bedtime as needed for mild constipation.    . metoprolol tartrate (LOPRESSOR) 25 MG tablet Take 25 mg by mouth 2 (two) times daily.    . mirtazapine (REMERON SOL-TAB) 15 MG disintegrating tablet Take 15 mg by mouth at bedtime.    . mirtazapine (REMERON) 15 MG tablet Take 15 mg by mouth at bedtime.    . Multiple Vitamins-Minerals (MULTIVITAMIN WITH MINERALS) tablet Take 1 tablet by mouth daily.    Marland Kitchen NARCAN 4 MG/0.1ML LIQD nasal spray kit SMARTSIG:1 Both Nares Daily PRN    . olmesartan (BENICAR) 20 MG tablet Take 20 mg by mouth daily.    . ondansetron (ZOFRAN) 4 MG tablet Take 4 mg by mouth every 8 (eight) hours as needed for nausea or vomiting.    . Oxycodone HCl 10 MG TABS Take 1 tablet (10 mg total) by mouth every 4 (four) hours as needed (pain). 15 tablet 0  . prochlorperazine (COMPAZINE) 10 MG tablet Take 10 mg by mouth every 6 (six) hours as needed for nausea or vomiting.    . traMADol (ULTRAM) 50 MG tablet Take 50 mg by mouth every 6 (six) hours as needed.    . traZODone (DESYREL) 50 MG tablet Take 50 mg by mouth at bedtime.    . triamcinolone (KENALOG) 0.025 % cream Apply 1 application topically 2 (two) times daily.    . vitamin B-12 (CYANOCOBALAMIN) 100 MCG tablet Take  100 mcg by mouth daily.    . Zoledronic Acid (ZOMETA) 4 MG/100ML IVPB Inject 4 mg into the vein.     No current facility-administered medications for this visit.     ASSESSMENT & PLAN:   Assessment:   1. Stage III renal cell carcinoma status post radical left nephrectomy.  Due to the stage and renal vein invasion, we recommended adjuvant therapy with sunitinib, which he could not tolerate due to severe uncontrolled hypertension.  He was then switched to ipilimumab/nivolumab, but unfortunately had progressive disease.  He was taking oral cabozantinib, but had recurrent hypertension after just 2 weeks of therapy.  Cabozantinib was  held due to hypertension, then resumed once the hypertension was controlled.  Due to progressive vitiligo, the patient discontinue cabozantinib.  He was placed on axitinib 5 mg twice daily and has since stopped himself due to toxicities including bilateral lower extremity and right hand edema, shortness of breath, fatigue and hoarseness. He was then reduced to 3 mg twice daily and had recurrence of same toxicities. He has since stopped axitinib completely.  2. Anemia, which has improved, likely due to dehydration.  We will continue to monitor this. Hemoglobin stable today at 10.1 since last visit.  3. Vitamin-D deficiency, he continues vitamin-D 3 2000 international units daily.  4. Hypertension, which has been very difficult to control.  He remains on multiple medications.  His blood pressure prior to starting cabozantinib was 126/81. He states his PCP stopped all but one blood pressure medicine; however, he can't remember which one and only takes it occasionally  5. Recurrent renal cell carcinoma with PET positive pulmonary nodules.  CT imaging July revealed new and enlarging pulmonary nodules.  He was receiving palliative cabozantinib, which is now discontinued due to dermatologic toxicities.  He was started on axitinib, but has since discontinued that due to  toxicities.  6. Mildly abnormal kidney function, in part due to dehydration, improved.    7. Destructive metastatic lesion in the right humeral head measuring 19 mm.  He received palliative radiation with better control of his pain.  He will continue Tylenol as needed.  We will proceed with denosumab today. He continues to have pain. He refuses physical therapy.  8. Episode of hemoptysis in late June.  CTA imaging revealed pulmonary metastases with progression from March 2021.  Apical segment right lower lobe nodule now measures 17 mm, previously 10 mm.  Mild ground-glass opacity around a right lower lobe nodule measuring 22 mm, previously 14 mm.  CT imaging from July also confirmed worsening disease.  He has not had further episodes of hemoptysis.  9. Splenic lesion measuring 5.6 x 3.0 cm, which is suspicious for metastatic disease.   10. Gout, I will place him on allopurinol 100 mg daily.    Plan: He will remain off of axitinib. As his side effects have almost completely resolved, he is interested in discussing additional treatment options. We will see him back in the clinic after the Christmas holidays with repeat CBC, CMP and evaluation. Both he and his significant other verbalize understanding of and agreement to the plans discussed today.   I provided 20 minutes of face-to-face time during this this encounter and > 50% was spent counseling as documented under my assessment and plan.    Melodye Ped, NP [facility] [address] [place] [address] Dept: [phone] Dept Fax: [phone]

## 2020-05-06 ENCOUNTER — Inpatient Hospital Stay: Payer: BC Managed Care – PPO | Attending: Oncology

## 2020-05-06 ENCOUNTER — Telehealth: Payer: Self-pay | Admitting: Oncology

## 2020-05-06 ENCOUNTER — Inpatient Hospital Stay (INDEPENDENT_AMBULATORY_CARE_PROVIDER_SITE_OTHER): Payer: BC Managed Care – PPO | Admitting: Hematology and Oncology

## 2020-05-06 ENCOUNTER — Other Ambulatory Visit: Payer: Self-pay | Admitting: Hematology and Oncology

## 2020-05-06 ENCOUNTER — Other Ambulatory Visit: Payer: Self-pay

## 2020-05-06 ENCOUNTER — Inpatient Hospital Stay: Payer: BC Managed Care – PPO

## 2020-05-06 DIAGNOSIS — C642 Malignant neoplasm of left kidney, except renal pelvis: Secondary | ICD-10-CM

## 2020-05-06 DIAGNOSIS — Z0001 Encounter for general adult medical examination with abnormal findings: Secondary | ICD-10-CM | POA: Diagnosis not present

## 2020-05-06 DIAGNOSIS — C7800 Secondary malignant neoplasm of unspecified lung: Secondary | ICD-10-CM

## 2020-05-06 DIAGNOSIS — C7951 Secondary malignant neoplasm of bone: Secondary | ICD-10-CM | POA: Diagnosis not present

## 2020-05-06 DIAGNOSIS — E559 Vitamin D deficiency, unspecified: Secondary | ICD-10-CM | POA: Insufficient documentation

## 2020-05-06 DIAGNOSIS — I10 Essential (primary) hypertension: Secondary | ICD-10-CM | POA: Insufficient documentation

## 2020-05-06 DIAGNOSIS — G4700 Insomnia, unspecified: Secondary | ICD-10-CM | POA: Insufficient documentation

## 2020-05-06 DIAGNOSIS — Z79899 Other long term (current) drug therapy: Secondary | ICD-10-CM | POA: Insufficient documentation

## 2020-05-06 DIAGNOSIS — G893 Neoplasm related pain (acute) (chronic): Secondary | ICD-10-CM | POA: Insufficient documentation

## 2020-05-06 DIAGNOSIS — M109 Gout, unspecified: Secondary | ICD-10-CM | POA: Insufficient documentation

## 2020-05-06 DIAGNOSIS — Z905 Acquired absence of kidney: Secondary | ICD-10-CM | POA: Insufficient documentation

## 2020-05-06 LAB — CBC AND DIFFERENTIAL
HCT: 32 — AB (ref 41–53)
Hemoglobin: 10.1 — AB (ref 13.5–17.5)
Neutrophils Absolute: 2.76
Platelets: 273 (ref 150–399)
WBC: 4.6

## 2020-05-06 LAB — HEPATIC FUNCTION PANEL
ALT: 16 (ref 10–40)
AST: 27 (ref 14–40)
Alkaline Phosphatase: 101 (ref 25–125)
Bilirubin, Total: 0.5

## 2020-05-06 LAB — COMPREHENSIVE METABOLIC PANEL
Albumin: 4 (ref 3.5–5.0)
Calcium: 9 (ref 8.7–10.7)

## 2020-05-06 LAB — CBC
MCV: 80 (ref 76–111)
RBC: 3.93 (ref 3.87–5.11)

## 2020-05-06 LAB — BASIC METABOLIC PANEL
BUN: 17 (ref 4–21)
CO2: 26 — AB (ref 13–22)
Chloride: 106 (ref 99–108)
Creatinine: 1.2 (ref 0.6–1.3)
Glucose: 107
Potassium: 4.2 (ref 3.4–5.3)
Sodium: 139 (ref 137–147)

## 2020-05-06 NOTE — Telephone Encounter (Signed)
Per 12/7 LOS, scheduled patient for 12/27 Appt's - Next Xgeva Injection (06/06/2020) . Patient scheduled and give Appt Summary

## 2020-05-09 ENCOUNTER — Inpatient Hospital Stay: Payer: BC Managed Care – PPO

## 2020-05-09 ENCOUNTER — Other Ambulatory Visit: Payer: Self-pay

## 2020-05-09 VITALS — BP 147/99 | HR 89 | Temp 98.0°F | Resp 18 | Ht 70.0 in | Wt 237.2 lb

## 2020-05-09 DIAGNOSIS — C7951 Secondary malignant neoplasm of bone: Secondary | ICD-10-CM | POA: Diagnosis not present

## 2020-05-09 DIAGNOSIS — C7800 Secondary malignant neoplasm of unspecified lung: Secondary | ICD-10-CM | POA: Diagnosis not present

## 2020-05-09 DIAGNOSIS — G4700 Insomnia, unspecified: Secondary | ICD-10-CM | POA: Diagnosis not present

## 2020-05-09 DIAGNOSIS — M109 Gout, unspecified: Secondary | ICD-10-CM | POA: Diagnosis not present

## 2020-05-09 DIAGNOSIS — Z79899 Other long term (current) drug therapy: Secondary | ICD-10-CM | POA: Diagnosis not present

## 2020-05-09 DIAGNOSIS — C642 Malignant neoplasm of left kidney, except renal pelvis: Secondary | ICD-10-CM | POA: Diagnosis not present

## 2020-05-09 DIAGNOSIS — I10 Essential (primary) hypertension: Secondary | ICD-10-CM | POA: Diagnosis not present

## 2020-05-09 DIAGNOSIS — G893 Neoplasm related pain (acute) (chronic): Secondary | ICD-10-CM | POA: Diagnosis not present

## 2020-05-09 DIAGNOSIS — E559 Vitamin D deficiency, unspecified: Secondary | ICD-10-CM | POA: Diagnosis not present

## 2020-05-09 DIAGNOSIS — Z905 Acquired absence of kidney: Secondary | ICD-10-CM | POA: Diagnosis not present

## 2020-05-09 MED ORDER — METHYLPREDNISOLONE SODIUM SUCC 125 MG IJ SOLR: 125.0000 mg | Freq: Once | INTRAMUSCULAR | Status: DC | PRN

## 2020-05-09 MED ORDER — ALBUTEROL SULFATE (2.5 MG/3ML) 0.083% IN NEBU
2.5000 mg | INHALATION_SOLUTION | Freq: Once | RESPIRATORY_TRACT | Status: DC | PRN
  Filled 2020-05-09: qty 3

## 2020-05-09 MED ORDER — DENOSUMAB 120 MG/1.7ML ~~LOC~~ SOLN
SUBCUTANEOUS | Status: AC
  Filled 2020-05-09: qty 1.7

## 2020-05-09 MED ORDER — EPINEPHRINE 1 MG/10ML IJ SOSY
0.2500 mg | PREFILLED_SYRINGE | Freq: Once | INTRAMUSCULAR | Status: DC | PRN
  Filled 2020-05-09: qty 10

## 2020-05-09 MED ORDER — SODIUM CHLORIDE 0.9 % IV SOLN
Freq: Once | INTRAVENOUS | Status: DC | PRN
  Filled 2020-05-09: qty 250

## 2020-05-09 MED ORDER — DENOSUMAB 120 MG/1.7ML ~~LOC~~ SOLN
120.0000 mg | Freq: Once | SUBCUTANEOUS | Status: AC
Start: 2020-05-09 — End: 2020-05-09
  Administered 2020-05-09: 120 mg via SUBCUTANEOUS

## 2020-05-09 MED ORDER — DIPHENHYDRAMINE HCL 50 MG/ML IJ SOLN: 50.0000 mg | Freq: Once | INTRAMUSCULAR | Status: DC | PRN

## 2020-05-09 MED ORDER — DIPHENHYDRAMINE HCL 50 MG/ML IJ SOLN: 25.0000 mg | Freq: Once | INTRAMUSCULAR | Status: DC | PRN

## 2020-05-09 NOTE — Patient Instructions (Signed)
Denosumab injection What is this medicine? DENOSUMAB (den oh sue mab) slows bone breakdown. Prolia is used to treat osteoporosis in women after menopause and in men, and in people who are taking corticosteroids for 6 months or more. Xgeva is used to treat a high calcium level due to cancer and to prevent bone fractures and other bone problems caused by multiple myeloma or cancer bone metastases. Xgeva is also used to treat giant cell tumor of the bone. This medicine may be used for other purposes; ask your health care provider or pharmacist if you have questions. COMMON BRAND NAME(S): Prolia, XGEVA What should I tell my health care provider before I take this medicine? They need to know if you have any of these conditions:  dental disease  having surgery or tooth extraction  infection  kidney disease  low levels of calcium or Vitamin D in the blood  malnutrition  on hemodialysis  skin conditions or sensitivity  thyroid or parathyroid disease  an unusual reaction to denosumab, other medicines, foods, dyes, or preservatives  pregnant or trying to get pregnant  breast-feeding How should I use this medicine? This medicine is for injection under the skin. It is given by a health care professional in a hospital or clinic setting. A special MedGuide will be given to you before each treatment. Be sure to read this information carefully each time. For Prolia, talk to your pediatrician regarding the use of this medicine in children. Special care may be needed. For Xgeva, talk to your pediatrician regarding the use of this medicine in children. While this drug may be prescribed for children as young as 13 years for selected conditions, precautions do apply. Overdosage: If you think you have taken too much of this medicine contact a poison control center or emergency room at once. NOTE: This medicine is only for you. Do not share this medicine with others. What if I miss a dose? It is  important not to miss your dose. Call your doctor or health care professional if you are unable to keep an appointment. What may interact with this medicine? Do not take this medicine with any of the following medications:  other medicines containing denosumab This medicine may also interact with the following medications:  medicines that lower your chance of fighting infection  steroid medicines like prednisone or cortisone This list may not describe all possible interactions. Give your health care provider a list of all the medicines, herbs, non-prescription drugs, or dietary supplements you use. Also tell them if you smoke, drink alcohol, or use illegal drugs. Some items may interact with your medicine. What should I watch for while using this medicine? Visit your doctor or health care professional for regular checks on your progress. Your doctor or health care professional may order blood tests and other tests to see how you are doing. Call your doctor or health care professional for advice if you get a fever, chills or sore throat, or other symptoms of a cold or flu. Do not treat yourself. This drug may decrease your body's ability to fight infection. Try to avoid being around people who are sick. You should make sure you get enough calcium and vitamin D while you are taking this medicine, unless your doctor tells you not to. Discuss the foods you eat and the vitamins you take with your health care professional. See your dentist regularly. Brush and floss your teeth as directed. Before you have any dental work done, tell your dentist you are   receiving this medicine. Do not become pregnant while taking this medicine or for 5 months after stopping it. Talk with your doctor or health care professional about your birth control options while taking this medicine. Women should inform their doctor if they wish to become pregnant or think they might be pregnant. There is a potential for serious side  effects to an unborn child. Talk to your health care professional or pharmacist for more information. What side effects may I notice from receiving this medicine? Side effects that you should report to your doctor or health care professional as soon as possible:  allergic reactions like skin rash, itching or hives, swelling of the face, lips, or tongue  bone pain  breathing problems  dizziness  jaw pain, especially after dental work  redness, blistering, peeling of the skin  signs and symptoms of infection like fever or chills; cough; sore throat; pain or trouble passing urine  signs of low calcium like fast heartbeat, muscle cramps or muscle pain; pain, tingling, numbness in the hands or feet; seizures  unusual bleeding or bruising  unusually weak or tired Side effects that usually do not require medical attention (report to your doctor or health care professional if they continue or are bothersome):  constipation  diarrhea  headache  joint pain  loss of appetite  muscle pain  runny nose  tiredness  upset stomach This list may not describe all possible side effects. Call your doctor for medical advice about side effects. You may report side effects to FDA at 1-800-FDA-1088. Where should I keep my medicine? This medicine is only given in a clinic, doctor's office, or other health care setting and will not be stored at home. NOTE: This sheet is a summary. It may not cover all possible information. If you have questions about this medicine, talk to your doctor, pharmacist, or health care provider.  2020 [employer] Standard (2017-09-23 16:10:44)

## 2020-05-09 NOTE — Progress Notes (Signed)
PT STABLE AT TIME OF DISCHARGE 

## 2020-05-14 ENCOUNTER — Telehealth: Payer: Self-pay | Admitting: Oncology

## 2020-05-14 DIAGNOSIS — I10 Essential (primary) hypertension: Secondary | ICD-10-CM | POA: Diagnosis not present

## 2020-05-14 DIAGNOSIS — Z6833 Body mass index (BMI) 33.0-33.9, adult: Secondary | ICD-10-CM | POA: Diagnosis not present

## 2020-05-14 NOTE — Telephone Encounter (Signed)
Per 12/14 sched note.appt sched left vm

## 2020-05-20 ENCOUNTER — Other Ambulatory Visit: Payer: Self-pay

## 2020-05-26 ENCOUNTER — Ambulatory Visit: Payer: BC Managed Care – PPO

## 2020-05-26 ENCOUNTER — Other Ambulatory Visit: Payer: Self-pay

## 2020-05-26 ENCOUNTER — Inpatient Hospital Stay: Payer: BC Managed Care – PPO

## 2020-05-26 ENCOUNTER — Inpatient Hospital Stay (INDEPENDENT_AMBULATORY_CARE_PROVIDER_SITE_OTHER): Payer: BC Managed Care – PPO | Admitting: Oncology

## 2020-05-26 ENCOUNTER — Other Ambulatory Visit: Payer: Self-pay | Admitting: Hematology and Oncology

## 2020-05-26 ENCOUNTER — Encounter: Payer: Self-pay | Admitting: Oncology

## 2020-05-26 DIAGNOSIS — C7802 Secondary malignant neoplasm of left lung: Secondary | ICD-10-CM

## 2020-05-26 DIAGNOSIS — C7801 Secondary malignant neoplasm of right lung: Secondary | ICD-10-CM

## 2020-05-26 DIAGNOSIS — Z0001 Encounter for general adult medical examination with abnormal findings: Secondary | ICD-10-CM | POA: Diagnosis not present

## 2020-05-26 DIAGNOSIS — C7951 Secondary malignant neoplasm of bone: Secondary | ICD-10-CM

## 2020-05-26 LAB — BASIC METABOLIC PANEL
BUN: 17 (ref 4–21)
CO2: 18 (ref 13–22)
Chloride: 100 (ref 99–108)
Creatinine: 1.2 (ref 0.6–1.3)
Glucose: 97
Potassium: 4.3 (ref 3.4–5.3)
Sodium: 138 (ref 137–147)

## 2020-05-26 LAB — CBC AND DIFFERENTIAL
HCT: 31 — AB (ref 41–53)
Hemoglobin: 9.9 — AB (ref 13.5–17.5)
Neutrophils Absolute: 2.55
Platelets: 248 (ref 150–399)
WBC: 4.4

## 2020-05-26 LAB — HEPATIC FUNCTION PANEL
ALT: 11 (ref 10–40)
AST: 21 (ref 14–40)
Alkaline Phosphatase: 71 (ref 25–125)
Bilirubin, Total: 0.4

## 2020-05-26 LAB — CBC: RBC: 3.88 (ref 3.87–5.11)

## 2020-05-26 LAB — COMPREHENSIVE METABOLIC PANEL
Albumin: 4.3 (ref 3.5–5.0)
Calcium: 9.4 (ref 8.7–10.7)

## 2020-05-26 MED ORDER — HYDROCODONE-ACETAMINOPHEN 10-325 MG PO TABS: 1.0000 | ORAL_TABLET | ORAL | 0 refills | Status: DC | PRN

## 2020-05-26 NOTE — Progress Notes (Signed)
[name]  [address] [name],  [name]  [address] [phone]  Clinic Day:  05/26/2020  Referring physician: Lind Guest, NP   This document serves as a record of services personally performed by [name], MD. It was created on their behalf by [name],[name], a trained medical scribe. The creation of this record is based on the scribe's personal observations and the provider's statements to them.   CHIEF COMPLAINT:  CC: Stage III renal cell carcinoma  Current Treatment:  Monthly denosumab; We will plan to start him on Afinitor 10 mg daily   HISTORY OF PRESENT ILLNESS:  Steven Peck is a 54 y.o. 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  We began seeing him in May 2020 when he was referred by [name], NP, for a left renal mass and iron deficiency anemia.  He had transient hematuria, which later resolved, and weight loss.  In early May 2020, his hemoglobin was 11.3 with an MCV of 75. PSA was normal.  He had hypercalcemia with a calcium of 12.8.  Repeat labs a few days later revealed persistent hypercalcemia with a calcium of 13.  PTH was low at 6. Vitamin-D was low at 17.1.  He was started on vitamin D supplementation.  His hemoglobin continued to decrease to 10.8.  Serum protein electrophoresis was negative for a monoclonal spike but his gamma globulins were elevated at 4.4.  A random urine protein electrophoresis was also negative for monoclonal spike.  He was given hydrocodone/APAP 7.5/325 every 6 hours as needed for pain.  CT abdomen and pelvis revealed marked generalized swelling of the left kidney with hyperdensity throughout the renal collecting system, renal pelvis, ureter and posterior bladder consistent with gross hemorrhage with a 6-7 cm infiltrating mass in the upper pole of the left kidney.  There was no evidence of venous invasion.  There were abnormal nodes in the retroperitoneum medial  to the left kidney, up to 1.5 cm in diameter.  Repeat calcium in our office at the end of May was down to 11.6.  When we saw him in early June, his hemoglobin has dropped from 10 to 9.5.  B12 was low normal and he was placed on oral vitamin B12 1000 mcg daily.  His calcium had increased to 13, so he was given IV fluids and IV zoledronic acid 4 mg.  He reported persistent pain in his left flank and left upper inner thigh, which he continue to rate 10/10, despite the hydrocodone/APAP.  We switched him to oxycodone 10 mg every 4 hours as needed.  PET scan revealed malignancy of the kidney, but no definite evidence of metastasis.  There were two right pulmonary nodules measuring 3 mm.  MRI brain did not reveal any intracranial metastasis.  He underwent biopsy of the left kidney mass in June which revealed renal cell carcinoma.  He underwent left radical nephrectomy in August.  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  11 lymph nodes were negative for metastasis.  Tumor extended into the renal vein, but margins were clear.  Due to his high risk of recurrence, he was was recommended for adjuvant oral chemotherapy with sunitinib 50 mg daily for 2 weeks on and 1 week off for total of 1 year.  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  He was evaluated in the emergency department and placed  on clonidine 0.2 mg 3 times daily.  The hypertension was uncontrolled with this, and other medications were tried.  Sunitinib was therefore discontinued after only 2 weeks of therapy.  He saw Dr. [name] for routine follow-up in February of 2021.  At that time, CT abdomen and pelvis revealed basilar pulmonary nodules, which had increased in size and number, and were felt to be highly suspicious for metastatic disease.  There was a persistent splenic lesion, once again felt to be benign.  He was referred back to [place].  He had worsening anemia and  iron panel and ferritin were equivocal, but a soluble transferrin was normal, so inconsistent with iron deficiency.  PET scan revealed several of the pulmonary nodules to be hypermetabolic, consistent with metastatic disease.  There was no evidence of recurrent or metastatic disease within the abdomen and pelvis.  We recommended palliative immunotherapy with ipilimumab/nivolumab for 4 cycles to be followed by maintenance nivolumab.  He experienced severe pain of his right shoulder and down his right arm with associated swelling.  He is unable to fully lift his arm, and his right neck is also swollen.  Ultrasound was negative for thrombosis.  His Port-A-Cath was then removed.  He was seen in clinic at the end of the day on July 1st., after he had been seen in the emergency department earlier in the day because of mild hemoptysis.  CTA chest with contrast revealed pulmonary metastases with mild progression from March 2021.  The apical segment right lower lobe nodule was 17 mm, previously 10 mm.  Mild ground-glass opacity around a right lower lobe nodule measuring 22 mm, previously 14 mm.  It was unclear if this represents pseudoprogression or true progression.  He was still complaining of severe right shoulder pain, so x-rays were ordered, and revealed a large lucent lesion in the right humeral head with cortical destruction, as well as moderate degenerative changes of the acromioclavicular and glenohumeral joints.  CT chest, abdomen and pelvis from July 2nd revealed a destructive metastatic lesion of the right proximal humerus measuring 19 mm.  Numerous pulmonary nodules were again observed, similar to the prior exam on June 30th, but new and enlarging nodules overall since March 2021.  Splenic lesion measuring 5.6 x 3.0 cm, previously 4.7 x 5.0 cm, is indeterminate, but this is suspicious for metastasis despite lack of FDG uptake, as it has enlarged since previous imaging.  Nivolumab was discontinued.  He saw Dr.  [name] and received palliative radiation to the right humeral head lesion, completed on July 20th.    Due to his progressive disease he was switched to palliative oral chemotherapy with cabozantinib 60 mg daily which he started on July 21st.  This was placed on hold due to uncontrolled hypertension.  He also had hypopigmentation of the perioral area, as well as scattered areas on his back.  He discontinued tramadol as this was causing severe headaches.  He uses hydrocodone/APAP as needed for pain.  He resumed cabozantinib on August 12th, once his blood pressure was controlled.  He had mild hyperkalemia at that time, so we increased his HCTZ to 2 tabs daily.  He has had persistent right shoulder pain, for which he uses Tylenol, as he did not tolerate any opioids.  We added mirtazapine 15 mg at bedtime for insomnia in September.  He discontinued cabozantinib in September weeks ago due to progressive vitiligo.  We recommended alternative therapy with axitinib 5 mg twice daily.  He continues denosumab every 4 weeks.  Repeat CT imaging was done prior to starting axitinib in revealed a decrease in his pulmonary nodules, without evidence of new metastatic disease in the lungs or abdomen and pelvis.  He was found to have an elevated uric acid and findings consistent with gouty arthritis, which he claims he has dealt with in the past.  He had symptoms of shortness of breath, fatigue and hoarseness so he stopped his axitinib on his own.  He also reports one episode of coughing up bloody sputum.  CT imaging from November 9th revealed mild interval progression of bilateral pulmonary metastases, and slight interval increase in size of bilateral external iliac lymph nodes although they remain within normal limits for size.  No evidence for new or suspicious soft tissue in the nephrectomy bed.  He was restarted on axitinib at 3 mg twice daily and experienced the same side effects including shortness of breath and edema. A CT of  the chest was obtained and was read as a very limited study due to respiratory motion artifact. No large or central pulmonary artery embolus identified. Known pulmonary metastatic disease and aortic atherosclerosis. He stopped medication at that time.   INTERVAL HISTORY:  Xsavier is here for routine follow up and continues to report constant pain of the right shoulder, which he rates as a 10/10 today.  This is especially bad at night and he has been unable to sleep.  He has hydrocodone 5/325 and has been trying to average 2-3 daily, but lately has required 6.  I will send in a new prescription for 10/325 to better control his pain and advised that he take 10 mg at bedtime to help him sleep through the night.  Otherwise, he denies complaints.  He continues to work.  He has been off of axitinib for nearly 2 months now.  Blood counts and chemistries are unremarkable except for a hemoglobin of 9.9, stable to mildly worse.  His  appetite is good, and he has lost 5 pounds since his last visit.  He denies fever, chills or other signs of infection.  He denies nausea, vomiting, bowel issues, or abdominal pain.  He denies sore throat, cough, dyspnea, or chest pain.   REVIEW OF SYSTEMS:  Review of Systems  Constitutional: Negative.  Negative for fatigue.  HENT:  Negative.   Eyes: Negative.   Respiratory: Negative.   Cardiovascular: Negative.   Gastrointestinal: Negative.   Endocrine: Negative.   Genitourinary: Negative.    Musculoskeletal: Negative.        Constant pain of the right shoulder  Skin: Negative.   Neurological: Negative.   Hematological: Negative.   Psychiatric/Behavioral: Positive for sleep disturbance (due to pain).  All other systems reviewed and are negative.    VITALS:  Blood pressure (!) 159/91, pulse 88, temperature 97.9 F (36.6 C), temperature source Oral, resp. rate 18, height _0  (1.778 m), weight 232 lb 4.8 oz (105.4 kg), SpO2 99 %.  Wt Readings from Last 3 Encounters:   05/26/20 232 lb 4.8 oz (105.4 kg)  05/09/20 237 lb 4 oz (107.6 kg)  05/06/20 234 lb 12.8 oz (106.5 kg)    Body mass index is 33.33 kg/m.  Performance status (ECOG): 1 - Symptomatic but completely ambulatory  PHYSICAL EXAM:  Physical Exam Constitutional:      General: He is not in acute distress.    Appearance: Normal appearance. He is normal weight.  HENT:     Head: Normocephalic and atraumatic.  Eyes:     General:  No scleral icterus.    Extraocular Movements: Extraocular movements intact.     Conjunctiva/sclera: Conjunctivae normal.     Pupils: Pupils are equal, round, and reactive to light.  Cardiovascular:     Rate and Rhythm: Normal rate and regular rhythm.     Pulses: Normal pulses.     Heart sounds: Normal heart sounds. No murmur heard. No friction rub. No gallop.   Pulmonary:     Effort: Pulmonary effort is normal. No respiratory distress.     Breath sounds: Normal breath sounds.  Abdominal:     General: Bowel sounds are normal. There is no distension.     Palpations: Abdomen is soft. There is no hepatomegaly, splenomegaly or mass.     Tenderness: There is no abdominal tenderness.  Musculoskeletal:        General: Normal range of motion.     Cervical back: Normal range of motion and neck supple.     Right lower leg: No edema.     Left lower leg: No edema.  Lymphadenopathy:     Cervical: No cervical adenopathy.  Skin:    General: Skin is warm and dry.  Neurological:     General: No focal deficit present.     Mental Status: He is alert and oriented to person, place, and time. Mental status is at baseline.  Psychiatric:        Mood and Affect: Mood normal.        Behavior: Behavior normal.        Thought Content: Thought content normal.        Judgment: Judgment normal.     LABS:   CBC Latest Ref Rng & Units 05/26/2020 05/06/2020 03/19/2020  WBC - 4.4 4.6 6.6  Hemoglobin 13.5 - 17.5 9.9(A) 10.1(A) 10.6(A)  Hematocrit 41 - 53 31(A) 32(A) 32(A)  Platelets  150 - 399 248 273 377   CMP Latest Ref Rng & Units 05/26/2020 05/06/2020 03/19/2020  Glucose 70 - 99 mg/dL - - -  BUN 4 - _0 31(A)  Creatinine 0.6 - 1.3 1.2 1.2 1.5(A)  Sodium 137 - 147 138 139 136(A)  Potassium 3.4 - 5.3 4.3 4.2 4.3  Chloride 99 - 108 100 106 103  CO2 13 - 22 18 26(A) 21  Calcium 8.7 - 10.7 9.4 9.0 9.4  Alkaline Phos 25 - 125 71 101 97  AST 14 - 40 _1 ALT 10 - 40 _2 STUDIES:   No current studies  Allergies:  Allergies  Allergen Reactions  . Hydrochlorothiazide Swelling  . Lisinopril Swelling    Angioedema of lips  . Tramadol     Current Medications: Current Outpatient Medications  Medication Sig Dispense Refill  . allopurinol (ZYLOPRIM) 100 MG tablet Take 100 mg by mouth daily.    . ANUCORT-HC 25 MG suppository Place 25 mg rectally 2 (two) times daily as needed.    . calcium citrate-vitamin D (CITRACAL+D) 315-200 MG-UNIT tablet Take 1 tablet by mouth 2 (two) times daily.    . Denosumab (XGEVA [place]) Inject into the skin.    Marland Kitchen HYDROcodone-acetaminophen (NORCO/VICODIN) 5-325 MG tablet Take 1-2 tablets by mouth. for pain    . hydrocortisone cream 1 % Apply 1 application topically 2 (two) times daily.    Marland Kitchen lactulose (CHRONULAC) 10 GM/15ML solution Take 10 g by mouth at bedtime as needed for mild constipation.    . Multiple Vitamins-Minerals (MULTIVITAMIN WITH MINERALS) tablet Take 1  tablet by mouth daily.    Marland Kitchen NARCAN 4 MG/0.1ML LIQD nasal spray kit SMARTSIG:1 Both Nares Daily PRN    . ondansetron (ZOFRAN) 4 MG tablet Take 4 mg by mouth every 8 (eight) hours as needed for nausea or vomiting.    . Oxycodone HCl 10 MG TABS Take 1 tablet (10 mg total) by mouth every 4 (four) hours as needed (pain). 15 tablet 0  . prochlorperazine (COMPAZINE) 10 MG tablet Take 10 mg by mouth every 6 (six) hours as needed for nausea or vomiting.    . traMADol (ULTRAM) 50 MG tablet Take 50 mg by mouth every 6 (six) hours as needed.    . triamcinolone (KENALOG)  0.025 % cream Apply 1 application topically 2 (two) times daily.    . vitamin B-12 (CYANOCOBALAMIN) 100 MCG tablet Take 100 mcg by mouth daily.     No current facility-administered medications for this visit.     ASSESSMENT & PLAN:   Assessment:   1. Stage III renal cell carcinoma status post radical left nephrectomy.  Due to the stage and renal vein invasion, we recommended adjuvant therapy with sunitinib, which he could not tolerate due to severe uncontrolled hypertension.  He was then switched to ipilimumab/nivolumab, but unfortunately had progressive disease.  He was taking oral cabozantinib, but had recurrent hypertension after just 2 weeks of therapy.  Cabozantinib was held due to hypertension, then resumed once the hypertension was controlled.  Due to progressive vitiligo, the patient discontinue cabozantinib.  He was placed on axitinib 5 mg twice daily and has since stopped himself due to toxicities including bilateral lower extremity and right hand edema, shortness of breath, fatigue and hoarseness. He was then reduced to 3 mg twice daily and had recurrence of same toxicities. He has since stopped axitinib completely.  2. Anemia, which has improved, likely due to dehydration.  We will continue to monitor this. Hemoglobin is fairly stable today at 9.9.  3. Vitamin-D deficiency, he continues vitamin-D 3 2000 international units daily.  4. Hypertension, which has been very difficult to control.  He remains on multiple medications.  His blood pressure prior to starting cabozantinib was 126/81. He states his PCP stopped all but one blood pressure medicine; however, he can't remember which one and only takes it occasionally  5. Recurrent renal cell carcinoma with PET positive pulmonary nodules.  CT imaging July revealed new and enlarging pulmonary nodules.  He was receiving palliative cabozantinib, which is now discontinued due to dermatologic toxicities.  He was started on axitinib, but has  since discontinued that due to toxicities.  6. Mildly abnormal kidney function, in part due to dehydration, improved.    7. Destructive metastatic lesion in the right humeral head measuring 19 mm.  He received palliative radiation with better control of his pain.  He continues to have severe pain.   8. Episode of hemoptysis in late June.  CTA imaging revealed pulmonary metastases with progression from March 2021.  Apical segment right lower lobe nodule now measures 17 mm, previously 10 mm.  Mild ground-glass opacity around a right lower lobe nodule measuring 22 mm, previously 14 mm.  CT imaging from July also confirmed worsening disease.  He has not had further episodes of hemoptysis.  9. Splenic lesion measuring 5.6 x 3.0 cm, which is suspicious for metastatic disease.   10. Gout, I will place him on allopurinol 100 mg daily.    Plan: He will remain off of axitinib, and we discussed alternative therapies  today.  As he has been unable to tolerate three forms of TKI therapy, I will try him on Afinitor 10 mg daily, an MTOR inhibitor.  We reviewed the potential toxicities today and he is in agreement.  Melissa will also plan to meet with his for chemo-education and to possibly start preemptive dexamethasone rinses.  As his pain is not well controlled I weill increase his hydrocodone to 10/325, and advised that he take 10 mg tablets at bedtime.  We will have him keep his appointment on January 6th with repeat CBC, CMP and evaluation prior to his next denosumab. Both he and his significant other verbalize understanding of and agreement to the plans discussed today.   I provided 20 minutes of face-to-face time during this this encounter and > 50% was spent counseling as documented under my assessment and plan.    Derwood Kaplan, MD [facility] AT [facility] [address] [place] [address] Dept: [phone] Dept Fax: [phone]   I,  Rita Ohara, am acting as scribe for Derwood Kaplan, MD  I have reviewed this report as typed by the medical scribe, and it is complete and accurate.

## 2020-05-28 NOTE — Progress Notes (Signed)
Sent patients Rx for Afinitor to [facility], per insurance

## 2020-06-02 ENCOUNTER — Encounter: Payer: Self-pay | Admitting: Oncology

## 2020-06-02 ENCOUNTER — Encounter: Payer: Self-pay | Admitting: Hematology and Oncology

## 2020-06-04 ENCOUNTER — Inpatient Hospital Stay (INDEPENDENT_AMBULATORY_CARE_PROVIDER_SITE_OTHER): Payer: BC Managed Care – PPO | Admitting: Hematology and Oncology

## 2020-06-04 ENCOUNTER — Other Ambulatory Visit: Payer: Self-pay

## 2020-06-04 ENCOUNTER — Inpatient Hospital Stay: Payer: BC Managed Care – PPO | Attending: Oncology

## 2020-06-04 VITALS — BP 152/84 | HR 96 | Temp 98.6°F | Resp 18 | Ht 70.0 in | Wt 232.4 lb

## 2020-06-04 DIAGNOSIS — K5900 Constipation, unspecified: Secondary | ICD-10-CM | POA: Diagnosis not present

## 2020-06-04 DIAGNOSIS — Z79899 Other long term (current) drug therapy: Secondary | ICD-10-CM | POA: Diagnosis not present

## 2020-06-04 DIAGNOSIS — G629 Polyneuropathy, unspecified: Secondary | ICD-10-CM | POA: Diagnosis not present

## 2020-06-04 DIAGNOSIS — R197 Diarrhea, unspecified: Secondary | ICD-10-CM | POA: Insufficient documentation

## 2020-06-04 DIAGNOSIS — R112 Nausea with vomiting, unspecified: Secondary | ICD-10-CM | POA: Insufficient documentation

## 2020-06-04 DIAGNOSIS — C642 Malignant neoplasm of left kidney, except renal pelvis: Secondary | ICD-10-CM | POA: Diagnosis not present

## 2020-06-04 DIAGNOSIS — Z0001 Encounter for general adult medical examination with abnormal findings: Secondary | ICD-10-CM | POA: Diagnosis not present

## 2020-06-04 DIAGNOSIS — R630 Anorexia: Secondary | ICD-10-CM | POA: Insufficient documentation

## 2020-06-04 LAB — CBC AND DIFFERENTIAL
HCT: 32 — AB (ref 41–53)
Hemoglobin: 10.1 — AB (ref 13.5–17.5)
Neutrophils Absolute: 2.54
Platelets: 263 (ref 150–399)
WBC: 4.7

## 2020-06-04 LAB — BASIC METABOLIC PANEL
BUN: 17 (ref 4–21)
CO2: 25 — AB (ref 13–22)
Chloride: 104 (ref 99–108)
Creatinine: 1.2 (ref 0.6–1.3)
Glucose: 90
Potassium: 4.3 (ref 3.4–5.3)
Sodium: 138 (ref 137–147)

## 2020-06-04 LAB — HEPATIC FUNCTION PANEL
ALT: 14 (ref 10–40)
AST: 20 (ref 14–40)
Alkaline Phosphatase: 79 (ref 25–125)
Bilirubin, Total: 0.4

## 2020-06-04 LAB — COMPREHENSIVE METABOLIC PANEL
Albumin: 4.4 (ref 3.5–5.0)
Calcium: 9.9 (ref 8.7–10.7)

## 2020-06-04 LAB — TSH: TSH: 2.449 u[IU]/mL (ref 0.350–4.500)

## 2020-06-04 LAB — CBC: RBC: 4.03 (ref 3.87–5.11)

## 2020-06-04 NOTE — Progress Notes (Signed)
The patient is a 55 year old male with history of metastatic renal cell carcinoma.  Patient presents to clinic today with his wife for chemotherapy education and palliative care consult.  We will start Afinitor daily once authorization has been obtained.   We will send in prescriptions for prochlorperazine and ondansetron.  The patient verbalizes understanding of and agreement to the plan as discussed today.  Provided general information including the following: 1.  Date of education: 06-04-2020 2.  Physician name: Dr. Gilman Buttner 3.  Diagnosis: Renal Cell Carcinoma 4.  Stage: Metastatic 5.  Palliative 6.  Chemotherapy plan including drugs and how often: Afinitor daily 7.  Start date: Pending authorization 8.  Other referrals: No other referrals at this time 9.  The patient is to call our office with any questions or concerns.  Our office number [phone], if after hours or on the weekend, call the same number and wait for the answering service.  There is always an oncologist on call 10.  Medications prescribed: Zofran and compazine 11.  The patient has verbalized understanding of the treatment plan and has no barriers to adherence or understanding.  Obtained signed consent from patient.  Discussed symptoms including 1.  Low blood counts including red blood cells, white blood cells and platelets. 2. Infection including to avoid large crowds, wash hands frequently, and stay away from people who were sick.  If fever develops of 100.4 or higher, call our office. 3.  Mucositis-given instructions on mouth rinse (baking soda and salt mixture).  Keep mouth clean.  Use soft bristle toothbrush.  If mouth sores develop, call our clinic. 4.  Nausea/vomiting-gave prescriptions for ondansetron 4 mg every 4 hours as needed for nausea, may take around the clock if persistent.  Compazine 10 mg every 6 hours, may take around the clock if persistent. 5.  Diarrhea-use over-the-counter Imodium.  Call clinic if not  controlled. 6.  Constipation-use senna, 1 to 2 tablets twice a day.  If no BM in 2 to 3 days call the clinic. 7.  Loss of appetite-try to eat small meals every 2-3 hours.  Call clinic if not eating. 8.  Taste changes-zinc 500 mg daily.  If becomes severe call clinic. 9.  Alcoholic beverages. 10.  Drink 2 to 3 quarts of water per day. 11.  Peripheral neuropathy-patient to call if numbness or tingling in hands or feet is persistent   Gave information on the supportive care team and how to contact them regarding services.  Discussed advanced directives.  The patient does not have their advanced directives but will look at the copy provided in their notebook and will call with any questions. [employer] Social worker Advanced directives  Answered questions to patient satisfaction.  Patient is to call with any further questions or concerns.  Time spent on this palliative care/chemotherapy education was 60 minutes with more than 50% spent discussing diagnosis, prognosis and symptom management.  The medication prescribed to the patient will be printed out from [email or web address] This will give the following information: Name of your medication Approved uses Dose and schedule Storage and handling Handling body fluids and waste Drug and food interactions Possible side effects and management Pregnancy, sexual activity, and contraception Obtaining medication

## 2020-06-05 ENCOUNTER — Inpatient Hospital Stay: Payer: BC Managed Care – PPO | Admitting: Hematology and Oncology

## 2020-06-05 ENCOUNTER — Ambulatory Visit: Payer: BC Managed Care – PPO | Admitting: Hematology and Oncology

## 2020-06-05 ENCOUNTER — Inpatient Hospital Stay: Payer: BC Managed Care – PPO

## 2020-06-05 ENCOUNTER — Other Ambulatory Visit: Payer: BC Managed Care – PPO

## 2020-06-06 ENCOUNTER — Inpatient Hospital Stay: Payer: BC Managed Care – PPO

## 2020-06-06 ENCOUNTER — Other Ambulatory Visit: Payer: Self-pay

## 2020-06-06 VITALS — BP 150/98 | HR 91 | Temp 98.2°F | Resp 18 | Ht 70.0 in | Wt 234.5 lb

## 2020-06-06 DIAGNOSIS — G629 Polyneuropathy, unspecified: Secondary | ICD-10-CM | POA: Diagnosis not present

## 2020-06-06 DIAGNOSIS — R630 Anorexia: Secondary | ICD-10-CM | POA: Diagnosis not present

## 2020-06-06 DIAGNOSIS — R112 Nausea with vomiting, unspecified: Secondary | ICD-10-CM | POA: Diagnosis not present

## 2020-06-06 DIAGNOSIS — Z79899 Other long term (current) drug therapy: Secondary | ICD-10-CM | POA: Diagnosis not present

## 2020-06-06 DIAGNOSIS — K5900 Constipation, unspecified: Secondary | ICD-10-CM | POA: Diagnosis not present

## 2020-06-06 DIAGNOSIS — R197 Diarrhea, unspecified: Secondary | ICD-10-CM | POA: Diagnosis not present

## 2020-06-06 DIAGNOSIS — C7951 Secondary malignant neoplasm of bone: Secondary | ICD-10-CM

## 2020-06-06 DIAGNOSIS — C642 Malignant neoplasm of left kidney, except renal pelvis: Secondary | ICD-10-CM | POA: Diagnosis not present

## 2020-06-06 MED ORDER — DENOSUMAB 120 MG/1.7ML ~~LOC~~ SOLN
120.0000 mg | Freq: Once | SUBCUTANEOUS | Status: AC
  Administered 2020-06-06: 120 mg via SUBCUTANEOUS

## 2020-06-06 MED ORDER — DENOSUMAB 120 MG/1.7ML ~~LOC~~ SOLN
SUBCUTANEOUS | Status: AC
  Filled 2020-06-06: qty 1.7

## 2020-06-06 NOTE — Patient Instructions (Signed)
[facility] Discharge Instructions for Patients Receiving Chemotherapy  Today you received the following chemotherapy agents Denosumab  To help prevent nausea and vomiting after your treatment, we encourage you to take your nausea medication.   If you develop nausea and vomiting that is not controlled by your nausea medication, call the clinic.   BELOW ARE SYMPTOMS THAT SHOULD BE REPORTED IMMEDIATELY:  *FEVER GREATER THAN 100.5 F  *CHILLS WITH OR WITHOUT FEVER  NAUSEA AND VOMITING THAT IS NOT CONTROLLED WITH YOUR NAUSEA MEDICATION  *UNUSUAL SHORTNESS OF BREATH  *UNUSUAL BRUISING OR BLEEDING  TENDERNESS IN MOUTH AND THROAT WITH OR WITHOUT PRESENCE OF ULCERS  *URINARY PROBLEMS  *BOWEL PROBLEMS  UNUSUAL RASH Items with * indicate a potential emergency and should be followed up as soon as possible.  Feel free to call the clinic should you have any questions or concerns at The clinic phone number is [phone].  Please show the CHEMO ALERT CARD at check-in to the Emergency Department and triage nurse.   

## 2020-06-09 NOTE — Progress Notes (Signed)
Patients Afinitor was approved through his [employer], spoke with [place]. Patient has a $600 co-pay, was able to get him co-pay assistance through [employer]. I gave this to [facility], medication is ready to ship. Called patient, had to leave a voicemail asking him to call me back.

## 2020-06-11 ENCOUNTER — Telehealth: Payer: Self-pay

## 2020-06-11 NOTE — Telephone Encounter (Signed)
I called pt to see if his Afinitor arrived today. Pt states he hasn't heard from anyone or received delivery. Evelena Peat was told it would be delivered today I'm pretty sure.  While on the phone, I asked pt how he was doing. He replied, "Not so good". When I asked why, he replied his shoulder pain is worse. He rates it 10/10 right now. He took hydrocodone 10/325 1.5hrs ago. He states he usually just takes tylenol during the day because he has to work. He takes the hydrocodone @ bedtime.  He said, "the hydrocodone just really gives me a high, rather than help the pain, and I dont like that feeling".  Pt most likely would have even called to mention his unrelieved pain.

## 2020-06-13 ENCOUNTER — Other Ambulatory Visit: Payer: Self-pay

## 2020-06-13 ENCOUNTER — Telehealth: Payer: Self-pay

## 2020-06-13 ENCOUNTER — Other Ambulatory Visit: Payer: Self-pay | Admitting: Hematology and Oncology

## 2020-06-13 DIAGNOSIS — C642 Malignant neoplasm of left kidney, except renal pelvis: Secondary | ICD-10-CM

## 2020-06-13 NOTE — Telephone Encounter (Signed)
Pt received his shipment of Afinitor yesterday, 06/12/2020. He plans to start the medication on Sat, 06/14/20. Reminded pt to take the medication @ the same time daily, & that it can be taken with or without food. If dose is missed, he can take as soon as remembered, as long as not 6 hrs before next scheduled dose. Pt aware to call us if temp over 100.4 day or night, as provider on call. Pt verbalized understanding.

## 2020-06-18 DIAGNOSIS — R051 Acute cough: Secondary | ICD-10-CM | POA: Diagnosis not present

## 2020-06-18 DIAGNOSIS — Z20828 Contact with and (suspected) exposure to other viral communicable diseases: Secondary | ICD-10-CM | POA: Diagnosis not present

## 2020-06-20 ENCOUNTER — Telehealth: Payer: Self-pay

## 2020-06-20 NOTE — Telephone Encounter (Addendum)
I spoke to pt @ 1640, notified him of Melissa,NP, reply. Confirmed next appt 07/01/2020.  Attempted call again @1505 , no answer.  ----- Message from Melodye Ped, NP sent at 06/20/2020  1:58 PM EST ----- Regarding: RE: Antibiotic, appt Well, since he is COVID, he doesn't need an antibiotic. Yes, we will need to move his appointment past his quarantine date.  ----- Message ----- From: Dairl Ponder, RN Sent: 06/20/2020   1:31 PM EST To: Melodye Ped, NP Subject: Antibiotic, appt                               Pt called to remind [place] that the pharmacy hadn't received the antibiotic that you were going to send in. He also tested positive for COVID on Wednesday. He says he feels fine, other than the cough he has had for a while. The cough is only occasionally productive-white phlegm. Afebrile. He mentioned that you were going to move his appt up sooner than scheduled Feb appt. But now that he has COVID, we cant bring him in any sooner. He uses the [facility] in [place]. Please advise.

## 2020-06-20 NOTE — Telephone Encounter (Signed)
I called Steven Peck to check on him since he started taking the Afinitor. Steven Peck has been taking the Afinitor @ 8p nightly, mostly without food. Denies N/V, skin rashes, itching, & fever. He has only missed 1 dose since starting 06/14/2020. He has had occasional episodes of diarrhea "but not bad enough to take anything" per Steven Peck. Steven Peck understands to call us with any temp of 100.4 or over.

## 2020-06-20 NOTE — Telephone Encounter (Addendum)
Attempted call to pt, to notify him of below. Pt to keep the 07/01/2020 appt's (not moving sooner, as pt has COVID).   ----- Message from Melodye Ped, NP sent at 06/20/2020  1:58 PM EST ----- Regarding: RE: Antibiotic, appt Well, since he is COVID, he doesn't need an antibiotic. Yes, we will need to move his appointment past his quarantine date.  ----- Message ----- From: Dairl Ponder, RN Sent: 06/20/2020   1:31 PM EST To: Melodye Ped, NP Subject: Antibiotic, appt                               Pt called to remind [place] that the pharmacy hadn't received the antibiotic that you were going to send in. He also tested positive for COVID on Wednesday. He says he feels fine, other than the cough he has had for a while. The cough is only occasionally productive-white phlegm. Afebrile. He mentioned that you were going to move his appt up sooner than scheduled Feb appt. But now that he has COVID, we cant bring him in any sooner. He uses the [facility] in [place]. Please advise.

## 2020-06-25 ENCOUNTER — Other Ambulatory Visit: Payer: Self-pay | Admitting: Hematology and Oncology

## 2020-06-25 ENCOUNTER — Telehealth: Payer: Self-pay

## 2020-06-25 NOTE — Telephone Encounter (Signed)
Pt called to report he haws developed a mouth sore. He has tried Orajel. He does not have any MMW. He uses the [facility]. [phone]

## 2020-06-30 ENCOUNTER — Encounter: Payer: Self-pay | Admitting: Oncology

## 2020-07-01 ENCOUNTER — Inpatient Hospital Stay: Payer: BC Managed Care – PPO

## 2020-07-01 ENCOUNTER — Other Ambulatory Visit: Payer: Self-pay | Admitting: Hematology and Oncology

## 2020-07-01 ENCOUNTER — Inpatient Hospital Stay: Payer: BC Managed Care – PPO | Attending: Oncology | Admitting: Hematology and Oncology

## 2020-07-01 VITALS — BP 147/89 | HR 104 | Temp 98.4°F | Resp 18 | Ht 70.0 in | Wt 224.8 lb

## 2020-07-01 DIAGNOSIS — C642 Malignant neoplasm of left kidney, except renal pelvis: Secondary | ICD-10-CM | POA: Diagnosis not present

## 2020-07-01 DIAGNOSIS — G4700 Insomnia, unspecified: Secondary | ICD-10-CM | POA: Insufficient documentation

## 2020-07-01 DIAGNOSIS — I10 Essential (primary) hypertension: Secondary | ICD-10-CM | POA: Insufficient documentation

## 2020-07-01 DIAGNOSIS — C7801 Secondary malignant neoplasm of right lung: Secondary | ICD-10-CM | POA: Insufficient documentation

## 2020-07-01 DIAGNOSIS — Z79899 Other long term (current) drug therapy: Secondary | ICD-10-CM | POA: Insufficient documentation

## 2020-07-01 DIAGNOSIS — C7951 Secondary malignant neoplasm of bone: Secondary | ICD-10-CM | POA: Insufficient documentation

## 2020-07-01 DIAGNOSIS — D509 Iron deficiency anemia, unspecified: Secondary | ICD-10-CM | POA: Insufficient documentation

## 2020-07-01 LAB — BASIC METABOLIC PANEL
BUN: 25 — AB (ref 4–21)
CO2: 28 — AB (ref 13–22)
Chloride: 103 (ref 99–108)
Creatinine: 1.2 (ref 0.6–1.3)
Glucose: 92
Potassium: 4.7 (ref 3.4–5.3)
Sodium: 137 (ref 137–147)

## 2020-07-01 LAB — CBC AND DIFFERENTIAL
HCT: 32 — AB (ref 41–53)
Hemoglobin: 9.8 — AB (ref 13.5–17.5)
Neutrophils Absolute: 2.44
Platelets: 213 (ref 150–399)
WBC: 4.6

## 2020-07-01 LAB — CBC: RBC: 4.14 (ref 3.87–5.11)

## 2020-07-01 LAB — HEPATIC FUNCTION PANEL
ALT: 19 (ref 10–40)
AST: 26 (ref 14–40)
Alkaline Phosphatase: 92 (ref 25–125)
Bilirubin, Total: 0.4

## 2020-07-01 LAB — COMPREHENSIVE METABOLIC PANEL
Albumin: 4.3 (ref 3.5–5.0)
Calcium: 9.8 (ref 8.7–10.7)

## 2020-07-01 MED ORDER — MORPHINE SULFATE ER 15 MG PO TBCR
15.0000 mg | EXTENDED_RELEASE_TABLET | Freq: Two times a day (BID) | ORAL | 0 refills | Status: DC
Start: 2020-07-01 — End: 2020-07-15

## 2020-07-01 NOTE — Progress Notes (Signed)
[facility]  [address] [place],  [place]  [address] [phone]  Clinic Day:  07/01/2020  Referring physician: Lind Guest, NP     CHIEF COMPLAINT:  CC: Stage III renal cell carcinoma  Current Treatment:  Monthly denosumab; Afinitor 10 mg daily   HISTORY OF PRESENT ILLNESS:  Tristen Luce is a 55 y.o. 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  We began seeing him in May 2020 when he was referred by [name], NP, for a left renal mass and iron deficiency anemia.  He had transient hematuria, which later resolved, and weight loss.  In early May 2020, his hemoglobin was 11.3 with an MCV of 75. PSA was normal.  He had hypercalcemia with a calcium of 12.8.  Repeat labs a few days later revealed persistent hypercalcemia with a calcium of 13.  PTH was low at 6. Vitamin-D was low at 17.1.  He was started on vitamin D supplementation.  His hemoglobin continued to decrease to 10.8.  Serum protein electrophoresis was negative for a monoclonal spike but his gamma globulins were elevated at 4.4.  A random urine protein electrophoresis was also negative for monoclonal spike.  He was given hydrocodone/APAP 7.5/325 every 6 hours as needed for pain.  CT abdomen and pelvis revealed marked generalized swelling of the left kidney with hyperdensity throughout the renal collecting system, renal pelvis, ureter and posterior bladder consistent with gross hemorrhage with a 6-7 cm infiltrating mass in the upper pole of the left kidney.  There was no evidence of venous invasion.  There were abnormal nodes in the retroperitoneum medial to the left kidney, up to 1.5 cm in diameter.  Repeat calcium in our office at the end of May was down to 11.6.  When we saw him in early June, his hemoglobin has dropped from 10 to 9.5.  B12 was low normal and he was placed on oral vitamin B12 1000 mcg daily.  His calcium had increased to 13, so he was given IV  fluids and IV zoledronic acid 4 mg.  He reported persistent pain in his left flank and left upper inner thigh, which he continue to rate 10/10, despite the hydrocodone/APAP.  We switched him to oxycodone 10 mg every 4 hours as needed.  PET scan revealed malignancy of the kidney, but no definite evidence of metastasis.  There were two right pulmonary nodules measuring 3 mm.  MRI brain did not reveal any intracranial metastasis.  He underwent biopsy of the left kidney mass in June which revealed renal cell carcinoma.  He underwent left radical nephrectomy in August.  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  11 lymph nodes were negative for metastasis.  Tumor extended into the renal vein, but margins were clear.  Due to his high risk of recurrence, he was was recommended for adjuvant oral chemotherapy with sunitinib 50 mg daily for 2 weeks on and 1 week off for total of 1 year.  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  He was evaluated in the emergency department and placed on clonidine 0.2 mg 3 times daily.  The hypertension was uncontrolled with this, and other medications were tried.  Sunitinib was therefore discontinued after only 2 weeks of therapy.  He saw Dr. [name] for routine follow-up in February of 2021.  At that time, CT abdomen and pelvis revealed basilar  pulmonary nodules, which had increased in size and number, and were felt to be highly suspicious for metastatic disease.  There was a persistent splenic lesion, once again felt to be benign.  He was referred back to [place].  He had worsening anemia and iron panel and ferritin were equivocal, but a soluble transferrin was normal, so inconsistent with iron deficiency.  PET scan revealed several of the pulmonary nodules to be hypermetabolic, consistent with metastatic disease.  There was no evidence of recurrent or metastatic disease within the abdomen and pelvis.   We recommended palliative immunotherapy with ipilimumab/nivolumab for 4 cycles to be followed by maintenance nivolumab.  He experienced severe pain of his right shoulder and down his right arm with associated swelling.  He is unable to fully lift his arm, and his right neck is also swollen.  Ultrasound was negative for thrombosis.  His Port-A-Cath was then removed.  He was seen in clinic at the end of the day on July 1st., after he had been seen in the emergency department earlier in the day because of mild hemoptysis.  CTA chest with contrast revealed pulmonary metastases with mild progression from March 2021.  The apical segment right lower lobe nodule was 17 mm, previously 10 mm.  Mild ground-glass opacity around a right lower lobe nodule measuring 22 mm, previously 14 mm.  It was unclear if this represents pseudoprogression or true progression.  He was still complaining of severe right shoulder pain, so x-rays were ordered, and revealed a large lucent lesion in the right humeral head with cortical destruction, as well as moderate degenerative changes of the acromioclavicular and glenohumeral joints.  CT chest, abdomen and pelvis from July 2nd revealed a destructive metastatic lesion of the right proximal humerus measuring 19 mm.  Numerous pulmonary nodules were again observed, similar to the prior exam on June 30th, but new and enlarging nodules overall since March 2021.  Splenic lesion measuring 5.6 x 3.0 cm, previously 4.7 x 5.0 cm, is indeterminate, but this is suspicious for metastasis despite lack of FDG uptake, as it has enlarged since previous imaging.  Nivolumab was discontinued.  He saw Dr. [name] and received palliative radiation to the right humeral head lesion, completed on July 20th.    Due to his progressive disease he was switched to palliative oral chemotherapy with cabozantinib 60 mg daily which he started on July 21st.  This was placed on hold due to uncontrolled hypertension.  He also had  hypopigmentation of the perioral area, as well as scattered areas on his back.  He discontinued tramadol as this was causing severe headaches.  He uses hydrocodone/APAP as needed for pain.  He resumed cabozantinib on August 12th, once his blood pressure was controlled.  He had mild hyperkalemia at that time, so we increased his HCTZ to 2 tabs daily.  He has had persistent right shoulder pain, for which he uses Tylenol, as he did not tolerate any opioids.  We added mirtazapine 15 mg at bedtime for insomnia in September.  He discontinued cabozantinib in September weeks ago due to progressive vitiligo.  We recommended alternative therapy with axitinib 5 mg twice daily.  He continues denosumab every 4 weeks.  Repeat CT imaging was done prior to starting axitinib in revealed a decrease in his pulmonary nodules, without evidence of new metastatic disease in the lungs or abdomen and pelvis.  He was found to have an elevated uric acid and findings consistent with gouty arthritis, which he claims he has  dealt with in the past.  He had symptoms of shortness of breath, fatigue and hoarseness so he stopped his axitinib on his own.  He also reports one episode of coughing up bloody sputum.  CT imaging from November 9th revealed mild interval progression of bilateral pulmonary metastases, and slight interval increase in size of bilateral external iliac lymph nodes although they remain within normal limits for size.  No evidence for new or suspicious soft tissue in the nephrectomy bed.  He was restarted on axitinib at 3 mg twice daily and experienced the same side effects including shortness of breath and edema. A CT of the chest was obtained and was read as a very limited study due to respiratory motion artifact. No large or central pulmonary artery embolus identified. Known pulmonary metastatic disease and aortic atherosclerosis. He stopped medication at that time.   INTERVAL HISTORY:  Steven Peck is here for follow up after  starting Afinitor 2 weeks ago. He reports tolerating this well other than some itching. He continues to have right shoulder pain and rates this as 6/10 today. He uses hydrocodone 10 mg and Advil at night. He states that taking two hydrocodone at night as we suggested makes him feel "high" and doesn't really help with the pain. The hydrocodone and Advil seems to help a little, but he is still waking up in the middle of the night in pain. His appetite has been poor and he has lost 10 pounds since his last visit. He contributes this to the pain and states he has no appetite due to the pain. He denies fever, chills, nausea or vomiting. He denies shortness of breath, cough or chest pain. He denies issue with bowel or bladder. CBC today reveals stable hemoglobin 9.8, only slightly decreased from 10.1 at last visit. CMP is unremarkable.   REVIEW OF SYSTEMS:  Review of Systems  Constitutional: Positive for appetite change and unexpected weight change. Negative for chills, diaphoresis, fatigue and fever.  HENT:   Negative for hearing loss, lump/mass, mouth sores, nosebleeds, sore throat, tinnitus, trouble swallowing and voice change.   Eyes: Negative for eye problems and icterus.  Respiratory: Negative for chest tightness, cough, hemoptysis, shortness of breath and wheezing.   Cardiovascular: Negative for chest pain, leg swelling and palpitations.  Gastrointestinal: Negative for abdominal distention, abdominal pain, blood in stool, constipation, diarrhea, nausea, rectal pain and vomiting.  Endocrine: Negative for hot flashes.  Genitourinary: Negative for bladder incontinence, difficulty urinating, dyspareunia, dysuria, frequency, hematuria and nocturia.   Musculoskeletal: Positive for arthralgias. Negative for back pain, flank pain, gait problem, myalgias, neck pain and neck stiffness.       Right shoulder pain; 6/10  Skin: Positive for itching. Negative for rash and wound.  Neurological: Negative for  dizziness, extremity weakness, gait problem, headaches, light-headedness, numbness, seizures and speech difficulty.  Hematological: Negative for adenopathy. Does not bruise/bleed easily.  Psychiatric/Behavioral: Negative for confusion, decreased concentration, depression, sleep disturbance and suicidal ideas. The patient is not nervous/anxious.      VITALS:  Blood pressure (!) 147/89, pulse (!) 104, temperature 98.4 F (36.9 C), temperature source Oral, resp. rate 18, height 5\' 10"  (1.778 m), weight 224 lb 12.8 oz (102 kg), SpO2 100 %.  Wt Readings from Last 3 Encounters:  07/01/20 224 lb 12.8 oz (102 kg)  06/06/20 234 lb 8 oz (106.4 kg)  06/04/20 232 lb 6.4 oz (105.4 kg)    Body mass index is 32.26 kg/m.  Performance status (ECOG): 1 - Symptomatic but  completely ambulatory  PHYSICAL EXAM:  Physical Exam Constitutional:      General: He is not in acute distress.    Appearance: Normal appearance. He is normal weight. He is not ill-appearing, toxic-appearing or diaphoretic.  HENT:     Head: Normocephalic and atraumatic.     Right Ear: Tympanic membrane normal.     Left Ear: Tympanic membrane normal.     Nose: Nose normal. No congestion or rhinorrhea.     Mouth/Throat:     Mouth: Mucous membranes are moist.     Pharynx: Oropharynx is clear. No oropharyngeal exudate or posterior oropharyngeal erythema.  Eyes:     General: No scleral icterus.       Right eye: No discharge.        Left eye: No discharge.     Extraocular Movements: Extraocular movements intact.     Conjunctiva/sclera: Conjunctivae normal.     Pupils: Pupils are equal, round, and reactive to light.  Neck:     Vascular: No carotid bruit.  Cardiovascular:     Rate and Rhythm: Normal rate and regular rhythm.     Heart sounds: No murmur heard. No friction rub. No gallop.   Pulmonary:     Effort: Pulmonary effort is normal. No respiratory distress.     Breath sounds: Normal breath sounds. No stridor. No wheezing,  rhonchi or rales.  Chest:     Chest wall: No tenderness.  Abdominal:     General: Abdomen is flat. Bowel sounds are normal. There is no distension.     Palpations: There is no mass.     Tenderness: There is no abdominal tenderness. There is no right CVA tenderness, left CVA tenderness, guarding or rebound.     Hernia: No hernia is present.  Musculoskeletal:        General: No swelling, tenderness, deformity or signs of injury. Normal range of motion.     Cervical back: Normal range of motion and neck supple. No rigidity or tenderness.     Right lower leg: No edema.     Left lower leg: No edema.  Lymphadenopathy:     Cervical: No cervical adenopathy.  Skin:    General: Skin is warm and dry.     Capillary Refill: Capillary refill takes less than 2 seconds.     Coloration: Skin is not jaundiced or pale.     Findings: Rash present. No bruising, erythema or lesion.     Comments: Left lower extremity slight rash to the outside calf  Neurological:     General: No focal deficit present.     Mental Status: He is alert and oriented to person, place, and time. Mental status is at baseline.     Cranial Nerves: No cranial nerve deficit.     Sensory: No sensory deficit.     Motor: No weakness.     Coordination: Coordination normal.     Gait: Gait normal.     Deep Tendon Reflexes: Reflexes normal.  Psychiatric:        Mood and Affect: Mood normal.        Behavior: Behavior normal.        Thought Content: Thought content normal.        Judgment: Judgment normal.     LABS:   CBC Latest Ref Rng & Units 07/01/2020 06/04/2020 05/26/2020  WBC - 4.6 4.7 4.4  Hemoglobin 13.5 - 17.5 9.8(A) 10.1(A) 9.9(A)  Hematocrit 41 - 53 32(A) 32(A) 31(A)  Platelets 150 -  399 213 263 248   CMP Latest Ref Rng & Units 07/01/2020 06/04/2020 05/26/2020  Glucose 70 - 99 mg/dL - - -  BUN 4 - 21 25(A) 17 17  Creatinine 0.6 - 1.3 1.2 1.2 1.2  Sodium 137 - 147 137 138 138  Potassium 3.4 - 5.3 4.7 4.3 4.3  Chloride 99 -  108 103 104 100  CO2 13 - 22 28(A) 25(A) 18  Calcium 8.7 - 10.7 9.8 9.9 9.4  Alkaline Phos 25 - 125 92 79 71  AST 14 - 40 $Re'26 20 21  'GZz$ ALT 10 - 40 $Re'19 14 11     'uRr$ STUDIES:   No current studies  Allergies:  Allergies  Allergen Reactions  . Hydrochlorothiazide Swelling  . Lisinopril Swelling    Angioedema of lips  . Tramadol     Current Medications: Current Outpatient Medications  Medication Sig Dispense Refill  . morphine (MS CONTIN) 15 MG 12 hr tablet Take 1 tablet (15 mg total) by mouth every 12 (twelve) hours. 60 tablet 0  . allopurinol (ZYLOPRIM) 100 MG tablet Take 100 mg by mouth daily.    . ANUCORT-HC 25 MG suppository Place 25 mg rectally 2 (two) times daily as needed.    . calcium citrate-vitamin D (CITRACAL+D) 315-200 MG-UNIT tablet Take 1 tablet by mouth 2 (two) times daily.    . Denosumab (XGEVA [place]) Inject into the skin.    Marland Kitchen everolimus (AFINITOR) 10 MG tablet Take 10 mg by mouth daily.    Marland Kitchen HYDROcodone-acetaminophen (NORCO) 10-325 MG tablet Take 1 tablet by mouth every 4 (four) hours as needed (for pain). 120 tablet 0  . hydrocortisone cream 1 % Apply 1 application topically 2 (two) times daily.    Marland Kitchen lactulose (CHRONULAC) 10 GM/15ML solution Take 10 g by mouth at bedtime as needed for mild constipation.    . Multiple Vitamins-Minerals (MULTIVITAMIN WITH MINERALS) tablet Take 1 tablet by mouth daily.    Marland Kitchen NARCAN 4 MG/0.1ML LIQD nasal spray kit SMARTSIG:1 Both Nares Daily PRN    . ondansetron (ZOFRAN) 4 MG tablet Take 4 mg by mouth every 8 (eight) hours as needed for nausea or vomiting.    . Oxycodone HCl 10 MG TABS Take 1 tablet (10 mg total) by mouth every 4 (four) hours as needed (pain). 15 tablet 0  . prochlorperazine (COMPAZINE) 10 MG tablet Take 10 mg by mouth every 6 (six) hours as needed for nausea or vomiting.    . traMADol (ULTRAM) 50 MG tablet Take 50 mg by mouth every 6 (six) hours as needed.    . triamcinolone (KENALOG) 0.025 % cream Apply 1 application  topically 2 (two) times daily.    . vitamin B-12 (CYANOCOBALAMIN) 100 MCG tablet Take 100 mcg by mouth daily.     No current facility-administered medications for this visit.     ASSESSMENT & PLAN:   Assessment:   1. Stage III renal cell carcinoma status post radical left nephrectomy.  Due to the stage and renal vein invasion, we recommended adjuvant therapy with sunitinib, which he could not tolerate due to severe uncontrolled hypertension.  He was then switched to ipilimumab/nivolumab, but unfortunately had progressive disease.  He was taking oral cabozantinib, but had recurrent hypertension after just 2 weeks of therapy.  Cabozantinib was held due to hypertension, then resumed once the hypertension was controlled.  Due to progressive vitiligo, the patient discontinue cabozantinib.  He was placed on axitinib 5 mg twice daily and has since stopped himself  due to toxicities including bilateral lower extremity and right hand edema, shortness of breath, fatigue and hoarseness. He was then reduced to 3 mg twice daily and had recurrence of same toxicities. He has since stopped axitinib completely. He started Afinitor 10 mg daily 2 weeks ago and is tolerating well.   2. Anemia, which has improved, likely due to dehydration.  We will continue to monitor this. Hemoglobin is fairly stable today at 9.8.  3. Vitamin-D deficiency, he continues vitamin-D 3 2000 international units daily.  4. Hypertension, which has been very difficult to control.  He remains on multiple medications.  His blood pressure prior to starting cabozantinib was 126/81. He states his PCP stopped all but one blood pressure medicine; however, he can't remember which one and only takes it occasionally  5. Recurrent renal cell carcinoma with PET positive pulmonary nodules.  CT imaging July revealed new and enlarging pulmonary nodules.  He was receiving palliative cabozantinib, which is now discontinued due to dermatologic toxicities.  He  was started on axitinib, but has since discontinued that due to toxicities. He is currently taking Afinitor 10 mg daily without difficulties.  6. Mildly abnormal kidney function, in part due to dehydration, improved.    7. Destructive metastatic lesion in the right humeral head measuring 19 mm.  He received palliative radiation with better control of his pain.  He continues to have severe pain.   8. Episode of hemoptysis in late June.  CTA imaging revealed pulmonary metastases with progression from March 2021.  Apical segment right lower lobe nodule now measures 17 mm, previously 10 mm.  Mild ground-glass opacity around a right lower lobe nodule measuring 22 mm, previously 14 mm.  CT imaging from July also confirmed worsening disease.  He has not had further episodes of hemoptysis.  9. Splenic lesion measuring 5.6 x 3.0 cm, which is suspicious for metastatic disease.     Plan: He is tolerating his Afinitor well other than some itching. I advised that he can use Benadryl for the itching or any other OTC antihistamine if the Benadryl makes him drowsy. He is due for denosumab Friday. In regards to his pain, we had a long discussion regarding long acting pain meds. I explained that this may benefit him and offer him better overall pain control as long as he is consistent with the dosing. I explained that taking it occasionally will not give him any benefit. He is willing to try something as the pain has begun to interfere with his day to day. I will send in MS extended release 15 mg to be taken twice daily. He continues to work and I completed his FMLA papers in the office today. We will plan to see him in 4 weeks with repeat CBC, CMP and examination. Both he and his wife, who was present for this visit verbalized understanding of and agreement to the plans discussed today. They know to call the office should any new questions or concerns arise.    I provided 30 minutes of face-to-face time during this  this encounter and > 50% was spent counseling as documented under my assessment and plan.    Melodye Ped, NP [facility] [address] [place] [address] Dept: [phone] Dept Fax: [phone]

## 2020-07-04 ENCOUNTER — Inpatient Hospital Stay: Payer: BC Managed Care – PPO

## 2020-07-04 ENCOUNTER — Other Ambulatory Visit: Payer: Self-pay

## 2020-07-04 ENCOUNTER — Telehealth: Payer: Self-pay

## 2020-07-04 VITALS — BP 169/79 | HR 101 | Temp 98.4°F | Resp 18 | Ht 70.0 in | Wt 228.1 lb

## 2020-07-04 DIAGNOSIS — Z79899 Other long term (current) drug therapy: Secondary | ICD-10-CM | POA: Diagnosis not present

## 2020-07-04 DIAGNOSIS — C642 Malignant neoplasm of left kidney, except renal pelvis: Secondary | ICD-10-CM | POA: Diagnosis not present

## 2020-07-04 DIAGNOSIS — I10 Essential (primary) hypertension: Secondary | ICD-10-CM | POA: Diagnosis not present

## 2020-07-04 DIAGNOSIS — C7801 Secondary malignant neoplasm of right lung: Secondary | ICD-10-CM | POA: Diagnosis not present

## 2020-07-04 DIAGNOSIS — C7951 Secondary malignant neoplasm of bone: Secondary | ICD-10-CM | POA: Diagnosis not present

## 2020-07-04 DIAGNOSIS — G4700 Insomnia, unspecified: Secondary | ICD-10-CM | POA: Diagnosis not present

## 2020-07-04 DIAGNOSIS — D509 Iron deficiency anemia, unspecified: Secondary | ICD-10-CM | POA: Diagnosis not present

## 2020-07-04 MED ORDER — DENOSUMAB 120 MG/1.7ML ~~LOC~~ SOLN
SUBCUTANEOUS | Status: AC
  Filled 2020-07-04: qty 1.7

## 2020-07-04 MED ORDER — DENOSUMAB 120 MG/1.7ML ~~LOC~~ SOLN
120.0000 mg | Freq: Once | SUBCUTANEOUS | Status: AC
  Administered 2020-07-04: 120 mg via SUBCUTANEOUS

## 2020-07-04 NOTE — Telephone Encounter (Addendum)
07/15/20 Pt finally returned my call @ 1526 today. Pt took his last Afinitor last night. [facility] is supposed to have a new shipment to him by Thursday,07/17/20. He takes the Afinitor @ night with food. No missed doses prior to 07/14/20. He will miss tonight's dose, but will restart tomorrow night. Pt states, "I'm doing good with the medicine. I've had a little trouble with constipation. I took that stuff  and then I had the runs for 2-3 days. I'm good right now". I asked pt if he was taking anything daily for constipation. He replied, "No, I didn't know I need to take it everyday". I educated him that he should take the stool softener 1 tab po BID, as long as he is taking pain medication. Pt verbalized understanding.    2/10 @ 1001:  I LVM asking pt to return my call regarding Afinitor.                   07/09/20 @ 1214--- I LVM on pt's identified voicemail, asking for return call.   07/04/20 -  I attempted to call pt to see how he is tolerating the Afinitor. Is he having any N/V, skin rashes, or diarrhea? Any missed doses since last call? When he takes the medication, is it with or without food?

## 2020-07-04 NOTE — Patient Instructions (Signed)
Denosumab injection What is this medicine? DENOSUMAB (den oh sue mab) slows bone breakdown. Prolia is used to treat osteoporosis in women after menopause and in men, and in people who are taking corticosteroids for 6 months or more. Xgeva is used to treat a high calcium level due to cancer and to prevent bone fractures and other bone problems caused by multiple myeloma or cancer bone metastases. Xgeva is also used to treat giant cell tumor of the bone. This medicine may be used for other purposes; ask your health care provider or pharmacist if you have questions. COMMON BRAND NAME(S): Prolia, XGEVA What should I tell my health care provider before I take this medicine? They need to know if you have any of these conditions:  dental disease  having surgery or tooth extraction  infection  kidney disease  low levels of calcium or Vitamin D in the blood  malnutrition  on hemodialysis  skin conditions or sensitivity  thyroid or parathyroid disease  an unusual reaction to denosumab, other medicines, foods, dyes, or preservatives  pregnant or trying to get pregnant  breast-feeding How should I use this medicine? This medicine is for injection under the skin. It is given by a health care professional in a hospital or clinic setting. A special MedGuide will be given to you before each treatment. Be sure to read this information carefully each time. For Prolia, talk to your pediatrician regarding the use of this medicine in children. Special care may be needed. For Xgeva, talk to your pediatrician regarding the use of this medicine in children. While this drug may be prescribed for children as young as 13 years for selected conditions, precautions do apply. Overdosage: If you think you have taken too much of this medicine contact a poison control center or emergency room at once. NOTE: This medicine is only for you. Do not share this medicine with others. What if I miss a dose? It is  important not to miss your dose. Call your doctor or health care professional if you are unable to keep an appointment. What may interact with this medicine? Do not take this medicine with any of the following medications:  other medicines containing denosumab This medicine may also interact with the following medications:  medicines that lower your chance of fighting infection  steroid medicines like prednisone or cortisone This list may not describe all possible interactions. Give your health care provider a list of all the medicines, herbs, non-prescription drugs, or dietary supplements you use. Also tell them if you smoke, drink alcohol, or use illegal drugs. Some items may interact with your medicine. What should I watch for while using this medicine? Visit your doctor or health care professional for regular checks on your progress. Your doctor or health care professional may order blood tests and other tests to see how you are doing. Call your doctor or health care professional for advice if you get a fever, chills or sore throat, or other symptoms of a cold or flu. Do not treat yourself. This drug may decrease your body's ability to fight infection. Try to avoid being around people who are sick. You should make sure you get enough calcium and vitamin D while you are taking this medicine, unless your doctor tells you not to. Discuss the foods you eat and the vitamins you take with your health care professional. See your dentist regularly. Brush and floss your teeth as directed. Before you have any dental work done, tell your dentist you are   receiving this medicine. Do not become pregnant while taking this medicine or for 5 months after stopping it. Talk with your doctor or health care professional about your birth control options while taking this medicine. Women should inform their doctor if they wish to become pregnant or think they might be pregnant. There is a potential for serious side  effects to an unborn child. Talk to your health care professional or pharmacist for more information. What side effects may I notice from receiving this medicine? Side effects that you should report to your doctor or health care professional as soon as possible:  allergic reactions like skin rash, itching or hives, swelling of the face, lips, or tongue  bone pain  breathing problems  dizziness  jaw pain, especially after dental work  redness, blistering, peeling of the skin  signs and symptoms of infection like fever or chills; cough; sore throat; pain or trouble passing urine  signs of low calcium like fast heartbeat, muscle cramps or muscle pain; pain, tingling, numbness in the hands or feet; seizures  unusual bleeding or bruising  unusually weak or tired Side effects that usually do not require medical attention (report to your doctor or health care professional if they continue or are bothersome):  constipation  diarrhea  headache  joint pain  loss of appetite  muscle pain  runny nose  tiredness  upset stomach This list may not describe all possible side effects. Call your doctor for medical advice about side effects. You may report side effects to FDA at 1-800-FDA-1088. Where should I keep my medicine? This medicine is only given in a clinic, doctor's office, or other health care setting and will not be stored at home. NOTE: This sheet is a summary. It may not cover all possible information. If you have questions about this medicine, talk to your doctor, pharmacist, or health care provider.  2021 [employer] (2017-09-23 16:10:44)

## 2020-07-17 ENCOUNTER — Other Ambulatory Visit: Payer: Self-pay | Admitting: Hematology and Oncology

## 2020-07-24 ENCOUNTER — Telehealth: Payer: Self-pay

## 2020-07-24 NOTE — Telephone Encounter (Signed)
I spoke with pt regarding his Afinitor 10mg  po qd. He continues to take the Afinitor @ night with food. He has only missed 1 dose, due to shipment of Afinitor arriving 1 day late. He denies N/V, skin rashes, diarrhea and fevers. He states his constipation is a little better. He is taking a stool softener daily. I told him he can increase to twice a day or take 2 stool softeners at the same time if he prefers. He is having some pain in his right shoulder @ times, he is using Advil and morphine to make pain tolerable. He plans to discuss possible shoulder xray when he comes in next week to see doctor.

## 2020-07-29 ENCOUNTER — Telehealth: Payer: Self-pay | Admitting: Hematology and Oncology

## 2020-07-29 ENCOUNTER — Other Ambulatory Visit: Payer: Self-pay

## 2020-07-29 ENCOUNTER — Ambulatory Visit: Payer: BC Managed Care – PPO | Admitting: Oncology

## 2020-07-29 ENCOUNTER — Inpatient Hospital Stay (INDEPENDENT_AMBULATORY_CARE_PROVIDER_SITE_OTHER): Payer: BC Managed Care – PPO | Admitting: Hematology and Oncology

## 2020-07-29 ENCOUNTER — Inpatient Hospital Stay: Payer: BC Managed Care – PPO

## 2020-07-29 ENCOUNTER — Encounter: Payer: Self-pay | Admitting: Hematology and Oncology

## 2020-07-29 ENCOUNTER — Inpatient Hospital Stay: Payer: BC Managed Care – PPO | Attending: Oncology

## 2020-07-29 ENCOUNTER — Other Ambulatory Visit: Payer: Self-pay | Admitting: Hematology and Oncology

## 2020-07-29 VITALS — BP 154/91 | HR 97 | Temp 98.5°F | Resp 18 | Ht 70.0 in | Wt 220.4 lb

## 2020-07-29 DIAGNOSIS — I10 Essential (primary) hypertension: Secondary | ICD-10-CM | POA: Insufficient documentation

## 2020-07-29 DIAGNOSIS — C7802 Secondary malignant neoplasm of left lung: Secondary | ICD-10-CM

## 2020-07-29 DIAGNOSIS — C642 Malignant neoplasm of left kidney, except renal pelvis: Secondary | ICD-10-CM | POA: Insufficient documentation

## 2020-07-29 DIAGNOSIS — D649 Anemia, unspecified: Secondary | ICD-10-CM | POA: Diagnosis not present

## 2020-07-29 DIAGNOSIS — C7801 Secondary malignant neoplasm of right lung: Secondary | ICD-10-CM

## 2020-07-29 DIAGNOSIS — G893 Neoplasm related pain (acute) (chronic): Secondary | ICD-10-CM | POA: Insufficient documentation

## 2020-07-29 DIAGNOSIS — C7951 Secondary malignant neoplasm of bone: Secondary | ICD-10-CM | POA: Diagnosis not present

## 2020-07-29 DIAGNOSIS — E559 Vitamin D deficiency, unspecified: Secondary | ICD-10-CM | POA: Insufficient documentation

## 2020-07-29 DIAGNOSIS — Z79899 Other long term (current) drug therapy: Secondary | ICD-10-CM | POA: Insufficient documentation

## 2020-07-29 DIAGNOSIS — C7800 Secondary malignant neoplasm of unspecified lung: Secondary | ICD-10-CM | POA: Insufficient documentation

## 2020-07-29 DIAGNOSIS — D509 Iron deficiency anemia, unspecified: Secondary | ICD-10-CM | POA: Insufficient documentation

## 2020-07-29 LAB — CBC AND DIFFERENTIAL
HCT: 27 — AB (ref 41–53)
Hemoglobin: 8.3 — AB (ref 13.5–17.5)
Neutrophils Absolute: 2.4
Platelets: 271 (ref 150–399)
WBC: 4.8

## 2020-07-29 LAB — HEPATIC FUNCTION PANEL
ALT: 13 (ref 10–40)
AST: 22 (ref 14–40)
Alkaline Phosphatase: 85 (ref 25–125)
Bilirubin, Total: 0.4

## 2020-07-29 LAB — BASIC METABOLIC PANEL
BUN: 17 (ref 4–21)
CO2: 24 — AB (ref 13–22)
Chloride: 106 (ref 99–108)
Creatinine: 1.1 (ref 0.6–1.3)
Glucose: 98
Potassium: 4.5 (ref 3.4–5.3)
Sodium: 136 — AB (ref 137–147)

## 2020-07-29 LAB — COMPREHENSIVE METABOLIC PANEL
Albumin: 3.9 (ref 3.5–5.0)
Calcium: 8.7 (ref 8.7–10.7)

## 2020-07-29 LAB — TSH: TSH: 1.2 (ref 0.41–5.90)

## 2020-07-29 LAB — VITAMIN B12: Vitamin B-12: 493

## 2020-07-29 LAB — CBC
MCV: 72 — AB (ref 80–94)
RBC: 3.7 — AB (ref 3.87–5.11)

## 2020-07-29 LAB — IRON,TIBC AND FERRITIN PANEL
Ferritin: 681
Iron: <10
TIBC: 234

## 2020-07-29 MED ORDER — MORPHINE SULFATE ER 30 MG PO TBCR: 30.0000 mg | EXTENDED_RELEASE_TABLET | Freq: Two times a day (BID) | ORAL | 0 refills | Status: DC

## 2020-07-29 MED ORDER — HYDROCORTISONE ACETATE 25 MG RE SUPP: 25.0000 mg | Freq: Two times a day (BID) | RECTAL | 0 refills | Status: DC

## 2020-07-29 NOTE — Progress Notes (Signed)
[facility]  [address] 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  Current Treatment:    Palliative everolimus 10 mg daily.   HISTORY OF PRESENT ILLNESS:  Steven Peck is a 55 y.o. 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  We began seeing him in May 2020 when he was referred by [name], NP, for a left renal mass and iron deficiency anemia.  He had transient hematuria, which later resolved, and weight loss.  In early May 2020, his hemoglobin was 11.3 with an MCV of 75. PSA was normal.  He had hypercalcemia with a calcium of 12.8.  Repeat labs a few days later revealed persistent hypercalcemia with a calcium of 13.  PTH was low at 6. Vitamin-D was low at 17.1.  He was started on vitamin D supplementation.  His hemoglobin continued to decrease to 10.8.  Serum protein electrophoresis was negative for a monoclonal spike but his gamma globulins were elevated at 4.4.  A random urine protein electrophoresis was also negative for monoclonal spike.  He was given hydrocodone/APAP 7.5/325 every 6 hours as needed for pain.  CT abdomen and pelvis revealed marked generalized swelling of the left kidney with hyperdensity throughout the renal collecting system, renal pelvis, ureter and posterior bladder consistent with gross hemorrhage with a 6-7 cm infiltrating mass in the upper pole of the left kidney.  There was no evidence of venous invasion.  There were abnormal nodes in the retroperitoneum medial to the left kidney, up to 1.5 cm in diameter.  Repeat calcium in our office at the end of May was down to 11.6.  When we saw him in early June, his hemoglobin has dropped from 10 to 9.5.  B12 was low normal and he was placed on oral vitamin B12 1000 mcg daily.  His  calcium had increased to 13, so he was given IV fluids and IV zoledronic acid 4 mg.  He reported persistent pain in his left flank and left upper inner thigh, which he continue to rate 10/10, despite the hydrocodone/APAP.  We switched him to oxycodone 10 mg every 4 hours as needed.  PET scan revealed malignancy of the kidney, but no definite evidence of metastasis.  There were two right pulmonary nodules measuring 3 mm.  MRI brain did not reveal any intracranial metastasis.  He underwent biopsy of the left kidney mass in June which revealed renal cell carcinoma.  He underwent left radical nephrectomy in August. 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  11 lymph nodes were negative for metastasis.  Tumor extended into the renal vein, but margins were clear.  He received IV iron in the form of Feraheme in July 2020.  Due to his high risk of recurrence, he was recommended for adjuvant oral chemotherapy with sunitinib 50 mg daily for 2 weeks on and 1 week off for total of 1 year.  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  He was evaluated in the emergency department and placed on clonidine 0.2 mg 3 times daily.  The hypertension was uncontrolled with this as well as other medications.  Sunitinib was therefore discontinued after only 2 weeks of therapy.  He saw Dr. [name]  for routine follow-up in February 2021.  At that time, CT abdomen and pelvis revealed basilar pulmonary nodules, which had increased in size and number, and were felt to be highly suspicious for metastatic disease.  There was a persistent splenic lesion, once again felt to be benign.  He was referred back to [place].  He had worsening anemia and iron panel and ferritin were equivocal, but a soluble transferrin was normal, so inconsistent with iron deficiency. PET scan revealed several of the pulmonary nodules to be hypermetabolic, consistent with metastatic  disease.  There was no evidence of recurrent or metastatic disease within the abdomen and pelvis.  We recommended palliative immunotherapy with ipilimumab/nivolumab for 4 cycles to be followed by maintenance nivolumab.  He experienced severe pain of his right shoulder and down his right arm with associated swelling.  He is unable to fully lift his arm, and his right neck is also swollen. Ultrasound was negative for thrombosis.  His Port-A-Cath was then removed.  He was seen in clinic at the end of the day on July 1st., after he had been seen in the emergency department earlier in the day because of mild hemoptysis.  CTA chest with contrast revealed pulmonary metastases with mild progression from March 2021.  The apical segment right lower lobe nodule was 17 mm, previously 10 mm.  Mild ground-glass opacity around a right lower lobe nodule measuring 22 mm, previously 14 mm.  It was unclear if this represents pseudoprogression or true progression.  He had persistentsevere right shoulder pain, despite tramadol.  Plain films of the right shoulderIn July revealed a large lucent lesion in the right humeral head with cortical destruction, as well as moderate degenerative changes of the acromioclavicular and glenohumeral joints.  CT chest, abdomen and pelvis in July revealed a destructive metastatic lesion of the right proximal humerus measuring 19 mm.  Numerous pulmonary nodules were again observed, similar to June, but new and enlarging nodules overall since March 2021.  The splenic lesion measuring 5.6 x 3.0 cm, previously 4.7 x 5.0 cm, was indeterminate, but suspicious for metastasis despite lack of FDG uptake, as it has enlarged since previous imaging.  Nivolumab was discontinued.  He saw Dr. [name] and received palliative radiation to the right humeral head lesion in July.   He was placed on denosumab every 4 weeks for the bone metastasis.  Due to his progressive disease he was switched to palliative oral  chemotherapy with cabozantinib 60 mg daily in July. Cabozantinib had to be held briefly due to uncontrolled hypertension.  He also had hypopigmentation of the perioral area and upper  back.  He discontinued tramadol as this was causing severe headaches and was using hydrocodone/APAP as needed for pain, but he did not tolerate that either.  He resumed cabozantinib in August once his blood pressure was controlled.  He had mild hyperkalemia at that time, so we increased his HCTZ to 2 tabs daily.  He had persistent right shoulder pain, for which he was using Tylenol, then Advil.  We added mirtazapine 15 mg at bedtime for insomnia in September.  He discontinued cabozantinib in September due to progressive vitiligo.  We recommended alternative therapy with axitinib 5 mg twice daily.  He has continueed denosumab every 4 weeks.  Repeat CT chest, abdomen and pelvis in September prior to starting axitinib revealed a decrease in the pulmonary nodules, without evidence of new metastatic disease in the lungs or abdomen and pelvis.  He was found to have an elevated  uric acid and findings consistent with gouty arthritis, which he claims he has dealt with in the past.  He had symptoms of shortness of breath, fatigue and hoarseness, as well as a single episode of hemoptysis, so he stopped his axitinib on his own. Repeat CT imaging  in November revealed mild interval progression of bilateral pulmonary metastases, and slight interval increase in size of bilateral external iliac lymph nodes although they remain within normal limits for size.  No evidence for recurrent in the nephrectomy bed was seen.  We resumed axitinib at a lower dose of 3 mg twice daily but unfortunately, he experienced the same side effects, including shortness of breath and edema. CTA chest in late November was read as a very limited study due to respiratory motion artifact. No large or central pulmonary artery embolus identified. Known pulmonary metastatic  disease and aortic atherosclerosis.  Axitinib was discontinued. He eventually resumed hydrocodone/APAP 10/325 at bedtime.  We recommended alternative treatment with everolimus 10 mg daily which he started on January 15th. At his visit on February 1st, he was tolerating everolimus fairly well, except for pruritus.  He was given triamcinolone cream.  He had continued right shoulder pain, which was waking him up at night despite hydrocodone /APAP and Advil at bedtime, so we started him on MS Contin 15 mg every 12 hours.  INTERVAL HISTORY:  Steven Peck is here today for repeat clinical assessment.  He continues to tolerate everolimus well.  He has had intermittent pruritus without rash.  He is using triamcinolone cream as needed. He states his right shoulder pain is slightly improved with the MS Contin 15 mg every 12 hours. He continues to use hydrocodone/APAP and ibuprofen at bedtime and his sleep is interrupted due to pain.  He reports mild fatigue and occasional mild chills.  He denies dyspnea or chest pain.  He reports chronic rectal bleeding, which he attributes to a hemorrhoid.  He has never had a colonoscopy. He had been referred, but was then diagnosed with his cancer, so did not keep the appointment.  He is not sure which physician his appointment was with. He states he tried a foam for the hemorrhoid/bleeding without improvement. He denies hematuria or hemoptysis.  He has chronic constipation, for which he is using senna S.  He reports occasional upper abdominal pain.  He states he does not always eat prior to taking ibuprofen.  I advised him to avoid ibuprofen due to the significant anemia. He denies fevers or chills. His appetite is decreased due to pain. His weight has decreased 8 pounds over last 4 weeks.  REVIEW OF SYSTEMS:  Review of Systems  Constitutional: Positive for appetite change, chills, fatigue and unexpected weight change. Negative for fever.  HENT:   Negative for lump/mass, mouth sores  and sore throat.   Respiratory: Negative for cough, hemoptysis and shortness of breath.   Cardiovascular: Negative for chest pain and leg swelling.  Gastrointestinal: Positive for abdominal pain, blood in stool and constipation. Negative for diarrhea, nausea and vomiting.  Genitourinary: Positive for difficulty urinating (occasional slow stream). Negative for dysuria, frequency and hematuria.   Musculoskeletal: Positive for arthralgias. Negative for back pain.  Skin: Positive for itching. Negative for rash.  Neurological: Negative for dizziness, headaches and light-headedness.  Psychiatric/Behavioral: Positive for sleep disturbance. Negative for depression. The patient is not nervous/anxious.      VITALS:  Blood pressure (!) 154/91, pulse 97, temperature 98.5 F (36.9 C), resp. rate 18, height $RemoveBe'5\' 10"'uFVfWJenC$  (1.778 m),  weight 220 lb 6.4 oz (100 kg), SpO2 100 %.  Wt Readings from Last 3 Encounters:  07/29/20 220 lb 6.4 oz (100 kg)  07/04/20 228 lb 1 oz (103.4 kg)  07/01/20 224 lb 12.8 oz (102 kg)    Body mass index is 31.62 kg/m.  Performance status (ECOG): 1 - Symptomatic but completely ambulatory  PHYSICAL EXAM:  Physical Exam Vitals and nursing note reviewed.  Constitutional:      Appearance: Normal appearance. He is normal weight.  HENT:     Head: Normocephalic and atraumatic.     Mouth/Throat:     Mouth: Mucous membranes are moist.     Pharynx: Oropharynx is clear. No oropharyngeal exudate or posterior oropharyngeal erythema.  Eyes:     General: No scleral icterus.    Extraocular Movements: Extraocular movements intact.     Conjunctiva/sclera: Conjunctivae normal.     Pupils: Pupils are equal, round, and reactive to light.  Cardiovascular:     Rate and Rhythm: Normal rate and regular rhythm.     Heart sounds: Normal heart sounds. No murmur heard. No friction rub. No gallop.   Pulmonary:     Effort: Pulmonary effort is normal.     Breath sounds: Normal breath sounds. No  wheezing, rhonchi or rales.  Chest:  Breasts:     Right: No axillary adenopathy or supraclavicular adenopathy.     Left: No axillary adenopathy or supraclavicular adenopathy.    Abdominal:     General: Bowel sounds are normal. There is no distension.     Palpations: Abdomen is soft. There is no hepatomegaly, splenomegaly or mass.     Tenderness: There is no abdominal tenderness.  Musculoskeletal:        General: Normal range of motion.     Cervical back: Normal range of motion and neck supple. No tenderness.     Right lower leg: No edema.     Left lower leg: No edema.  Lymphadenopathy:     Cervical: No cervical adenopathy.     Upper Body:     Right upper body: No supraclavicular or axillary adenopathy.     Left upper body: No supraclavicular or axillary adenopathy.     Lower Body: No right inguinal adenopathy. No left inguinal adenopathy.  Skin:    General: Skin is warm and dry.     Coloration: Skin is not jaundiced.     Findings: No rash.  Neurological:     Mental Status: He is alert and oriented to person, place, and time.     Cranial Nerves: No cranial nerve deficit.  Psychiatric:        Mood and Affect: Mood normal.        Behavior: Behavior normal.        Thought Content: Thought content normal.    LABS:   CBC Latest Ref Rng & Units 07/29/2020 07/01/2020 06/04/2020  WBC - 4.8 4.6 4.7  Hemoglobin 13.5 - 17.5 8.3(A) 9.8(A) 10.1(A)  Hematocrit 41 - 53 27(A) 32(A) 32(A)  Platelets 150 - 399 271 213 263   CMP Latest Ref Rng & Units 07/29/2020 07/01/2020 06/04/2020  Glucose 70 - 99 mg/dL - - -  BUN 4 - 21 17 38(U) 17  Creatinine 0.6 - 1.3 1.1 1.2 1.2  Sodium 137 - 147 136(A) 137 138  Potassium 3.4 - 5.3 4.5 4.7 4.3  Chloride 99 - 108 106 103 104  CO2 13 - 22 24(A) 28(A) 25(A)  Calcium 8.7 - 10.7 8.7 9.8  9.9  Alkaline Phos 25 - 125 85 92 79  AST 14 - 40 $Re'22 26 20  'yfY$ ALT 10 - 40 $Re'13 19 14     'YHv$ No results found for: CEA1 / No results found for: CEA1 No results found for:  PSA1 No results found for: OHY073 No results found for: CAN125  No results found for: TOTALPROTELP, ALBUMINELP, A1GS, A2GS, BETS, BETA2SER, GAMS, MSPIKE, SPEI Lab Results  Component Value Date   TIBC 234 07/29/2020   FERRITIN 681 07/29/2020   IRONPCTSAT TNP 07/29/2020   Lab Results  Component Value Date   LDH 302 (A) 07/29/2020    STUDIES:  No results found.    HISTORY:   Past Medical History:  Diagnosis Date   Anemia    Asthma    as a child   Cancer ([place])    Left renal cancer   Constipation    Hypertension    Malignant neoplasm of left kidney, except renal pelvis [facility])     Past Surgical History:  Procedure Laterality Date   CYSTOSCOPY N/A 01/10/2019   Procedure: CYSTOSCOPY;  Surgeon: Alexis Frock, MD;  Location: [facility];  Service: Urology;  Laterality: N/A;   NO PAST SURGERIES     ROBOT ASSISTED LAPAROSCOPIC NEPHRECTOMY Left 01/10/2019   Procedure: XI ROBOTIC ASSISTED LAPAROSCOPIC NEPHRECTOMY;  Surgeon: Alexis Frock, MD;  Location: [facility];  Service: Urology;  Laterality: Left;  3 HRS    History reviewed. No pertinent family history.  Social History:  reports that he has never smoked. He has never used smokeless tobacco. He reports previous alcohol use. He reports that he does not use drugs.The patient is accompanied by his wife today.  Allergies:  Allergies  Allergen Reactions   Hydrochlorothiazide Swelling   Lisinopril Swelling    Angioedema of lips   Tramadol     Current Medications: Current Outpatient Medications  Medication Sig Dispense Refill   hydrocortisone (ANUSOL-HC) 25 MG suppository Place 1 suppository (25 mg total) rectally 2 (two) times daily. 12 suppository 0   morphine (MS CONTIN) 30 MG 12 hr tablet Take 1 tablet (30 mg total) by mouth every 12 (twelve) hours. 60 tablet 0   allopurinol (ZYLOPRIM) 100 MG tablet Take 100 mg by mouth daily.     ANUCORT-HC 25 MG suppository Place 25 mg rectally 2 (two) times daily as needed.      calcium citrate-vitamin D (CITRACAL+D) 315-200 MG-UNIT tablet Take 1 tablet by mouth 2 (two) times daily.     Denosumab (XGEVA [place]) Inject into the skin.     everolimus (AFINITOR) 10 MG tablet Take 10 mg by mouth daily.     HYDROcodone-acetaminophen (NORCO) 10-325 MG tablet Take 1 tablet by mouth every 4 (four) hours as needed (for pain). 120 tablet 0   hydrocortisone cream 1 % Apply 1 application topically 2 (two) times daily.     lactulose (CHRONULAC) 10 GM/15ML solution Take 10 g by mouth at bedtime as needed for mild constipation.     Multiple Vitamins-Minerals (MULTIVITAMIN WITH MINERALS) tablet Take 1 tablet by mouth daily.     NARCAN 4 MG/0.1ML LIQD nasal spray kit SMARTSIG:1 Both Nares Daily PRN     ondansetron (ZOFRAN) 4 MG tablet Take 4 mg by mouth every 8 (eight) hours as needed for nausea or vomiting.     prochlorperazine (COMPAZINE) 10 MG tablet Take 10 mg by mouth every 6 (six) hours as needed for nausea or vomiting.     traMADol (ULTRAM) 50 MG tablet  Take 50 mg by mouth every 6 (six) hours as needed.     triamcinolone (KENALOG) 0.025 % cream Apply 1 application topically 2 (two) times daily.     vitamin B-12 (CYANOCOBALAMIN) 100 MCG tablet Take 100 mcg by mouth daily.     No current facility-administered medications for this visit.     ASSESSMENT & PLAN:   Assessment: 1. Stage III renal cell carcinoma status post radical left nephrectomy.  Due to the stage and renal vein invasion, we recommended adjuvant therapy with sunitinib, which he could not tolerate due to severe uncontrolled hypertension.   2. Recurrent renal cell carcinoma with PET positive pulmonary nodules. He was treated with ipilimumab/nivolumab, but unfortunately had progressive disease, including bone metastasis. He has had difficulty tolerating oral chemotherapies. He was taking oral cabozantinib, but he eventually discontinued this due to progressive vitiligo.  He was placed on axitinib but did  not tolerate this due to toxicities of edema, shortness of breath, fatigue and hoarseness despite a dose reduction.  He is now on palliative everolimus 10 mg daily and is tolerating this without significant difficulty.  3. Destructive metastatic lesion in the right humeral head measuring 19 mm.  He received palliative radiation initially with better control of his pain. He is also receiving denosumab every 4 weeks in treatment of his bony metastasis.  He had recurrent severe right shoulder pain, which improved slightly with MS Contin 15 mg every 12 hours.  I will increase his MS Contin 30 mg every 12 hours. He will continue to use hydrocodone/APAP 10/325 as needed. I asked him to stop ibuprofen due to the rectal bleeding.  4. Worsening microcytic anemia with hemoglobin of 8.3, likely due to recurrent iron deficiency from chronic rectal bleeding. He has not had recent lab evaluation, so I will obtain iron studies, but also B12, folate,TSH,  LDH, haptoglobin and urinalysis.  5. Vitamin-D deficiency, he continues vitamin-D 3 2000 international units daily.  6. Hypertension, which has been very difficult to control mainly due to the targeted therapies.  This persists, but is not severe.  7. Mildly abnormal kidney function, in part due to dehydration, improved.    8. Episode of hemoptysis in late June.  CTA imaging revealed pulmonary metastases with progression from March 2021.  Apical segment right lower lobe nodule now measures 17 mm, previously 10 mm.  Mild ground-glass opacity around a right lower lobe nodule measuring 22 mm, previously 14 mm.  CT imaging from July also confirmed worsening disease.  He has not had further episodes of hemoptysis.  9. Splenic lesion measuring 5.6 x 3.0 cm, which is suspicious for metastatic disease.   10. Rectal bleeding, which he attributes to a hemorrhoid. I will treat him with Anusol HC suppositories and consult Dr. Lyda Jester for his  recommendations.  Plan:   I will plan to contact him with the results of his anemia workup. He may need IV iron replacement.  He will proceed with denosumab this week.  As above, I will increase his MS Contin to 30 mg twice daily, if he has difficulty tolerating this he will contact us.  We will plan to see him back in 4 weeks with a CBC and comprehensive metabolic panel for re-examination.  The patient and his wife understand the plans discussed today and are in agreement with them.  They know to contact our office if he develops symptoms of worsening anemia or other concerns prior to his next appointment.   I provided 30 minutes  of face-to-face time during this this encounter and > 50% was spent counseling as documented under my assessment and plan.      Thalia Bloodgood Baeleigh Devincent, PA-C     Addendum:  Iron studies were most consistent with anemia of chronic disease with a low iron and iron binding capacity and an elevated ferritin.  However, ferritin can be an acute phase reactant, so I will obtain a soluble transferrin to further evaluate for iron deficiency.  B12 and folate were normal.  TSH was normal.  There was no evidence of hemolysis. Urinalysis was negative for occult blood.

## 2020-07-29 NOTE — Telephone Encounter (Signed)
Per 3/1 LOS, patient scheduled for 3/28 Labs, Follow Up - 4/1 Injections.  Gave patient Appt Summary

## 2020-07-30 LAB — HAPTOGLOBIN: Haptoglobin: 366

## 2020-07-30 LAB — FOLATE: Folate: 10.8

## 2020-07-30 LAB — LACTATE DEHYDROGENASE: LDH: 302 — AB (ref 313–618)

## 2020-08-01 ENCOUNTER — Inpatient Hospital Stay: Payer: BC Managed Care – PPO

## 2020-08-01 ENCOUNTER — Other Ambulatory Visit: Payer: Self-pay

## 2020-08-01 VITALS — BP 162/99 | HR 98 | Temp 98.6°F | Resp 18 | Ht 70.0 in | Wt 220.8 lb

## 2020-08-01 DIAGNOSIS — E559 Vitamin D deficiency, unspecified: Secondary | ICD-10-CM | POA: Diagnosis not present

## 2020-08-01 DIAGNOSIS — C642 Malignant neoplasm of left kidney, except renal pelvis: Secondary | ICD-10-CM | POA: Diagnosis not present

## 2020-08-01 DIAGNOSIS — C7800 Secondary malignant neoplasm of unspecified lung: Secondary | ICD-10-CM | POA: Diagnosis not present

## 2020-08-01 DIAGNOSIS — C7951 Secondary malignant neoplasm of bone: Secondary | ICD-10-CM | POA: Diagnosis not present

## 2020-08-01 DIAGNOSIS — D509 Iron deficiency anemia, unspecified: Secondary | ICD-10-CM | POA: Diagnosis not present

## 2020-08-01 DIAGNOSIS — G893 Neoplasm related pain (acute) (chronic): Secondary | ICD-10-CM | POA: Diagnosis not present

## 2020-08-01 DIAGNOSIS — Z79899 Other long term (current) drug therapy: Secondary | ICD-10-CM | POA: Diagnosis not present

## 2020-08-01 DIAGNOSIS — I10 Essential (primary) hypertension: Secondary | ICD-10-CM | POA: Diagnosis not present

## 2020-08-01 MED ORDER — DENOSUMAB 120 MG/1.7ML ~~LOC~~ SOLN
120.0000 mg | Freq: Once | SUBCUTANEOUS | Status: AC
Start: 2020-08-01 — End: 2020-08-01
  Administered 2020-08-01: 120 mg via SUBCUTANEOUS

## 2020-08-01 MED ORDER — DENOSUMAB 120 MG/1.7ML ~~LOC~~ SOLN
SUBCUTANEOUS | Status: AC
  Filled 2020-08-01: qty 1.7

## 2020-08-01 NOTE — Patient Instructions (Signed)
Denosumab injection What is this medicine? DENOSUMAB (den oh sue mab) slows bone breakdown. Prolia is used to treat osteoporosis in women after menopause and in men, and in people who are taking corticosteroids for 6 months or more. Xgeva is used to treat a high calcium level due to cancer and to prevent bone fractures and other bone problems caused by multiple myeloma or cancer bone metastases. Xgeva is also used to treat giant cell tumor of the bone. This medicine may be used for other purposes; ask your health care provider or pharmacist if you have questions. COMMON BRAND NAME(S): Prolia, XGEVA What should I tell my health care provider before I take this medicine? They need to know if you have any of these conditions:  dental disease  having surgery or tooth extraction  infection  kidney disease  low levels of calcium or Vitamin D in the blood  malnutrition  on hemodialysis  skin conditions or sensitivity  thyroid or parathyroid disease  an unusual reaction to denosumab, other medicines, foods, dyes, or preservatives  pregnant or trying to get pregnant  breast-feeding How should I use this medicine? This medicine is for injection under the skin. It is given by a health care professional in a hospital or clinic setting. A special MedGuide will be given to you before each treatment. Be sure to read this information carefully each time. For Prolia, talk to your pediatrician regarding the use of this medicine in children. Special care may be needed. For Xgeva, talk to your pediatrician regarding the use of this medicine in children. While this drug may be prescribed for children as young as 13 years for selected conditions, precautions do apply. Overdosage: If you think you have taken too much of this medicine contact a poison control center or emergency room at once. NOTE: This medicine is only for you. Do not share this medicine with others. What if I miss a dose? It is  important not to miss your dose. Call your doctor or health care professional if you are unable to keep an appointment. What may interact with this medicine? Do not take this medicine with any of the following medications:  other medicines containing denosumab This medicine may also interact with the following medications:  medicines that lower your chance of fighting infection  steroid medicines like prednisone or cortisone This list may not describe all possible interactions. Give your health care provider a list of all the medicines, herbs, non-prescription drugs, or dietary supplements you use. Also tell them if you smoke, drink alcohol, or use illegal drugs. Some items may interact with your medicine. What should I watch for while using this medicine? Visit your doctor or health care professional for regular checks on your progress. Your doctor or health care professional may order blood tests and other tests to see how you are doing. Call your doctor or health care professional for advice if you get a fever, chills or sore throat, or other symptoms of a cold or flu. Do not treat yourself. This drug may decrease your body's ability to fight infection. Try to avoid being around people who are sick. You should make sure you get enough calcium and vitamin D while you are taking this medicine, unless your doctor tells you not to. Discuss the foods you eat and the vitamins you take with your health care professional. See your dentist regularly. Brush and floss your teeth as directed. Before you have any dental work done, tell your dentist you are   receiving this medicine. Do not become pregnant while taking this medicine or for 5 months after stopping it. Talk with your doctor or health care professional about your birth control options while taking this medicine. Women should inform their doctor if they wish to become pregnant or think they might be pregnant. There is a potential for serious side  effects to an unborn child. Talk to your health care professional or pharmacist for more information. What side effects may I notice from receiving this medicine? Side effects that you should report to your doctor or health care professional as soon as possible:  allergic reactions like skin rash, itching or hives, swelling of the face, lips, or tongue  bone pain  breathing problems  dizziness  jaw pain, especially after dental work  redness, blistering, peeling of the skin  signs and symptoms of infection like fever or chills; cough; sore throat; pain or trouble passing urine  signs of low calcium like fast heartbeat, muscle cramps or muscle pain; pain, tingling, numbness in the hands or feet; seizures  unusual bleeding or bruising  unusually weak or tired Side effects that usually do not require medical attention (report to your doctor or health care professional if they continue or are bothersome):  constipation  diarrhea  headache  joint pain  loss of appetite  muscle pain  runny nose  tiredness  upset stomach This list may not describe all possible side effects. Call your doctor for medical advice about side effects. You may report side effects to FDA at 1-800-FDA-1088. Where should I keep my medicine? This medicine is only given in a clinic, doctor's office, or other health care setting and will not be stored at home. NOTE: This sheet is a summary. It may not cover all possible information. If you have questions about this medicine, talk to your doctor, pharmacist, or health care provider.  2021 [employer] (2017-09-23 16:10:44)

## 2020-08-06 DIAGNOSIS — D500 Iron deficiency anemia secondary to blood loss (chronic): Secondary | ICD-10-CM | POA: Diagnosis not present

## 2020-08-07 ENCOUNTER — Other Ambulatory Visit: Payer: Self-pay | Admitting: Hematology and Oncology

## 2020-08-07 LAB — SOLUBLE TRANSFERRIN RECEPTOR: Transferrin Receptor: 14.8 (ref 12.2–27.3)

## 2020-08-11 DIAGNOSIS — M25511 Pain in right shoulder: Secondary | ICD-10-CM | POA: Diagnosis not present

## 2020-08-12 ENCOUNTER — Inpatient Hospital Stay: Payer: BC Managed Care – PPO

## 2020-08-12 ENCOUNTER — Other Ambulatory Visit: Payer: Self-pay

## 2020-08-12 VITALS — BP 132/87 | HR 101 | Temp 98.3°F | Resp 18 | Ht 70.0 in | Wt 213.5 lb

## 2020-08-12 DIAGNOSIS — E559 Vitamin D deficiency, unspecified: Secondary | ICD-10-CM | POA: Diagnosis not present

## 2020-08-12 DIAGNOSIS — C7951 Secondary malignant neoplasm of bone: Secondary | ICD-10-CM

## 2020-08-12 DIAGNOSIS — C642 Malignant neoplasm of left kidney, except renal pelvis: Secondary | ICD-10-CM | POA: Diagnosis not present

## 2020-08-12 DIAGNOSIS — C7800 Secondary malignant neoplasm of unspecified lung: Secondary | ICD-10-CM | POA: Diagnosis not present

## 2020-08-12 DIAGNOSIS — Z79899 Other long term (current) drug therapy: Secondary | ICD-10-CM | POA: Diagnosis not present

## 2020-08-12 DIAGNOSIS — G893 Neoplasm related pain (acute) (chronic): Secondary | ICD-10-CM | POA: Diagnosis not present

## 2020-08-12 DIAGNOSIS — D509 Iron deficiency anemia, unspecified: Secondary | ICD-10-CM | POA: Diagnosis not present

## 2020-08-12 DIAGNOSIS — I10 Essential (primary) hypertension: Secondary | ICD-10-CM | POA: Diagnosis not present

## 2020-08-12 MED ORDER — SODIUM CHLORIDE 0.9 % IV SOLN
Freq: Once | INTRAVENOUS | Status: AC
  Filled 2020-08-12: qty 250

## 2020-08-12 MED ORDER — SODIUM CHLORIDE 0.9 % IV SOLN
200.0000 mg | Freq: Once | INTRAVENOUS | Status: AC
  Administered 2020-08-12: 200 mg via INTRAVENOUS
  Filled 2020-08-12: qty 200

## 2020-08-12 NOTE — Progress Notes (Signed)
1500: PT STABLE AT TIME OF DISCHARGE  

## 2020-08-12 NOTE — Patient Instructions (Signed)

## 2020-08-14 ENCOUNTER — Inpatient Hospital Stay: Payer: BC Managed Care – PPO

## 2020-08-14 ENCOUNTER — Other Ambulatory Visit: Payer: Self-pay

## 2020-08-14 VITALS — BP 118/89 | HR 118 | Temp 98.8°F | Resp 18 | Ht 70.0 in | Wt 213.2 lb

## 2020-08-14 DIAGNOSIS — C7800 Secondary malignant neoplasm of unspecified lung: Secondary | ICD-10-CM | POA: Diagnosis not present

## 2020-08-14 DIAGNOSIS — Z79899 Other long term (current) drug therapy: Secondary | ICD-10-CM | POA: Diagnosis not present

## 2020-08-14 DIAGNOSIS — I10 Essential (primary) hypertension: Secondary | ICD-10-CM | POA: Diagnosis not present

## 2020-08-14 DIAGNOSIS — E559 Vitamin D deficiency, unspecified: Secondary | ICD-10-CM | POA: Diagnosis not present

## 2020-08-14 DIAGNOSIS — C642 Malignant neoplasm of left kidney, except renal pelvis: Secondary | ICD-10-CM | POA: Diagnosis not present

## 2020-08-14 DIAGNOSIS — G893 Neoplasm related pain (acute) (chronic): Secondary | ICD-10-CM | POA: Diagnosis not present

## 2020-08-14 DIAGNOSIS — D509 Iron deficiency anemia, unspecified: Secondary | ICD-10-CM | POA: Diagnosis not present

## 2020-08-14 DIAGNOSIS — C7951 Secondary malignant neoplasm of bone: Secondary | ICD-10-CM

## 2020-08-14 MED ORDER — SODIUM CHLORIDE 0.9 % IV SOLN
Freq: Once | INTRAVENOUS | Status: AC
  Filled 2020-08-14: qty 250

## 2020-08-14 MED ORDER — SODIUM CHLORIDE 0.9 % IV SOLN
200.0000 mg | Freq: Once | INTRAVENOUS | Status: AC
  Administered 2020-08-14: 200 mg via INTRAVENOUS
  Filled 2020-08-14: qty 200

## 2020-08-14 NOTE — Patient Instructions (Signed)

## 2020-08-14 NOTE — Progress Notes (Signed)
1457: PT STABLE AT TIME OF DISCHARGE

## 2020-08-15 DIAGNOSIS — K5730 Diverticulosis of large intestine without perforation or abscess without bleeding: Secondary | ICD-10-CM | POA: Diagnosis not present

## 2020-08-15 DIAGNOSIS — K635 Polyp of colon: Secondary | ICD-10-CM | POA: Diagnosis not present

## 2020-08-15 DIAGNOSIS — D509 Iron deficiency anemia, unspecified: Secondary | ICD-10-CM | POA: Diagnosis not present

## 2020-08-15 DIAGNOSIS — K648 Other hemorrhoids: Secondary | ICD-10-CM | POA: Diagnosis not present

## 2020-08-15 DIAGNOSIS — C649 Malignant neoplasm of unspecified kidney, except renal pelvis: Secondary | ICD-10-CM | POA: Diagnosis not present

## 2020-08-15 DIAGNOSIS — I10 Essential (primary) hypertension: Secondary | ICD-10-CM | POA: Diagnosis not present

## 2020-08-15 DIAGNOSIS — D500 Iron deficiency anemia secondary to blood loss (chronic): Secondary | ICD-10-CM | POA: Diagnosis not present

## 2020-08-15 DIAGNOSIS — D122 Benign neoplasm of ascending colon: Secondary | ICD-10-CM | POA: Diagnosis not present

## 2020-08-18 ENCOUNTER — Other Ambulatory Visit: Payer: Self-pay

## 2020-08-18 ENCOUNTER — Inpatient Hospital Stay: Payer: BC Managed Care – PPO

## 2020-08-18 VITALS — BP 117/78 | HR 97 | Temp 98.3°F | Resp 18 | Ht 70.0 in | Wt 216.8 lb

## 2020-08-18 DIAGNOSIS — E559 Vitamin D deficiency, unspecified: Secondary | ICD-10-CM | POA: Diagnosis not present

## 2020-08-18 DIAGNOSIS — I10 Essential (primary) hypertension: Secondary | ICD-10-CM | POA: Diagnosis not present

## 2020-08-18 DIAGNOSIS — D509 Iron deficiency anemia, unspecified: Secondary | ICD-10-CM | POA: Diagnosis not present

## 2020-08-18 DIAGNOSIS — G893 Neoplasm related pain (acute) (chronic): Secondary | ICD-10-CM | POA: Diagnosis not present

## 2020-08-18 DIAGNOSIS — C7800 Secondary malignant neoplasm of unspecified lung: Secondary | ICD-10-CM | POA: Diagnosis not present

## 2020-08-18 DIAGNOSIS — C642 Malignant neoplasm of left kidney, except renal pelvis: Secondary | ICD-10-CM | POA: Diagnosis not present

## 2020-08-18 DIAGNOSIS — C7951 Secondary malignant neoplasm of bone: Secondary | ICD-10-CM | POA: Diagnosis not present

## 2020-08-18 DIAGNOSIS — Z79899 Other long term (current) drug therapy: Secondary | ICD-10-CM | POA: Diagnosis not present

## 2020-08-18 MED ORDER — SODIUM CHLORIDE 0.9 % IV SOLN
200.0000 mg | Freq: Once | INTRAVENOUS | Status: AC
  Administered 2020-08-18: 200 mg via INTRAVENOUS
  Filled 2020-08-18: qty 200

## 2020-08-18 MED ORDER — SODIUM CHLORIDE 0.9 % IV SOLN
Freq: Once | INTRAVENOUS | Status: AC
  Filled 2020-08-18: qty 250

## 2020-08-18 NOTE — Patient Instructions (Signed)

## 2020-08-18 NOTE — Progress Notes (Signed)
1515: PT STABLE AT TIME OF DISCHARGE  

## 2020-08-19 ENCOUNTER — Inpatient Hospital Stay: Payer: BC Managed Care – PPO

## 2020-08-19 VITALS — BP 131/99 | HR 90 | Temp 98.7°F | Resp 18 | Ht 70.0 in | Wt 215.5 lb

## 2020-08-19 DIAGNOSIS — D509 Iron deficiency anemia, unspecified: Secondary | ICD-10-CM | POA: Diagnosis not present

## 2020-08-19 DIAGNOSIS — Z79899 Other long term (current) drug therapy: Secondary | ICD-10-CM | POA: Diagnosis not present

## 2020-08-19 DIAGNOSIS — I10 Essential (primary) hypertension: Secondary | ICD-10-CM | POA: Diagnosis not present

## 2020-08-19 DIAGNOSIS — E559 Vitamin D deficiency, unspecified: Secondary | ICD-10-CM | POA: Diagnosis not present

## 2020-08-19 DIAGNOSIS — C642 Malignant neoplasm of left kidney, except renal pelvis: Secondary | ICD-10-CM | POA: Diagnosis not present

## 2020-08-19 DIAGNOSIS — C7951 Secondary malignant neoplasm of bone: Secondary | ICD-10-CM | POA: Diagnosis not present

## 2020-08-19 DIAGNOSIS — G893 Neoplasm related pain (acute) (chronic): Secondary | ICD-10-CM | POA: Diagnosis not present

## 2020-08-19 DIAGNOSIS — C7800 Secondary malignant neoplasm of unspecified lung: Secondary | ICD-10-CM | POA: Diagnosis not present

## 2020-08-19 MED ORDER — ALBUTEROL SULFATE (2.5 MG/3ML) 0.083% IN NEBU
2.5000 mg | INHALATION_SOLUTION | Freq: Once | RESPIRATORY_TRACT | Status: DC | PRN
  Filled 2020-08-19: qty 3

## 2020-08-19 MED ORDER — SODIUM CHLORIDE 0.9% FLUSH
3.0000 mL | Freq: Once | INTRAVENOUS | Status: DC | PRN
  Filled 2020-08-19: qty 10

## 2020-08-19 MED ORDER — SODIUM CHLORIDE 0.9 % IV SOLN
Freq: Once | INTRAVENOUS | Status: AC
  Filled 2020-08-19: qty 250

## 2020-08-19 MED ORDER — HEPARIN SOD (PORK) LOCK FLUSH 100 UNIT/ML IV SOLN
500.0000 [IU] | Freq: Once | INTRAVENOUS | Status: DC | PRN
  Filled 2020-08-19: qty 5

## 2020-08-19 MED ORDER — SODIUM CHLORIDE 0.9 % IV SOLN
200.0000 mg | Freq: Once | INTRAVENOUS | Status: AC
  Administered 2020-08-19: 200 mg via INTRAVENOUS
  Filled 2020-08-19: qty 200

## 2020-08-19 MED ORDER — SODIUM CHLORIDE 0.9% FLUSH
10.0000 mL | Freq: Once | INTRAVENOUS | Status: DC | PRN
  Filled 2020-08-19: qty 10

## 2020-08-19 MED ORDER — METHYLPREDNISOLONE SODIUM SUCC 125 MG IJ SOLR: 125.0000 mg | Freq: Once | INTRAMUSCULAR | Status: DC | PRN

## 2020-08-19 MED ORDER — SODIUM CHLORIDE 0.9 % IV SOLN
Freq: Once | INTRAVENOUS | Status: DC | PRN
  Filled 2020-08-19: qty 250

## 2020-08-19 MED ORDER — EPINEPHRINE 0.3 MG/0.3ML IJ SOAJ: 0.3000 mg | Freq: Once | INTRAMUSCULAR | Status: DC | PRN

## 2020-08-19 MED ORDER — HEPARIN SOD (PORK) LOCK FLUSH 100 UNIT/ML IV SOLN
250.0000 [IU] | Freq: Once | INTRAVENOUS | Status: DC | PRN
  Filled 2020-08-19: qty 5

## 2020-08-19 MED ORDER — DIPHENHYDRAMINE HCL 50 MG/ML IJ SOLN: 50.0000 mg | Freq: Once | INTRAMUSCULAR | Status: DC | PRN

## 2020-08-19 MED ORDER — ALTEPLASE 2 MG IJ SOLR
2.0000 mg | Freq: Once | INTRAMUSCULAR | Status: DC | PRN
  Filled 2020-08-19: qty 2

## 2020-08-19 MED ORDER — FAMOTIDINE IN NACL 20-0.9 MG/50ML-% IV SOLN: 20.0000 mg | Freq: Once | INTRAVENOUS | Status: DC | PRN

## 2020-08-19 NOTE — Progress Notes (Signed)
1445: PT STABLE AT TIME OF DISCHARGE  

## 2020-08-20 ENCOUNTER — Inpatient Hospital Stay: Payer: BC Managed Care – PPO

## 2020-08-20 ENCOUNTER — Other Ambulatory Visit: Payer: Self-pay

## 2020-08-20 VITALS — BP 126/77 | HR 78 | Temp 98.0°F | Resp 18 | Ht 70.0 in | Wt 219.1 lb

## 2020-08-20 DIAGNOSIS — C7951 Secondary malignant neoplasm of bone: Secondary | ICD-10-CM

## 2020-08-20 DIAGNOSIS — C7800 Secondary malignant neoplasm of unspecified lung: Secondary | ICD-10-CM | POA: Diagnosis not present

## 2020-08-20 DIAGNOSIS — C642 Malignant neoplasm of left kidney, except renal pelvis: Secondary | ICD-10-CM | POA: Diagnosis not present

## 2020-08-20 DIAGNOSIS — I10 Essential (primary) hypertension: Secondary | ICD-10-CM | POA: Diagnosis not present

## 2020-08-20 DIAGNOSIS — E559 Vitamin D deficiency, unspecified: Secondary | ICD-10-CM | POA: Diagnosis not present

## 2020-08-20 DIAGNOSIS — G893 Neoplasm related pain (acute) (chronic): Secondary | ICD-10-CM | POA: Diagnosis not present

## 2020-08-20 DIAGNOSIS — D509 Iron deficiency anemia, unspecified: Secondary | ICD-10-CM | POA: Diagnosis not present

## 2020-08-20 DIAGNOSIS — Z79899 Other long term (current) drug therapy: Secondary | ICD-10-CM | POA: Diagnosis not present

## 2020-08-20 MED ORDER — SODIUM CHLORIDE 0.9 % IV SOLN
Freq: Once | INTRAVENOUS | Status: AC
  Filled 2020-08-20: qty 250

## 2020-08-20 MED ORDER — SODIUM CHLORIDE 0.9 % IV SOLN
200.0000 mg | Freq: Once | INTRAVENOUS | Status: AC
  Administered 2020-08-20: 200 mg via INTRAVENOUS
  Filled 2020-08-20: qty 200

## 2020-08-20 NOTE — Patient Instructions (Signed)

## 2020-08-22 ENCOUNTER — Telehealth: Payer: Self-pay

## 2020-08-22 NOTE — Progress Notes (Signed)
[facility]  [address] [place],  [place]  [address] [phone]  Clinic Day:  08/25/2020  Referring physician: Lind Guest, NP  This document serves as a record of services personally performed by [name], MD. It was created on their behalf by [name],[name], a trained medical scribe. The creation of this record is based on the scribe's personal observations and the provider's statements to them.  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  Current Treatment:    Palliative everolimus 10 mg daily.   HISTORY OF PRESENT ILLNESS:  Steven Peck is a 55 y.o. 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  We began seeing him in May 2020 when he was referred by [name], NP, for a left renal mass and iron deficiency anemia.  He had transient hematuria, which later resolved, and weight loss.  In early May 2020, his hemoglobin was 11.3 with an MCV of 75. PSA was normal.  He had hypercalcemia with a calcium of 12.8.  Repeat labs a few days later revealed persistent hypercalcemia with a calcium of 13.  PTH was low at 6. Vitamin-D was low at 17.1.  He was started on vitamin D supplementation.  His hemoglobin continued to decrease to 10.8.  Serum protein electrophoresis was negative for a monoclonal spike but his gamma globulins were elevated at 4.4.  A random urine protein electrophoresis was also negative for monoclonal spike.  He was given hydrocodone/APAP 7.5/325 every 6 hours as needed for pain.  CT abdomen and pelvis revealed marked generalized swelling of the left kidney with hyperdensity throughout the renal collecting system, renal pelvis, ureter and posterior bladder consistent with gross hemorrhage with a 6-7 cm infiltrating mass in the upper pole of the left kidney.  There was no evidence of venous invasion.  There were abnormal nodes in the  retroperitoneum medial to the left kidney, up to 1.5 cm in diameter.  Repeat calcium in our office at the end of May was down to 11.6.  When we saw him in early June, his hemoglobin has dropped from 10 to 9.5.  B12 was low normal and he was placed on oral vitamin B12 1000 mcg daily.  His calcium had increased to 13, so he was given IV fluids and IV zoledronic acid 4 mg.  He reported persistent pain in his left flank and left upper inner thigh, which he continue to rate 10/10, despite the hydrocodone/APAP.  We switched him to oxycodone 10 mg every 4 hours as needed.  PET scan revealed malignancy of the kidney, but no definite evidence of metastasis.  There were two right pulmonary nodules measuring 3 mm.  MRI brain did not reveal any intracranial metastasis.  He underwent biopsy of the left kidney mass in June which revealed renal cell carcinoma.  He underwent left radical nephrectomy in August. 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  11 lymph nodes were negative for metastasis.  Tumor extended into the renal vein, but margins were clear.  He received IV iron in the form of Feraheme in July 2020.  Due to his high risk of recurrence, he was recommended for adjuvant oral chemotherapy with sunitinib 50 mg daily for 2 weeks on and 1 week off for total of 1 year.  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  He was evaluated in the emergency department and placed on clonidine 0.2 mg 3 times daily.  The hypertension was uncontrolled with this as well as other medications.  Sunitinib was therefore discontinued after only 2 weeks of therapy.  He saw Dr. [name] for routine follow-up in February 2021.  At that time, CT abdomen and pelvis revealed basilar pulmonary nodules, which had increased in size and number, and were felt to be highly suspicious for metastatic disease.  There was a persistent splenic lesion, once again felt to  be benign.  He was referred back to [place].  He had worsening anemia and iron panel and ferritin were equivocal, but a soluble transferrin was normal, so inconsistent with iron deficiency. PET scan revealed several of the pulmonary nodules to be hypermetabolic, consistent with metastatic disease.  There was no evidence of recurrent or metastatic disease within the abdomen and pelvis.  We recommended palliative immunotherapy with ipilimumab/nivolumab for 4 cycles to be followed by maintenance nivolumab.  He experienced severe pain of his right shoulder and down his right arm with associated swelling.  He is unable to fully lift his arm, and his right neck is also swollen. Ultrasound was negative for thrombosis.  His Port-A-Cath was then removed.  He was seen in clinic at the end of the day on July 1st., after he had been seen in the emergency department earlier in the day because of mild hemoptysis.  CTA chest with contrast revealed pulmonary metastases with mild progression from March 2021.  The apical segment right lower lobe nodule was 17 mm, previously 10 mm.  Mild ground-glass opacity around a right lower lobe nodule measuring 22 mm, previously 14 mm.  It was unclear if this represents pseudoprogression or true progression.  He had persistentsevere right shoulder pain, despite tramadol.  Plain films of the right shoulderIn July revealed a large lucent lesion in the right humeral head with cortical destruction, as well as moderate degenerative changes of the acromioclavicular and glenohumeral joints.  CT chest, abdomen and pelvis in July revealed a destructive metastatic lesion of the right proximal humerus measuring 19 mm.  Numerous pulmonary nodules were again observed, similar to June, but new and enlarging nodules overall since March 2021.  The splenic lesion measuring 5.6 x 3.0 cm, previously 4.7 x 5.0 cm, was indeterminate, but suspicious for metastasis despite lack of FDG uptake, as it has enlarged since  previous imaging.  Nivolumab was discontinued.  He saw Dr. [name] and received palliative radiation to the right humeral head lesion in July.   He was placed on denosumab every 4 weeks for the bone metastasis.  Due to his progressive disease he was switched to palliative oral chemotherapy with cabozantinib 60 mg daily in July. Cabozantinib had to be held briefly due to uncontrolled hypertension.  He also had hypopigmentation of the perioral area and upper  back.  He discontinued tramadol as this was causing severe headaches and was using hydrocodone/APAP as needed for pain, but he did not tolerate that either.  He resumed cabozantinib in August once his blood pressure was controlled.  He had mild hyperkalemia at that time, so we increased his HCTZ to 2 tabs daily.  He had persistent right shoulder pain, for which he was using Tylenol, then Advil.  We added mirtazapine 15 mg at bedtime for insomnia in September.  He discontinued cabozantinib in September due to progressive vitiligo.  We recommended alternative therapy with axitinib 5 mg twice daily.  He has continueed denosumab  every 4 weeks.  Repeat CT chest, abdomen and pelvis in September prior to starting axitinib revealed a decrease in the pulmonary nodules, without evidence of new metastatic disease in the lungs or abdomen and pelvis.  He was found to have an elevated uric acid and findings consistent with gouty arthritis, which he claims he has dealt with in the past.  He had symptoms of shortness of breath, fatigue and hoarseness, as well as a single episode of hemoptysis, so he stopped his axitinib on his own. Repeat CT imaging  in November revealed mild interval progression of bilateral pulmonary metastases, and slight interval increase in size of bilateral external iliac lymph nodes although they remain within normal limits for size.  No evidence for recurrent in the nephrectomy bed was seen.  We resumed axitinib at a lower dose of 3 mg twice daily but  unfortunately, he experienced the same side effects, including shortness of breath and edema. CTA chest in late November was read as a very limited study due to respiratory motion artifact. No large or central pulmonary artery embolus identified. Known pulmonary metastatic disease and aortic atherosclerosis.  Axitinib was discontinued. He eventually resumed hydrocodone/APAP 10/325 at bedtime.  We recommended alternative treatment with everolimus 10 mg daily which he started on January 15th. At his visit on February 1st, he was tolerating everolimus fairly well, except for pruritus.  He was given triamcinolone cream.  He had continued right shoulder pain, which was waking him up at night despite hydrocodone /APAP and Advil at bedtime, so we started him on MS Contin 15 mg every 12 hours.  We tried increasing the dose to 30 mg but he felt this did not give him adequate relief and he has stopped this.  His anemia has been steadily worsening, and evaluation at his last visit revealed a normal B12 and folate, no evidence of hemolysis, but equivocal results on his iron studies.  He was given IV iron in March.    INTERVAL HISTORY:  Steven Peck is here for routine follow up and continues everolimus without difficulty.  He continues to have pain of the right shoulder which he rates as a 9/10 and has been using hydrocodone 10 mg as needed with mild relief as well as Advil during the day.  His shoulder pain has caused insomnia.  He asks about possible surgery, and I could refer him to an orthopedic surgeon if he wishes to discuss this in consultation.  We will plan to obtain x-rays of the shoulder next week as well as CT chest, abdomen and pelvis to restage him now that he has had nearly 3 months of therapy.  We may need to send him to an orthopedic oncologist and likely they will recommend an MRI of the shoulder.  He has mild fatigue.  His hemoglobin has decreased from 8.3 to 7.2 with an MCV of 70, and his white count and  platelets are normal.  Chemistries are unremarkable except for a sodium of 133.  His  appetite is good, and his weight is stable since his last visit.  He denies fever, chills or other signs of infection.  He denies nausea, vomiting, bowel issues, or abdominal pain.  He denies sore throat, cough, dyspnea, or chest pain.  REVIEW OF SYSTEMS:  Review of Systems  Constitutional: Positive for fatigue (mild). Negative for appetite change, chills, fever and unexpected weight change.  HENT:   Positive for mouth sores (intermittent).   Eyes: Negative.   Respiratory: Negative.  Negative  for chest tightness, cough, hemoptysis, shortness of breath and wheezing.   Cardiovascular: Negative.  Negative for chest pain, leg swelling and palpitations.  Gastrointestinal: Negative.  Negative for abdominal distention, abdominal pain, blood in stool, constipation, diarrhea, nausea and vomiting.  Endocrine: Negative.   Genitourinary: Negative.  Negative for difficulty urinating, dysuria, frequency and hematuria.   Musculoskeletal: Negative for arthralgias, back pain, flank pain, gait problem and myalgias.       Persistent pain of the right shoulder  Skin: Negative.   Neurological: Negative.  Negative for dizziness, extremity weakness, gait problem, headaches, light-headedness, numbness, seizures and speech difficulty.  Hematological: Negative.   Psychiatric/Behavioral: Positive for sleep disturbance (due to pain of the shoulder). Negative for depression. The patient is not nervous/anxious.   All other systems reviewed and are negative.    VITALS:  Blood pressure (!) 146/85, pulse 97, temperature 98.2 F (36.8 C), temperature source Oral, resp. rate 18, height _0  (1.778 m), weight 220 lb 12.8 oz (100.2 kg), SpO2 100 %.  Wt Readings from Last 3 Encounters:  08/25/20 220 lb 12.8 oz (100.2 kg)  08/20/20 219 lb 0.8 oz (99.4 kg)  08/19/20 215 lb 8 oz (97.8 kg)    Body mass index is 31.68 kg/m.  Performance  status (ECOG): 1 - Symptomatic but completely ambulatory  PHYSICAL EXAM:  Physical Exam Constitutional:      General: He is not in acute distress.    Appearance: Normal appearance. He is normal weight.  HENT:     Head: Normocephalic and atraumatic.  Eyes:     General: No scleral icterus.    Extraocular Movements: Extraocular movements intact.     Conjunctiva/sclera: Conjunctivae normal.     Pupils: Pupils are equal, round, and reactive to light.  Cardiovascular:     Rate and Rhythm: Normal rate and regular rhythm.     Pulses: Normal pulses.     Heart sounds: Normal heart sounds. No murmur heard. No friction rub. No gallop.   Pulmonary:     Effort: Pulmonary effort is normal. No respiratory distress.     Breath sounds: Normal breath sounds.  Abdominal:     General: Bowel sounds are normal. There is no distension.     Palpations: Abdomen is soft. There is no hepatomegaly, splenomegaly or mass.     Tenderness: There is no abdominal tenderness.  Musculoskeletal:        General: Normal range of motion.     Cervical back: Normal range of motion and neck supple.     Right lower leg: No edema.     Left lower leg: No edema.  Lymphadenopathy:     Cervical: No cervical adenopathy.  Skin:    General: Skin is warm and dry.  Neurological:     General: No focal deficit present.     Mental Status: He is alert and oriented to person, place, and time. Mental status is at baseline.  Psychiatric:        Mood and Affect: Mood normal.        Behavior: Behavior normal.        Thought Content: Thought content normal.        Judgment: Judgment normal.    LABS:   CBC Latest Ref Rng & Units 08/25/2020 07/29/2020 07/01/2020  WBC - 4.8 4.8 4.6  Hemoglobin 13.5 - 17.5 7.2(A) 8.3(A) 9.8(A)  Hematocrit 41 - 53 24(A) 27(A) 32(A)  Platelets 150 - 399 353 271 213   CMP Latest Ref Rng &  Units 08/25/2020 07/29/2020 07/01/2020  Glucose 70 - 99 mg/dL - - -  BUN 4 - _0 25(A)  Creatinine 0.6 - 1.3 1.0 1.1  1.2  Sodium 137 - 147 133(A) 136(A) 137  Potassium 3.4 - 5.3 4.0 4.5 4.7  Chloride 99 - 108 105 106 103  CO2 13 - 22 22 24(A) 28(A)  Calcium 8.7 - 10.7 8.4(A) 8.7 9.8  Alkaline Phos 25 - 125 92 85 92  AST 14 - 40 _1 ALT 10 - 40 _2 Lab Results  Component Value Date   TIBC 234 07/29/2020   FERRITIN 681 07/29/2020   IRONPCTSAT TNP 07/29/2020   Lab Results  Component Value Date   LDH 302 (A) 07/29/2020    STUDIES:  No results found.    HISTORY:   Allergies:  Allergies  Allergen Reactions  . Hydrochlorothiazide Swelling  . Lisinopril Swelling    Angioedema of lips  . Tramadol     Current Medications: Current Outpatient Medications  Medication Sig Dispense Refill  . allopurinol (ZYLOPRIM) 100 MG tablet Take 100 mg by mouth daily.    . ANUCORT-HC 25 MG suppository Place 25 mg rectally 2 (two) times daily as needed.    . calcium citrate-vitamin D (CITRACAL+D) 315-200 MG-UNIT tablet Take 1 tablet by mouth 2 (two) times daily.    . Denosumab (XGEVA [place]) Inject into the skin.    Marland Kitchen everolimus (AFINITOR) 10 MG tablet Take 10 mg by mouth daily.    Marland Kitchen HYDROcodone-acetaminophen (NORCO) 10-325 MG tablet Take 1 tablet by mouth every 4 (four) hours as needed (for pain). 120 tablet 0  . hydrocortisone (ANUSOL-HC) 25 MG suppository Place 1 suppository (25 mg total) rectally 2 (two) times daily. 12 suppository 0  . hydrocortisone cream 1 % Apply 1 application topically 2 (two) times daily.    Marland Kitchen lactulose (CHRONULAC) 10 GM/15ML solution Take 10 g by mouth at bedtime as needed for mild constipation.    Marland Kitchen morphine (MS CONTIN) 30 MG 12 hr tablet Take 1 tablet (30 mg total) by mouth every 12 (twelve) hours. 60 tablet 0  . Multiple Vitamins-Minerals (MULTIVITAMIN WITH MINERALS) tablet Take 1 tablet by mouth daily.    Marland Kitchen NARCAN 4 MG/0.1ML LIQD nasal spray kit SMARTSIG:1 Both Nares Daily PRN    . olmesartan (BENICAR) 20 MG tablet TAKE 1 TABLET BY MOUTH ONCE DAILY FOR 90 DAYS     . ondansetron (ZOFRAN) 4 MG tablet Take 4 mg by mouth every 8 (eight) hours as needed for nausea or vomiting.    . prochlorperazine (COMPAZINE) 10 MG tablet Take 10 mg by mouth every 6 (six) hours as needed for nausea or vomiting.    . traMADol (ULTRAM) 50 MG tablet Take 50 mg by mouth every 6 (six) hours as needed.    . triamcinolone (KENALOG) 0.025 % cream Apply 1 application topically 2 (two) times daily.    . vitamin B-12 (CYANOCOBALAMIN) 100 MCG tablet Take 100 mcg by mouth daily.     No current facility-administered medications for this visit.     ASSESSMENT & PLAN:   Assessment: 1. Stage III renal cell carcinoma status post radical left nephrectomy.  Due to the stage and renal vein invasion, we recommended adjuvant therapy with sunitinib, which he could not tolerate due to severe uncontrolled hypertension.   2. Recurrent renal cell carcinoma with PET positive pulmonary nodules. He was treated with ipilimumab/nivolumab, but unfortunately had progressive  disease, including bone metastasis. He has had difficulty tolerating oral chemotherapies. He was taking oral cabozantinib, but he eventually discontinued this due to progressive vitiligo.  He was placed on axitinib but did not tolerate this due to toxicities of edema, shortness of breath, fatigue and hoarseness despite a dose reduction.  He is now on palliative everolimus 10 mg daily and is tolerating this without significant difficulty.  3. Destructive metastatic lesion in the right humeral head measuring 19 mm.  He received palliative radiation initially with better control of his pain. He is also receiving denosumab every 4 weeks in treatment of his bony metastasis.  He had recurrent severe right shoulder pain, which improved slightly with MS Contin 15 mg every 12 hours.  I increased the MS Contin to 30 mg every 12 hours, but he did not have much relief and he is no longer taking this.  He will continue to use hydrocodone/APAP 10/325 as  needed.  We will plan to evaluate this further with x-rays and probably MRI scan.  He may need referral to an orthopedic oncologist.  4. Worsening microcytic anemia with hemoglobin of 7.2, likely due to recurrent iron deficiency from chronic rectal bleeding. Iron studies were most consistent with anemia of chronic disease with a low iron and iron binding capacity and an elevated ferritin so eventually he still needs a GI workup.  5. Vitamin-D deficiency, he continues vitamin-D 3 2000 international units daily.  6. Hypertension, which has been very difficult to control mainly due to the targeted therapies.  This persists, but is not severe.  7. Episode of hemoptysis in late June.  CTA imaging revealed pulmonary metastases with progression from March 2021.  Apical segment right lower lobe nodule now measures 17 mm, previously 10 mm.  Mild ground-glass opacity around a right lower lobe nodule measuring 22 mm, previously 14 mm.  CT imaging from July also confirmed worsening disease.  He has not had further episodes of hemoptysis.  8. Splenic lesion measuring 5.6 x 3.0 cm, which is suspicious for metastatic disease.   9.  Rectal bleeding, which he attributes to a hemorrhoid. I will treat him with Anusol HC suppositories and consult Dr. Lyda Jester for his recommendations.  Plan:    He continues to have significant right shoulder pain and asks about possible surgery.  I will obtain x-rays and can refer him to an orthopedic surgeon to discuss this further in consultation if he wishes.  I will also discuss this with Dr. [name] to see if he qualifies for further palliative radiation.  His hemoglobin continues to drop now at 7.2, but he wishes to hold off on a transfusion for now.  If his hemoglobin drops any lower, will need to consider transfusion.  We will plan to see him back next week with a CBC, comprehensive metabolic panel, soluble transferrin, type and cross and CT chest, abdomen and pelvis for  re-examination.  The patient and his significant other understand the plans discussed today and are in agreement with them.  They know to contact our office if he develops symptoms of worsening anemia or other concerns prior to his next appointment.   I provided 30 minutes of face-to-face time during this this encounter and > 50% was spent counseling as documented under my assessment and plan.     I, Rita Ohara, am acting as scribe for Derwood Kaplan, MD  I have reviewed this report as typed by the medical scribe, and it is complete and accurate.  Hermina Barters

## 2020-08-22 NOTE — Telephone Encounter (Addendum)
I spoke with pt regarding his Afinitor 10mg  po qd. He continues to take the Afinitor @ night with food. No missed doses. He denies N/V, skin rashes, diarrhea and fevers. He recently had a colonoscopy and upper endoscopy. He is being treated for H pylori. They removed 2 small colon polyps. He had a nose bleed this morning that lasted 10-15 min. Denies dizziness/lightheadiness. SOB baseline on exertion. No SOB @ rest nor lying. Pt educated that if he cant get his nose to stop bleeding within 30-45 min, he needs to call us for instruction, as his hgb is in 7's now. Pt reminded to pinch his nose as hard as possible,  & keep his head in downward position to stop nose bleed. Applying cold pack is helpful as well to slow nose bleeds. Pt reminded to call us if temp over 100.4, day or night.  Pt verbalized understanding.  Pt was in clinic to see Dr Hinton Rao on 08/25/20. Dr Remi Deter progress notes stages he continues to tolerate the Afinitor without difficulty. Pt works until late afternoon. I haven't been able to get in touch with him.

## 2020-08-25 ENCOUNTER — Inpatient Hospital Stay (INDEPENDENT_AMBULATORY_CARE_PROVIDER_SITE_OTHER): Payer: BC Managed Care – PPO | Admitting: Oncology

## 2020-08-25 ENCOUNTER — Encounter: Payer: Self-pay | Admitting: Oncology

## 2020-08-25 ENCOUNTER — Other Ambulatory Visit: Payer: Self-pay | Admitting: Oncology

## 2020-08-25 ENCOUNTER — Inpatient Hospital Stay: Payer: BC Managed Care – PPO

## 2020-08-25 ENCOUNTER — Other Ambulatory Visit: Payer: Self-pay

## 2020-08-25 ENCOUNTER — Other Ambulatory Visit: Payer: Self-pay | Admitting: Hematology and Oncology

## 2020-08-25 VITALS — BP 146/85 | HR 97 | Temp 98.2°F | Resp 18 | Ht 70.0 in | Wt 220.8 lb

## 2020-08-25 DIAGNOSIS — C7951 Secondary malignant neoplasm of bone: Secondary | ICD-10-CM

## 2020-08-25 DIAGNOSIS — R911 Solitary pulmonary nodule: Secondary | ICD-10-CM

## 2020-08-25 DIAGNOSIS — C7801 Secondary malignant neoplasm of right lung: Secondary | ICD-10-CM

## 2020-08-25 DIAGNOSIS — C642 Malignant neoplasm of left kidney, except renal pelvis: Secondary | ICD-10-CM | POA: Diagnosis not present

## 2020-08-25 DIAGNOSIS — C7802 Secondary malignant neoplasm of left lung: Secondary | ICD-10-CM

## 2020-08-25 LAB — COMPREHENSIVE METABOLIC PANEL
Albumin: 3.9 (ref 3.5–5.0)
Calcium: 8.4 — AB (ref 8.7–10.7)

## 2020-08-25 LAB — HEPATIC FUNCTION PANEL
ALT: 15 (ref 10–40)
AST: 23 (ref 14–40)
Alkaline Phosphatase: 92 (ref 25–125)
Bilirubin, Total: 0.6

## 2020-08-25 LAB — CBC
MCV: 70 — AB (ref 80–94)
RBC: 3.39 — AB (ref 3.87–5.11)

## 2020-08-25 LAB — BASIC METABOLIC PANEL
BUN: 15 (ref 4–21)
CO2: 22 (ref 13–22)
Chloride: 105 (ref 99–108)
Creatinine: 1 (ref 0.6–1.3)
Glucose: 92
Potassium: 4 (ref 3.4–5.3)
Sodium: 133 — AB (ref 137–147)

## 2020-08-25 LAB — CBC AND DIFFERENTIAL
HCT: 24 — AB (ref 41–53)
Hemoglobin: 7.2 — AB (ref 13.5–17.5)
Neutrophils Absolute: 2.74
Platelets: 353 (ref 150–399)
WBC: 4.8

## 2020-08-29 ENCOUNTER — Inpatient Hospital Stay: Payer: BC Managed Care – PPO

## 2020-09-02 ENCOUNTER — Encounter: Payer: Self-pay | Admitting: Oncology

## 2020-09-02 DIAGNOSIS — C642 Malignant neoplasm of left kidney, except renal pelvis: Secondary | ICD-10-CM | POA: Diagnosis not present

## 2020-09-02 DIAGNOSIS — C7800 Secondary malignant neoplasm of unspecified lung: Secondary | ICD-10-CM | POA: Diagnosis not present

## 2020-09-02 DIAGNOSIS — Z85528 Personal history of other malignant neoplasm of kidney: Secondary | ICD-10-CM | POA: Diagnosis not present

## 2020-09-02 DIAGNOSIS — C649 Malignant neoplasm of unspecified kidney, except renal pelvis: Secondary | ICD-10-CM | POA: Diagnosis not present

## 2020-09-02 DIAGNOSIS — M79621 Pain in right upper arm: Secondary | ICD-10-CM | POA: Diagnosis not present

## 2020-09-02 DIAGNOSIS — I2510 Atherosclerotic heart disease of native coronary artery without angina pectoris: Secondary | ICD-10-CM | POA: Diagnosis not present

## 2020-09-02 DIAGNOSIS — M778 Other enthesopathies, not elsewhere classified: Secondary | ICD-10-CM | POA: Diagnosis not present

## 2020-09-02 DIAGNOSIS — E278 Other specified disorders of adrenal gland: Secondary | ICD-10-CM | POA: Diagnosis not present

## 2020-09-02 DIAGNOSIS — Z85118 Personal history of other malignant neoplasm of bronchus and lung: Secondary | ICD-10-CM | POA: Diagnosis not present

## 2020-09-02 DIAGNOSIS — C7951 Secondary malignant neoplasm of bone: Secondary | ICD-10-CM | POA: Diagnosis not present

## 2020-09-04 ENCOUNTER — Other Ambulatory Visit: Payer: Self-pay | Admitting: Hematology and Oncology

## 2020-09-04 ENCOUNTER — Other Ambulatory Visit: Payer: Self-pay | Admitting: Oncology

## 2020-09-04 ENCOUNTER — Other Ambulatory Visit: Payer: Self-pay

## 2020-09-04 ENCOUNTER — Encounter: Payer: Self-pay | Admitting: Oncology

## 2020-09-04 ENCOUNTER — Inpatient Hospital Stay: Payer: BC Managed Care – PPO | Attending: Oncology | Admitting: Oncology

## 2020-09-04 ENCOUNTER — Telehealth: Payer: Self-pay | Admitting: Oncology

## 2020-09-04 VITALS — BP 149/84 | HR 101 | Temp 98.1°F | Resp 18 | Ht 70.0 in | Wt 212.7 lb

## 2020-09-04 DIAGNOSIS — C7802 Secondary malignant neoplasm of left lung: Secondary | ICD-10-CM

## 2020-09-04 DIAGNOSIS — C7801 Secondary malignant neoplasm of right lung: Secondary | ICD-10-CM | POA: Diagnosis not present

## 2020-09-04 DIAGNOSIS — C642 Malignant neoplasm of left kidney, except renal pelvis: Secondary | ICD-10-CM

## 2020-09-04 DIAGNOSIS — C7951 Secondary malignant neoplasm of bone: Secondary | ICD-10-CM

## 2020-09-04 LAB — CBC AND DIFFERENTIAL
HCT: 25 — AB (ref 41–53)
Hemoglobin: 7.5 — AB (ref 13.5–17.5)
Neutrophils Absolute: 2.99
Platelets: 359 (ref 150–399)
WBC: 4.9

## 2020-09-04 LAB — BASIC METABOLIC PANEL
BUN: 14 (ref 4–21)
CO2: 20 (ref 13–22)
Chloride: 105 (ref 99–108)
Creatinine: 1.1 (ref 0.6–1.3)
Glucose: 105
Potassium: 4 (ref 3.4–5.3)
Sodium: 136 — AB (ref 137–147)

## 2020-09-04 LAB — HEPATIC FUNCTION PANEL
ALT: 15 (ref 10–40)
AST: 21 (ref 14–40)
Alkaline Phosphatase: 100 (ref 25–125)
Bilirubin, Total: 0.6

## 2020-09-04 LAB — CBC
MCV: 69 — AB (ref 80–94)
RBC: 3.61 — AB (ref 3.87–5.11)

## 2020-09-04 LAB — COMPREHENSIVE METABOLIC PANEL
Albumin: 4.2 (ref 3.5–5.0)
Calcium: 8.3 — AB (ref 8.7–10.7)

## 2020-09-04 MED ORDER — GABAPENTIN 300 MG PO CAPS: 300.0000 mg | ORAL_CAPSULE | Freq: Every day | ORAL | 0 refills | Status: DC

## 2020-09-04 NOTE — Telephone Encounter (Signed)
Per 4/7 LOS, patient scheduled for May Appt's.  Gave patient Appt Summary 

## 2020-09-04 NOTE — Progress Notes (Signed)
Steven Peck  [address] [place],  [place]  [address] [phone]  Clinic Day:  09/04/2020  Referring physician: [name], NP  This document serves as a record of services personally performed by [name], MD. It was created on their behalf by [name],[name], a trained medical scribe. The creation of this record is based on the scribe's personal observations and the provider's statements to them.  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  Current Treatment:    Palliative everolimus 10 mg daily.   HISTORY OF PRESENT ILLNESS:  Steven Peck is a 55 y.o. 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  We began seeing him in May 2020 when he was referred by [name], NP, for a left renal mass and iron deficiency anemia.  He had transient hematuria, which later resolved, and weight loss.  In early May 2020, his hemoglobin was 11.3 with an MCV of 75. PSA was normal.  He had hypercalcemia with a calcium of 12.8.  Repeat labs a few days later revealed persistent hypercalcemia with a calcium of 13.  PTH was low at 6. Vitamin-D was low at 17.1.  He was started on vitamin D supplementation.  His hemoglobin continued to decrease to 10.8.  Serum protein electrophoresis was negative for a monoclonal spike but his gamma globulins were elevated at 4.4.  A random urine protein electrophoresis was also negative for monoclonal spike.  He was given hydrocodone/APAP 7.5/325 every 6 hours as needed for pain.  CT abdomen and pelvis revealed marked generalized swelling of the left kidney with hyperdensity throughout the renal collecting system, renal pelvis, ureter and posterior bladder consistent with gross hemorrhage with a 6-7 cm infiltrating mass in the upper pole of the left kidney.  There was no evidence of venous invasion.  There were abnormal nodes in the  retroperitoneum medial to the left kidney, up to 1.5 cm in diameter.  Repeat calcium in our office at the end of May was down to 11.6.  When we saw him in early June, his hemoglobin has dropped from 10 to 9.5.  B12 was low normal and he was placed on oral vitamin B12 1000 mcg daily.  His calcium had increased to 13, so he was given IV fluids and IV zoledronic acid 4 mg.  He reported persistent pain in his left flank and left upper inner thigh, which he continue to rate 10/10, despite the hydrocodone/APAP.  We switched him to oxycodone 10 mg every 4 hours as needed.  PET scan revealed malignancy of the kidney, but no definite evidence of metastasis.  There were two right pulmonary nodules measuring 3 mm.  MRI brain did not reveal any intracranial metastasis.  He underwent biopsy of the left kidney mass in June which revealed renal cell carcinoma.  He underwent left radical nephrectomy in August. 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  11 lymph nodes were negative for metastasis.  Tumor extended into the renal vein, but margins were clear.  He received IV iron in the form of Feraheme in July 2020.  Due to his high risk of recurrence, he was recommended for adjuvant oral chemotherapy with sunitinib 50 mg daily for 2 weeks on and 1 week off for total of 1 year.  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  He was evaluated in the emergency department and placed on clonidine 0.2 mg 3 times daily.  The hypertension was uncontrolled with this as well as other medications.  Sunitinib was therefore discontinued after only 2 weeks of therapy.  He saw Dr. [name] for routine follow-up in February 2021.  At that time, CT abdomen and pelvis revealed basilar pulmonary nodules, which had increased in size and number, and were felt to be highly suspicious for metastatic disease.  There was a persistent splenic lesion, once again felt to  be benign.  He was referred back to [place].  He had worsening anemia and iron panel and ferritin were equivocal, but a soluble transferrin was normal, so inconsistent with iron deficiency. PET scan revealed several of the pulmonary nodules to be hypermetabolic, consistent with metastatic disease.  There was no evidence of recurrent or metastatic disease within the abdomen and pelvis.  We recommended palliative immunotherapy with ipilimumab/nivolumab for 4 cycles to be followed by maintenance nivolumab.  He experienced severe pain of his right shoulder and down his right arm with associated swelling.  He is unable to fully lift his arm, and his right neck is also swollen. Ultrasound was negative for thrombosis.  His Port-A-Cath was then removed.  He was seen in clinic at the end of the day on July 1st., after he had been seen in the emergency department earlier in the day because of mild hemoptysis.  CTA chest with contrast revealed pulmonary metastases with mild progression from March 2021.  The apical segment right lower lobe nodule was 17 mm, previously 10 mm.  Mild ground-glass opacity around a right lower lobe nodule measuring 22 mm, previously 14 mm.  It was unclear if this represents pseudoprogression or true progression.  He had persistentsevere right shoulder pain, despite tramadol.  Plain films of the right shoulderIn July revealed a large lucent lesion in the right humeral head with cortical destruction, as well as moderate degenerative changes of the acromioclavicular and glenohumeral joints.  CT chest, abdomen and pelvis in July revealed a destructive metastatic lesion of the right proximal humerus measuring 19 mm.  Numerous pulmonary nodules were again observed, similar to June, but new and enlarging nodules overall since March 2021.  The splenic lesion measuring 5.6 x 3.0 cm, previously 4.7 x 5.0 cm, was indeterminate, but suspicious for metastasis despite lack of FDG uptake, as it has enlarged since  previous imaging.  Nivolumab was discontinued.  He saw Dr. [name] and received palliative radiation to the right humeral head lesion in July.   He was placed on denosumab every 4 weeks for the bone metastasis.  Due to his progressive disease he was switched to palliative oral chemotherapy with cabozantinib 60 mg daily in July. Cabozantinib had to be held briefly due to uncontrolled hypertension.  He also had hypopigmentation of the perioral area and upper  back.  He discontinued tramadol as this was causing severe headaches and was using hydrocodone/APAP as needed for pain, but he did not tolerate that either.  He resumed cabozantinib in August once his blood pressure was controlled.  He had mild hyperkalemia at that time, so we increased his HCTZ to 2 tabs daily.  He had persistent right shoulder pain, for which he was using Tylenol, then Advil.  We added mirtazapine 15 mg at bedtime for insomnia in September.  He discontinued cabozantinib in September due to progressive vitiligo.  We recommended alternative therapy with axitinib 5 mg twice daily.  He has continueed denosumab  every 4 weeks.  Repeat CT chest, abdomen and pelvis in September prior to starting axitinib revealed a decrease in the pulmonary nodules, without evidence of new metastatic disease in the lungs or abdomen and pelvis.  He was found to have an elevated uric acid and findings consistent with gouty arthritis, which he claims he has dealt with in the past.  He had symptoms of shortness of breath, fatigue and hoarseness, as well as a single episode of hemoptysis, so he stopped his axitinib on his own. Repeat CT imaging  in November revealed mild interval progression of bilateral pulmonary metastases, and slight interval increase in size of bilateral external iliac lymph nodes although they remain within normal limits for size.  No evidence for recurrent in the nephrectomy bed was seen.  We resumed axitinib at a lower dose of 3 mg twice daily but  unfortunately, he experienced the same side effects, including shortness of breath and edema. CTA chest in late November was read as a very limited study due to respiratory motion artifact. No large or central pulmonary artery embolus identified. Known pulmonary metastatic disease and aortic atherosclerosis.  Axitinib was discontinued. He eventually resumed hydrocodone/APAP 10/325 at bedtime.  We recommended alternative treatment with everolimus 10 mg daily which he started on January 15th. At his visit on February 1st, he was tolerating everolimus fairly well, except for pruritus.  He was given triamcinolone cream.  He had continued right shoulder pain, which was waking him up at night despite hydrocodone /APAP and Advil at bedtime, so we started him on MS Contin 15 mg every 12 hours.  We tried increasing the dose to 30 mg but he felt this did not give him adequate relief and he has stopped this.  His anemia has been steadily worsening, and evaluation at his last visit revealed a normal B12 and folate, no evidence of hemolysis, but equivocal results on his iron studies.  He was given IV iron in March.    INTERVAL HISTORY:  Steven Peck is here for follow up and to review recent imaging results.  X-ray imaging of the right humerus revealed a 3.2 cm lucent lesion in the humeral head with adjacent cortical thinning consistent with metastasis. Cortical breakthrough is best seen on concurrent chest CT. No pathologic fracture. No evidence of additional humeral lesion.  CT chest, abdomen and pelvis revealed generalized mild progression of bilateral pulmonary nodules compatible with metastatic disease. Index anterior right upper lobe lesion measured previously at 1.4 cm is 1.8 cm.  An 8 mm left lower lobe nodule measured previously is 7 mm.  The 1.7 cm right lower lobe nodule measured previously is 2.0 cm.  Index right lower lobe retro hilar nodule measured previously at 1.4 cm measures 2.1 cm.  No definite new pulmonary  nodule or mass.  There is similar appearance of the lytic lesion involving the right humeral head with associated joint effusion.  No evidence for local recurrence in the left nephrectomy bed.  He just received 5 doses of IV Venofer from March 15th through March 23rd.  His hemoglobin is stable to mildly improved to 7.5, but this should improve over time.  He continues to have significant right shoulder pain which causes insomnia.  He has been using hydrocodone 10 mg as needed with mild relief as well as Advil during the day.   He continues everolimus 10 mg daily.  White count and platelets are normal.  Chemistries are unremarkable except for a calcium of 8.3.  He has  not been able to eat well lately due to trouble with a filling, and he has lost 8 pounds.  I advised that he go ahead and schedule dental work as his white count and platelets are normal.  He denies fever, chills or other signs of infection.  He denies nausea, vomiting, bowel issues, or abdominal pain.  He denies sore throat, cough, dyspnea, or chest pain.  REVIEW OF SYSTEMS:  Review of Systems  Constitutional: Positive for fatigue (mild). Negative for appetite change, chills, fever and unexpected weight change.  HENT:   Positive for mouth sores (intermittent).   Eyes: Negative.   Respiratory: Negative.  Negative for chest tightness, cough, hemoptysis, shortness of breath and wheezing.   Cardiovascular: Negative.  Negative for chest pain, leg swelling and palpitations.  Gastrointestinal: Negative.  Negative for abdominal distention, abdominal pain, blood in stool, constipation, diarrhea, nausea and vomiting.  Endocrine: Negative.   Genitourinary: Negative.  Negative for difficulty urinating, dysuria, frequency and hematuria.   Musculoskeletal: Negative for arthralgias, back pain, flank pain, gait problem and myalgias.       Persistent pain of the right shoulder  Skin: Negative.   Neurological: Negative.  Negative for dizziness, extremity  weakness, gait problem, headaches, light-headedness, numbness, seizures and speech difficulty.  Hematological: Negative.   Psychiatric/Behavioral: Positive for sleep disturbance (due to pain of the shoulder). Negative for depression. The patient is not nervous/anxious.   All other systems reviewed and are negative.    VITALS:  Blood pressure (!) 149/84, pulse (!) 101, temperature 98.1 F (36.7 C), temperature source Oral, resp. rate 18, height $RemoveBe'5\' 10"'eqWKkYvwR$  (1.778 m), weight 212 lb 11.2 oz (96.5 kg), SpO2 100 %.  Wt Readings from Last 3 Encounters:  09/04/20 212 lb 11.2 oz (96.5 kg)  08/25/20 220 lb 12.8 oz (100.2 kg)  08/20/20 219 lb 0.8 oz (99.4 kg)    Body mass index is 30.52 kg/m.  Performance status (ECOG): 1 - Symptomatic but completely ambulatory  PHYSICAL EXAM:  Physical Exam was stable other than tachycardia  LABS:   CBC Latest Ref Rng & Units 09/04/2020 08/25/2020 07/29/2020  WBC - 4.9 4.8 4.8  Hemoglobin 13.5 - 17.5 7.5(A) 7.2(A) 8.3(A)  Hematocrit 41 - 53 25(A) 24(A) 27(A)  Platelets 150 - 399 359 353 271   CMP Latest Ref Rng & Units 09/04/2020 08/25/2020 07/29/2020  Glucose 70 - 99 mg/dL - - -  BUN 4 - $R'21 14 15 17  'vT$ Creatinine 0.6 - 1.3 1.1 1.0 1.1  Sodium 137 - 147 136(A) 133(A) 136(A)  Potassium 3.4 - 5.3 4.0 4.0 4.5  Chloride 99 - 108 105 105 106  CO2 13 - $Re'22 20 22 'Wjv$ 24(A)  Calcium 8.7 - 10.7 8.3(A) 8.4(A) 8.7  Alkaline Phos 25 - 125 100 92 85  AST 14 - 40 $Re'21 23 22  'gQC$ ALT 10 - 40 $Re'15 15 13      'GFH$ Lab Results  Component Value Date   TIBC 234 07/29/2020   FERRITIN 681 07/29/2020   IRONPCTSAT TNP 07/29/2020   Lab Results  Component Value Date   LDH 302 (A) 07/29/2020    STUDIES:   EXAM: 09/02/2020 RIGHT HUMERUS - 2+ VIEW  COMPARISON:  Included portion from chest CT performed immediately after radiograph.  FINDINGS: Lucent lesion in the humeral head measures approximately 3.2 cm with adjacent cortical thinning, there is cortical breakthrough on concurrent CT. No  evidence of pathologic fracture. No additional focal lesion in the humerus. Elbow alignment is maintained. There is  an olecranon spur.  IMPRESSION: A 3.2 cm lucent lesion in the humeral head with adjacent cortical thinning consistent with metastasis. Cortical breakthrough is best seen on concurrent chest CT. No pathologic fracture. No evidence of additional humeral lesion.  EXAM: 09/02/2020 CT CHEST WITH CONTRAST  CT ABDOMEN AND PELVIS WITH AND WITHOUT CONTRAST  TECHNIQUE: Multidetector CT imaging of the chest was performed during intravenous contrast administration. Multidetector CT imaging of the abdomen and pelvis was performed following the standard protocol before and during bolus administration of intravenous contrast.  CONTRAST:  100 cc Isovue 300  COMPARISON:  Chest 7 pelvis CT 04/08/2020.  CTA Chest 04/22/2020  FINDINGS: CT CHEST FINDINGS  Cardiovascular: The heart size is normal. No substantial pericardial effusion. Atherosclerotic calcification is noted in the wall of the thoracic aorta. Ascending thoracic aorta measures up to 4 cm diameter.  Mediastinum/Nodes: No mediastinal lymphadenopathy. There is no hilar lymphadenopathy. The esophagus has normal imaging features. There is no axillary lymphadenopathy.  Lungs/Pleura: Bilateral pulmonary nodules again identified.  Index anterior right upper lobe lesion measured previously at 1.4 cm is 1.8 cm today on 62/7.  8 mm left lower lobe nodule measured previously is 7 mm today on 59/7.  1.7 cm right lower lobe nodule measured previously is 2.0 cm today on 75/7.  Index right lower lobe retro hilar nodule measured previously at 1.4 cm measures 2.1 cm today with interval development of satellite nodularity in peripheral tree-in-bud opacity, compatible with a degree of airway involvement/impaction.  Additional scattered tiny bilateral pulmonary nodules are evident. No definite new suspicious pulmonary nodule or  mass.  No pleural effusion.  Musculoskeletal: No worrisome lytic or sclerotic osseous abnormality. Lytic lesion in the right humeral head with joint effusion is similar to prior.  CT ABDOMEN AND PELVIS FINDINGS  Hepatobiliary: No suspicious focal abnormality within the liver parenchyma. There is no evidence for gallstones, gallbladder wall thickening, or pericholecystic fluid. No intrahepatic or extrahepatic biliary dilation.  Pancreas: No focal mass lesion. No dilatation of the main duct. No intraparenchymal cyst. No peripancreatic edema.  Spleen: 6 mm low-density lesion in the dome of the spleen not definitely seen on prior imaging.  Adrenals/Urinary Tract: Right adrenal gland unremarkable. Left adrenal gland not well seen. Left kidney surgically absent with no new or suspicious soft tissue in the nephrectomy bed. Right kidney unremarkable right ureter unremarkable. Bladder is decompressed..  Stomach/Bowel: Stomach is nondistended. Duodenum is normally positioned as is the ligament of Treitz. No small bowel wall thickening. No small bowel dilatation. The terminal ileum is normal. The appendix is not well visualized, but there is no edema or inflammation in the region of the cecum. No gross colonic mass. No colonic wall thickening.  Vascular/Lymphatic: There is abdominal aortic atherosclerosis without aneurysm. There is no gastrohepatic or hepatoduodenal ligament lymphadenopathy. No retroperitoneal or mesenteric lymphadenopathy. No pelvic sidewall lymphadenopathy. Prominent external iliac node seen in the pelvis bilaterally have decreased in the interval.  Reproductive: The prostate gland and seminal vesicles are unremarkable.  Other: No intraperitoneal free fluid.  Musculoskeletal: Degenerative changes are noted in the left hip and lumbar spine. No worrisome lytic or sclerotic osseous abnormality.  IMPRESSION: 1. Generalized mild progression of bilateral pulmonary  nodules compatible with metastatic disease. No definite new pulmonary nodule or mass. 2. Similar appearance of the lytic lesion involving the right humeral head with associated joint effusion. 3. No evidence for local recurrence in the left nephrectomy bed. CTA Chest 04/22/2020 describe some soft tissue in the nephrectomy bed,  but that finding is seen to represent bowel on today's study. 4.  Aortic Atherosclerois (ICD10-170.0)   HISTORY:   Allergies:  Allergies  Allergen Reactions  . Hydrochlorothiazide Swelling  . Lisinopril Swelling    Angioedema of lips  . Tramadol     Current Medications: Current Outpatient Medications  Medication Sig Dispense Refill  . allopurinol (ZYLOPRIM) 100 MG tablet Take 100 mg by mouth daily.    Marland Kitchen amoxicillin (AMOXIL) 500 MG capsule Take 1,000 mg by mouth 2 (two) times daily.    . ANUCORT-HC 25 MG suppository Place 25 mg rectally 2 (two) times daily as needed.    . calcium citrate-vitamin D (CITRACAL+D) 315-200 MG-UNIT tablet Take 1 tablet by mouth 2 (two) times daily.    . clarithromycin (BIAXIN) 500 MG tablet Take 500 mg by mouth 2 (two) times daily.    . Denosumab (XGEVA [place]) Inject into the skin.    Marland Kitchen everolimus (AFINITOR) 10 MG tablet Take 10 mg by mouth daily.    Marland Kitchen HYDROcodone-acetaminophen (NORCO) 10-325 MG tablet Take 1 tablet by mouth every 4 (four) hours as needed (for pain). 120 tablet 0  . hydrocortisone (ANUSOL-HC) 25 MG suppository Place 1 suppository (25 mg total) rectally 2 (two) times daily. 12 suppository 0  . hydrocortisone cream 1 % Apply 1 application topically 2 (two) times daily.    Marland Kitchen lactulose (CHRONULAC) 10 GM/15ML solution Take 10 g by mouth at bedtime as needed for mild constipation.    Marland Kitchen morphine (MS CONTIN) 30 MG 12 hr tablet Take 1 tablet (30 mg total) by mouth every 12 (twelve) hours. 60 tablet 0  . Multiple Vitamins-Minerals (MULTIVITAMIN WITH MINERALS) tablet Take 1 tablet by mouth daily.    Marland Kitchen NARCAN 4 MG/0.1ML LIQD  nasal spray kit SMARTSIG:1 Both Nares Daily PRN    . olmesartan (BENICAR) 20 MG tablet TAKE 1 TABLET BY MOUTH ONCE DAILY FOR 90 DAYS    . ondansetron (ZOFRAN) 4 MG tablet Take 4 mg by mouth every 8 (eight) hours as needed for nausea or vomiting.    . pantoprazole (PROTONIX) 40 MG tablet TAKE 1 BY MOUTH TWICE DAILY    . prochlorperazine (COMPAZINE) 10 MG tablet Take 10 mg by mouth every 6 (six) hours as needed for nausea or vomiting.    . traMADol (ULTRAM) 50 MG tablet Take 50 mg by mouth every 6 (six) hours as needed.    . triamcinolone (KENALOG) 0.025 % cream Apply 1 application topically 2 (two) times daily.    . vitamin B-12 (CYANOCOBALAMIN) 100 MCG tablet Take 100 mcg by mouth daily.     No current facility-administered medications for this visit.     ASSESSMENT & PLAN:   Assessment: 1. Stage III renal cell carcinoma status post radical left nephrectomy.  Due to the stage and renal vein invasion, we recommended adjuvant therapy with sunitinib, which he could not tolerate due to severe uncontrolled hypertension.   2. Recurrent renal cell carcinoma with PET positive pulmonary nodules. He was treated with ipilimumab/nivolumab, but unfortunately had progressive disease, including bone metastasis. He has had difficulty tolerating oral chemotherapies. He was taking oral cabozantinib, but he eventually discontinued this due to progressive vitiligo.  He was placed on axitinib but did not tolerate this due to toxicities of edema, shortness of breath, fatigue and hoarseness despite a dose reduction.  He is now on palliative everolimus 10 mg daily and is tolerating this without significant difficulty.  3. Destructive metastatic lesion in the right humeral  head measuring 19 mm.  He received palliative radiation initially with better control of his pain. He is also receiving denosumab every 4 weeks in treatment of his bony metastasis.  He had recurrent severe right shoulder pain, which improved slightly  with MS Contin 15 mg every 12 hours.  I increased the MS Contin to 30 mg every 12 hours, but he did not have much relief and he is no longer taking this.  He will continue to use hydrocodone/APAP 10/325 as needed.  We will plan referral to an orthopedic oncologist.  I will try him on gabapentin 300 mg at bedtime.  4. Worsening microcytic anemia with hemoglobin of 7.2, likely due to recurrent iron deficiency from chronic rectal bleeding. Iron studies were most consistent with anemia of chronic disease with a low iron and iron binding capacity and an elevated ferritin so eventually he still needs a GI workup.  He received 5 doses of Venofer from March 15th through the 23rd.  His hemoglobin came up from 7.2 to 7.5.    5. Vitamin-D deficiency, he continues vitamin-D 3 2000 international units daily.  6. Hypertension, which has been very difficult to control mainly due to the targeted therapies.  This persists, but is not severe.  7. Episode of hemoptysis in late June.  CTA imaging revealed pulmonary metastases with progression from March 2021.  Apical segment right lower lobe nodule now measures 17 mm, previously 10 mm.  Mild ground-glass opacity around a right lower lobe nodule measuring 22 mm, previously 14 mm.  CT imaging from July also confirmed worsening disease.  He has not had further episodes of hemoptysis.  8. Splenic lesion measuring 5.6 x 3.0 cm, which is suspicious for metastatic disease.   9.  Rectal bleeding, which he attributes to a hemorrhoid. I will treat him with Anusol HC suppositories and Dr. Lyda Jester has placed him on an acid reducer, but he has been unable to pick this up.  10.  Presumed Helicobacter Pylori infection, being treated with clarithromycin and amoxicillin as well as an acid reducer.  I instructed him to contact Dr. Lyda Jester for an alternative PPI.    Plan:    He continues to have significant right shoulder pain and X-ray imaging of the right humerus revealed  a 3.2 cm lucent lesion in the humeral head with adjacent cortical thinning.  He is agreeable to consultation with an oncology orthopedic surgeon to discuss potential surgical intervention, so we will make the appropriate referral.  I will add in gabapentin 300 mg at bedtime and we will likely increase this dose over time.  He received 5 doses of Venofer from March 15th through March 23rd, and his hemoglobin should improve over time.  If his hemoglobin drops any lower, will need to consider transfusion, but he is really not terribly symptomatic at this level.  We will plan to see him back in 4 weeks with a CBC and comprehensive metabolic panel for re-examination.  The patient and his significant other understand the plans discussed today and are in agreement with them.  They know to contact our office if he develops symptoms of worsening anemia or other concerns prior to his next appointment.   I provided 30 minutes of face-to-face time during this this encounter and > 50% was spent counseling as documented under my assessment and plan.     I, Rita Ohara, am acting as scribe for Derwood Kaplan, MD  I have reviewed this report as typed by the medical  scribe, and it is complete and accurate.  Hermina Barters

## 2020-09-05 ENCOUNTER — Telehealth: Payer: Self-pay

## 2020-09-05 ENCOUNTER — Other Ambulatory Visit: Payer: Self-pay

## 2020-09-05 ENCOUNTER — Other Ambulatory Visit: Payer: Self-pay | Admitting: Hematology and Oncology

## 2020-09-05 DIAGNOSIS — C7951 Secondary malignant neoplasm of bone: Secondary | ICD-10-CM

## 2020-09-05 DIAGNOSIS — C7801 Secondary malignant neoplasm of right lung: Secondary | ICD-10-CM

## 2020-09-05 DIAGNOSIS — N2889 Other specified disorders of kidney and ureter: Secondary | ICD-10-CM

## 2020-09-05 DIAGNOSIS — C642 Malignant neoplasm of left kidney, except renal pelvis: Secondary | ICD-10-CM

## 2020-09-05 DIAGNOSIS — C7802 Secondary malignant neoplasm of left lung: Secondary | ICD-10-CM

## 2020-09-05 MED ORDER — PREGABALIN 25 MG PO CAPS: 25.0000 mg | ORAL_CAPSULE | Freq: Two times a day (BID) | ORAL | 0 refills | Status: DC

## 2020-09-05 NOTE — Progress Notes (Unsigned)
pr

## 2020-09-05 NOTE — Telephone Encounter (Signed)
Faxed referral

## 2020-09-05 NOTE — Telephone Encounter (Signed)
-----  Message from Derwood Kaplan, MD sent at 09/04/2020  7:31 PM EDT ----- Regarding: referral Pls ref to Orthopedic Oncologist at [facility], there is Dr. Magda Bernheim, Dr. Leonides Schanz and a younger woman, poss Dr. Mylo Red? 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 Can anything else be done?

## 2020-09-09 ENCOUNTER — Other Ambulatory Visit: Payer: Self-pay

## 2020-09-09 ENCOUNTER — Telehealth: Payer: Self-pay

## 2020-09-09 DIAGNOSIS — C7951 Secondary malignant neoplasm of bone: Secondary | ICD-10-CM

## 2020-09-09 DIAGNOSIS — M25511 Pain in right shoulder: Secondary | ICD-10-CM

## 2020-09-09 NOTE — Telephone Encounter (Addendum)
Steven Peck: 09/11/20 @ 1430 - I spoke with Steven Peck & Steven Peck to give them new appt with Dr Magda Bernheim, orthopedic oncologist @ the [facility] in [place]. Steven appt is 09/23/2020 @ 130 pm. I gave them address : [address], [place] [address]  Phone # [phone].  Pt will need to take CD copy of scans with him to appt. Pt to park on purple level in parking deck, office is on 4th floor in the cancer center.   Steven Peck: 09/11/20 @ 1615- I called to check on referral appointment for pt (referral was sent on 4/8/2). I spoke to [place] @ [place]. She scheduled Steven appt with me, while on the phone. I asked for first available with whichever Dr. She gave me appt for 09/23/20 @ 130p with Dr Magda Bernheim.    Steven Peck: 09/09/20 @ 449pm; I finally was able to speak with pt. He was just moaning and groaning, poor thing. Steven shoulder is killing him. He admitted to me that he has not been taking the MS Contin 30 BID. He said it wasn't helping and he told Dr Hinton Rao that the last time he was here. I told him it is necessary for him to take a long acting pain medication to keep a therapeutic level in Steven body. Then if he still wasn't getting enough pain relief, the Dr can increase it. He is taking the hydrocodone, and needs a refill. He is taking the Lyrica, but cant tell that it helps. I told him to keep taking the medications as prescribed, as it is going to take more than 1 medication to help control Steven pain most likely. He verbalized understanding. He is using heating pad @ times to shoulder. He also mentioned the pain was so bad today, he couldn't go to work. He asked to speak with Evelena Peat, as he said he was going to have to go on disability this year. He is required to lift @ work, and he is in too much pain for that.  ===View-only below this line=== ----- Message ----- From: Marvia Pickles, PA-C Sent: 09/05/2020   4:40 PM EDT To: Dairl Ponder, RN Subject: FW:  new med                                    FYI, I sent in Lyrica 25mg  bid, but left him a voicemail to start with one HS and let us know if that didn't work.   ----- Message ----- From: Derwood Kaplan, MD Sent: 09/05/2020  12:58 PM EDT To: Melodye Ped, NP, Marvia Pickles, PA-C Subject: RE: new med                                    I guess that would be the best option  ----- Message ----- From: Marvia Pickles, PA-C Sent: 09/05/2020  11:13 AM EDT To: Derwood Kaplan, MD, Melodye Ped, NP Subject: RE: new med                                    The problem is the capsule, but I could not find 300mg  tabs. Lyrica is preferred by Steven insurance, can we use that? Start  with 25mg  hs? Thanks!  ----- Message ----- From: Derwood Kaplan, MD Sent: 09/04/2020   7:29 PM EDT To: Melodye Ped, NP, Marvia Pickles, PA-C Subject: new med                                        Severe pain of right humeral head, hx XRT, might need more. I am referring to Ortho Oncology at [facility] for any ideas. He can't sleep at night due to this.  So I told him I would start gabapentin 300 mg hs & we could later increase to bid or tid BUT when I order it, not on Steven preferred list, although 600 mg and 800 mg strengths are. I am nervous about trying to start with that when he hasn't had before.  I didn't see 100mg  listed. Should we try Lyrica instead.  When you try to order the 300 mg, the message comes up, help

## 2020-09-10 ENCOUNTER — Other Ambulatory Visit: Payer: Self-pay | Admitting: Hematology and Oncology

## 2020-09-10 MED ORDER — HYDROCODONE-ACETAMINOPHEN 10-325 MG PO TABS: 1.0000 | ORAL_TABLET | ORAL | 0 refills | Status: DC | PRN

## 2020-09-10 NOTE — Progress Notes (Signed)
Patient called me yesterday and left me a voicemail about applying for disability. I tried to call him back today and reached his voicemail. Asked that he call me back.

## 2020-09-10 NOTE — Progress Notes (Signed)
Spoke with patient, he wants to apply for disability in a couple months. I told him to give me a call when he is ready, he has my phone number.

## 2020-09-23 DIAGNOSIS — C7951 Secondary malignant neoplasm of bone: Secondary | ICD-10-CM | POA: Diagnosis not present

## 2020-09-23 DIAGNOSIS — C649 Malignant neoplasm of unspecified kidney, except renal pelvis: Secondary | ICD-10-CM | POA: Insufficient documentation

## 2020-09-23 DIAGNOSIS — C642 Malignant neoplasm of left kidney, except renal pelvis: Secondary | ICD-10-CM | POA: Diagnosis not present

## 2020-10-01 ENCOUNTER — Inpatient Hospital Stay: Payer: BC Managed Care – PPO | Admitting: Hematology and Oncology

## 2020-10-01 ENCOUNTER — Inpatient Hospital Stay: Payer: BC Managed Care – PPO | Attending: Oncology

## 2020-10-01 DIAGNOSIS — C7951 Secondary malignant neoplasm of bone: Secondary | ICD-10-CM | POA: Insufficient documentation

## 2020-10-01 DIAGNOSIS — E559 Vitamin D deficiency, unspecified: Secondary | ICD-10-CM | POA: Insufficient documentation

## 2020-10-01 DIAGNOSIS — B9681 Helicobacter pylori [H. pylori] as the cause of diseases classified elsewhere: Secondary | ICD-10-CM | POA: Insufficient documentation

## 2020-10-01 DIAGNOSIS — C642 Malignant neoplasm of left kidney, except renal pelvis: Secondary | ICD-10-CM | POA: Insufficient documentation

## 2020-10-01 DIAGNOSIS — I10 Essential (primary) hypertension: Secondary | ICD-10-CM | POA: Insufficient documentation

## 2020-10-01 DIAGNOSIS — D500 Iron deficiency anemia secondary to blood loss (chronic): Secondary | ICD-10-CM | POA: Diagnosis not present

## 2020-10-01 DIAGNOSIS — C7800 Secondary malignant neoplasm of unspecified lung: Secondary | ICD-10-CM | POA: Insufficient documentation

## 2020-10-01 DIAGNOSIS — Z79899 Other long term (current) drug therapy: Secondary | ICD-10-CM | POA: Insufficient documentation

## 2020-10-01 DIAGNOSIS — Z905 Acquired absence of kidney: Secondary | ICD-10-CM | POA: Insufficient documentation

## 2020-10-02 ENCOUNTER — Other Ambulatory Visit: Payer: Self-pay | Admitting: Hematology and Oncology

## 2020-10-02 DIAGNOSIS — C642 Malignant neoplasm of left kidney, except renal pelvis: Secondary | ICD-10-CM | POA: Diagnosis not present

## 2020-10-02 DIAGNOSIS — C7801 Secondary malignant neoplasm of right lung: Secondary | ICD-10-CM

## 2020-10-07 ENCOUNTER — Encounter: Payer: Self-pay | Admitting: Hematology and Oncology

## 2020-10-07 ENCOUNTER — Other Ambulatory Visit: Payer: Self-pay

## 2020-10-07 ENCOUNTER — Inpatient Hospital Stay: Payer: BC Managed Care – PPO

## 2020-10-07 DIAGNOSIS — C642 Malignant neoplasm of left kidney, except renal pelvis: Secondary | ICD-10-CM | POA: Diagnosis not present

## 2020-10-07 DIAGNOSIS — C7801 Secondary malignant neoplasm of right lung: Secondary | ICD-10-CM

## 2020-10-07 DIAGNOSIS — C7802 Secondary malignant neoplasm of left lung: Secondary | ICD-10-CM

## 2020-10-07 DIAGNOSIS — C7951 Secondary malignant neoplasm of bone: Secondary | ICD-10-CM | POA: Diagnosis not present

## 2020-10-07 DIAGNOSIS — Z510 Encounter for antineoplastic radiation therapy: Secondary | ICD-10-CM | POA: Diagnosis not present

## 2020-10-07 LAB — CBC AND DIFFERENTIAL
HCT: 21 — AB (ref 41–53)
Hemoglobin: 6.2 — AB (ref 13.5–17.5)
Neutrophils Absolute: 2.45
Platelets: 305 (ref 150–399)
WBC: 4.3

## 2020-10-07 LAB — HEPATIC FUNCTION PANEL
ALT: 17 (ref 10–40)
AST: 21 (ref 14–40)
Alkaline Phosphatase: 114 (ref 25–125)
Bilirubin, Total: 0.5

## 2020-10-07 LAB — BASIC METABOLIC PANEL
BUN: 17 (ref 4–21)
CO2: 20 (ref 13–22)
Chloride: 107 (ref 99–108)
Creatinine: 1.1 (ref 0.6–1.3)
Glucose: 120
Potassium: 4.2 (ref 3.4–5.3)
Sodium: 138 (ref 137–147)

## 2020-10-07 LAB — COMPREHENSIVE METABOLIC PANEL
Albumin: 4.2 (ref 3.5–5.0)
Calcium: 8.8 (ref 8.7–10.7)

## 2020-10-07 LAB — CBC: RBC: 3.34 — AB (ref 3.87–5.11)

## 2020-10-08 DIAGNOSIS — Z510 Encounter for antineoplastic radiation therapy: Secondary | ICD-10-CM | POA: Diagnosis not present

## 2020-10-08 DIAGNOSIS — C642 Malignant neoplasm of left kidney, except renal pelvis: Secondary | ICD-10-CM | POA: Diagnosis not present

## 2020-10-08 DIAGNOSIS — C7951 Secondary malignant neoplasm of bone: Secondary | ICD-10-CM | POA: Diagnosis not present

## 2020-10-09 DIAGNOSIS — Z510 Encounter for antineoplastic radiation therapy: Secondary | ICD-10-CM | POA: Diagnosis not present

## 2020-10-09 DIAGNOSIS — C7951 Secondary malignant neoplasm of bone: Secondary | ICD-10-CM | POA: Diagnosis not present

## 2020-10-09 DIAGNOSIS — C642 Malignant neoplasm of left kidney, except renal pelvis: Secondary | ICD-10-CM | POA: Diagnosis not present

## 2020-10-10 ENCOUNTER — Other Ambulatory Visit: Payer: Self-pay | Admitting: Hematology and Oncology

## 2020-10-10 DIAGNOSIS — C642 Malignant neoplasm of left kidney, except renal pelvis: Secondary | ICD-10-CM | POA: Diagnosis not present

## 2020-10-10 DIAGNOSIS — C7951 Secondary malignant neoplasm of bone: Secondary | ICD-10-CM | POA: Diagnosis not present

## 2020-10-10 DIAGNOSIS — Z510 Encounter for antineoplastic radiation therapy: Secondary | ICD-10-CM | POA: Diagnosis not present

## 2020-10-13 DIAGNOSIS — Z510 Encounter for antineoplastic radiation therapy: Secondary | ICD-10-CM | POA: Diagnosis not present

## 2020-10-13 DIAGNOSIS — C642 Malignant neoplasm of left kidney, except renal pelvis: Secondary | ICD-10-CM | POA: Diagnosis not present

## 2020-10-13 DIAGNOSIS — C7951 Secondary malignant neoplasm of bone: Secondary | ICD-10-CM | POA: Diagnosis not present

## 2020-10-14 ENCOUNTER — Other Ambulatory Visit: Payer: Self-pay | Admitting: Hematology and Oncology

## 2020-10-14 DIAGNOSIS — C642 Malignant neoplasm of left kidney, except renal pelvis: Secondary | ICD-10-CM

## 2020-10-14 DIAGNOSIS — Z510 Encounter for antineoplastic radiation therapy: Secondary | ICD-10-CM | POA: Diagnosis not present

## 2020-10-14 DIAGNOSIS — C7951 Secondary malignant neoplasm of bone: Secondary | ICD-10-CM

## 2020-10-14 DIAGNOSIS — C7801 Secondary malignant neoplasm of right lung: Secondary | ICD-10-CM

## 2020-10-15 DIAGNOSIS — Z510 Encounter for antineoplastic radiation therapy: Secondary | ICD-10-CM | POA: Diagnosis not present

## 2020-10-15 DIAGNOSIS — C7951 Secondary malignant neoplasm of bone: Secondary | ICD-10-CM | POA: Diagnosis not present

## 2020-10-15 DIAGNOSIS — C642 Malignant neoplasm of left kidney, except renal pelvis: Secondary | ICD-10-CM | POA: Diagnosis not present

## 2020-10-16 DIAGNOSIS — C642 Malignant neoplasm of left kidney, except renal pelvis: Secondary | ICD-10-CM | POA: Diagnosis not present

## 2020-10-16 DIAGNOSIS — C7951 Secondary malignant neoplasm of bone: Secondary | ICD-10-CM | POA: Diagnosis not present

## 2020-10-16 DIAGNOSIS — Z510 Encounter for antineoplastic radiation therapy: Secondary | ICD-10-CM | POA: Diagnosis not present

## 2020-10-17 ENCOUNTER — Telehealth: Payer: Self-pay

## 2020-10-17 DIAGNOSIS — C642 Malignant neoplasm of left kidney, except renal pelvis: Secondary | ICD-10-CM | POA: Diagnosis not present

## 2020-10-17 DIAGNOSIS — Z510 Encounter for antineoplastic radiation therapy: Secondary | ICD-10-CM | POA: Diagnosis not present

## 2020-10-17 DIAGNOSIS — C7951 Secondary malignant neoplasm of bone: Secondary | ICD-10-CM | POA: Diagnosis not present

## 2020-10-17 NOTE — Telephone Encounter (Signed)
@   1342- Received call from [facility] @ Dr Dois Davenport office. Dr Redmond Pulling asked her to call us and f/u if pt had received radiation to his shoulder as recommended.   @1438 - I called Lattie Haw @ Dr [facility] office, & LVM that Mr Mark will complete his radiation to his shoulder on 10/20/20. I asked her to return my call if she had any further questions.

## 2020-10-20 ENCOUNTER — Inpatient Hospital Stay (INDEPENDENT_AMBULATORY_CARE_PROVIDER_SITE_OTHER): Payer: BC Managed Care – PPO | Admitting: Hematology and Oncology

## 2020-10-20 ENCOUNTER — Telehealth: Payer: Self-pay | Admitting: Hematology and Oncology

## 2020-10-20 ENCOUNTER — Inpatient Hospital Stay: Payer: BC Managed Care – PPO

## 2020-10-20 ENCOUNTER — Other Ambulatory Visit: Payer: Self-pay

## 2020-10-20 ENCOUNTER — Encounter: Payer: Self-pay | Admitting: Hematology and Oncology

## 2020-10-20 ENCOUNTER — Other Ambulatory Visit: Payer: Self-pay | Admitting: Hematology and Oncology

## 2020-10-20 ENCOUNTER — Telehealth: Payer: Self-pay | Admitting: Oncology

## 2020-10-20 VITALS — BP 113/77 | HR 127 | Temp 98.3°F | Resp 18 | Ht 70.0 in | Wt 203.2 lb

## 2020-10-20 DIAGNOSIS — C7951 Secondary malignant neoplasm of bone: Secondary | ICD-10-CM | POA: Diagnosis not present

## 2020-10-20 DIAGNOSIS — B9681 Helicobacter pylori [H. pylori] as the cause of diseases classified elsewhere: Secondary | ICD-10-CM | POA: Diagnosis not present

## 2020-10-20 DIAGNOSIS — C642 Malignant neoplasm of left kidney, except renal pelvis: Secondary | ICD-10-CM

## 2020-10-20 DIAGNOSIS — C7800 Secondary malignant neoplasm of unspecified lung: Secondary | ICD-10-CM | POA: Diagnosis not present

## 2020-10-20 DIAGNOSIS — Z905 Acquired absence of kidney: Secondary | ICD-10-CM | POA: Diagnosis not present

## 2020-10-20 DIAGNOSIS — D500 Iron deficiency anemia secondary to blood loss (chronic): Secondary | ICD-10-CM

## 2020-10-20 DIAGNOSIS — E559 Vitamin D deficiency, unspecified: Secondary | ICD-10-CM | POA: Diagnosis not present

## 2020-10-20 DIAGNOSIS — C7802 Secondary malignant neoplasm of left lung: Secondary | ICD-10-CM

## 2020-10-20 DIAGNOSIS — I10 Essential (primary) hypertension: Secondary | ICD-10-CM | POA: Diagnosis not present

## 2020-10-20 DIAGNOSIS — C7801 Secondary malignant neoplasm of right lung: Secondary | ICD-10-CM

## 2020-10-20 DIAGNOSIS — Z79899 Other long term (current) drug therapy: Secondary | ICD-10-CM | POA: Diagnosis not present

## 2020-10-20 LAB — CBC AND DIFFERENTIAL
HCT: 27 — AB (ref 41–53)
Hemoglobin: 8.4 — AB (ref 13.5–17.5)
Neutrophils Absolute: 4.16
Platelets: 466 — AB (ref 150–399)
WBC: 6.4

## 2020-10-20 LAB — IRON AND TIBC
Iron: 17 ug/dL — ABNORMAL LOW (ref 45–182)
Saturation Ratios: 8 % — ABNORMAL LOW (ref 17.9–39.5)
TIBC: 224 ug/dL — ABNORMAL LOW (ref 250–450)
UIBC: 207 ug/dL

## 2020-10-20 LAB — CBC: RBC: 4.15 (ref 3.87–5.11)

## 2020-10-20 LAB — HEPATIC FUNCTION PANEL
ALT: 19 (ref 10–40)
AST: 21 (ref 14–40)
Alkaline Phosphatase: 153 — AB (ref 25–125)
Bilirubin, Total: 0.7

## 2020-10-20 LAB — BASIC METABOLIC PANEL WITH GFR
BUN: 17 (ref 4–21)
CO2: 22 (ref 13–22)
Chloride: 96 — AB (ref 99–108)
Creatinine: 1.3 (ref 0.6–1.3)
Glucose: 113
Potassium: 4.4 (ref 3.4–5.3)
Sodium: 130 — AB (ref 137–147)

## 2020-10-20 LAB — COMPREHENSIVE METABOLIC PANEL WITH GFR
Albumin: 4.1 (ref 3.5–5.0)
Calcium: 8.8 (ref 8.7–10.7)

## 2020-10-20 LAB — FERRITIN: Ferritin: 1807 ng/mL — ABNORMAL HIGH (ref 24–336)

## 2020-10-20 NOTE — Progress Notes (Signed)
Patient spoke with me again about disability last week. He now only plans on working about 2 more weeks, told him I would help him with the application when he is ready.

## 2020-10-20 NOTE — Progress Notes (Signed)
[facility]  [address] [place],  [place]  [address] [phone]  Clinic Day:  10/23/2020  Referring physician: [name], NP   CHIEF COMPLAINT:  CC:  Recurrent kidney cancer with mets to bone and lung  Current Treatment:   Everolimus 10 mg daily   HISTORY OF PRESENT ILLNESS:  Steven Peck is a 55 y.o. 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 We began seeing him in May 2020 when he was referred by [name], NP, for a left renal mass, hypercalcemia and iron deficiency anemia. He had transient hematuria.  CT abdomen and pelvis revealed marked generalized swelling of the left kidney with hyperdensity throughout the renal collecting system, renal pelvis, ureter and posterior bladder consistent with gross hemorrhage with a 6-7 cm infiltrating mass in the upper pole of the left kidney. There was no evidence of venous invasion. There were abnormal nodes in the retroperitoneum medial to the left kidney, up to 1.5 cm in diameter. PET scan revealed malignancy of the kidney, but no definite evidence of metastasis. There were two right pulmonary nodules measuring 3 mm. MRI brain did not reveal any intracranial metastasis. He underwent biopsy of the left kidney mass in June which revealed renal cell carcinoma. He underwent left radical nephrectomy in August. 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 11 lymph nodes were negative for metastasis. Tumor extended into the renal vein, but margins were clear.  He received IV iron in the form of Feraheme in July 2020.  Due to his high risk of recurrence, he was recommended for adjuvant oral chemotherapy with sunitinib 50 mg daily for 2 weeks on and 1 week off for total of 1 year. 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 He was evaluated in the emergency department and placed on  clonidine 0.2 mg 3 times daily. The hypertension was uncontrolled with this as well as other medications. Sunitinib was therefore discontinued after only 2 weeks of therapy.   He saw Dr. [name] for routine follow-up in February 2021. At that time, CT abdomen and pelvis revealed basilar pulmonary nodules, which had increased in size and number, and were felt to be highly suspicious for metastatic disease. There was a persistent splenic lesion, once again felt to be benign. He was referred back to [place]. He had worsening anemia and iron panel and ferritin were equivocal, but a soluble transferrin was normal, so inconsistent with iron deficiency. PET scan revealed several of the pulmonary nodules to be hypermetabolic, consistent with metastatic disease. There was no evidence of recurrent or metastatic disease within the abdomen and pelvis. We recommended palliative immunotherapy with ipilimumab/nivolumab for 4 cycles to be followed by maintenance nivolumab. He experienced severe pain of his right shoulder and down his right arm with associated swelling. He is unable to fully lift his arm, and his right neck is also swollen. Ultrasound was negative for thrombosis. His Port-A-Cath was then removed. He was seen in clinic at the end of the day on July 1st., after he had been seen in the emergency department earlier in the day because of mild hemoptysis. CTA chest with contrast revealed pulmonary metastases with mild progression from March 2021. The apical segment right lower lobe nodule was 17 mm, previously 10 mm. Mild ground-glass opacity around a right lower lobe nodule measuring 22 mm, previously 14 mm. It was unclear if this represents  pseudoprogression or true progression. He had persistentsevere right shoulder pain, despite tramadol.  Plain films of the right shoulderIn July revealed a large lucent lesion in the right humeral head with cortical destruction, as well as moderate degenerative changes of  the acromioclavicular and glenohumeral joints. CT chest, abdomen and pelvis in July revealed a destructive metastatic lesion of the right proximal humerus measuring 19 mm. Numerous pulmonary nodules were again observed, similar to June, but new and enlarging nodules overall since March 2021. The splenic lesion measuring 5.6 x 3.0 cm, previously 4.7 x 5.0 cm, was indeterminate, but suspicious for metastasis despite lack of FDG uptake, as it has enlarged since previous imaging. Nivolumab was discontinued. He saw Dr. [name] and received palliative radiation to the right humeral head lesion in July.  He was placed on denosumab every 4 weeks for the bone metastasis.  Due to his progressive disease he was switched to palliative oral chemotherapy with cabozantinib 60 mg daily in July. Cabozantinib had to be held briefly due to uncontrolled hypertension. He also had hypopigmentation of the perioral area and upper  back. He discontinued tramadol as this was causing severe headaches and was using hydrocodone/APAP as needed for pain, but he did not tolerate that either. He resumed cabozantinib in August once his blood pressure was controlled. He had mild hyperkalemia at that time, so we increased his HCTZ to 2 tabs daily. He had persistent right shoulder pain, for which he was using Tylenol, then Advil. We added mirtazapine 15 mg at bedtime for insomnia in September. He discontinued cabozantinib in September due to progressive vitiligo. We recommended alternative therapy with axitinib 5 mg twice daily. Repeat CT chest, abdomen and pelvis in September prior to starting axitinib revealed a decrease in the pulmonary nodules, without evidence of new metastatic disease in the lungs or abdomen and pelvis. He was found to have an elevated uric acid and findings consistent with gouty arthritis, which he claims he has dealt with in the past. He had symptoms of shortness of breath, fatigue and hoarseness, as well as  a single episode of hemoptysis, so he stopped his axitinib on his own. Repeat CT imaging  in November revealed mild interval progression of bilateral pulmonary metastases, and slight interval increase in size of bilateral external iliac lymph nodes although they remain within normal limits for size. No evidence for recurrent in the nephrectomy bed was seen. We resumed axitinib at a lower dose of 3 mg twice daily but unfortunately, he experienced the same side effects, including shortness of breath and edema. CTA chest in late November was read as a very limited study due to respiratory motion artifact. No large or central pulmonary artery embolus identified. Known pulmonary metastatic disease and aortic atherosclerosis. Axitinib was discontinued. He eventually resumed hydrocodone/APAP 10/325 at bedtime as needed.  We then recommended treatment with everolimus 10 mg daily, which he started on January 15th. At his visit on February 1st, he was tolerating everolimus fairly well, except for pruritus.  He was given triamcinolone cream.  He had continued right shoulder pain, which was waking him up at night despite hydrocodone /APAP and Advil at bedtime, so we started him on MS Contin 15 mg every 12 hours.  We tried increasing the dose to 30 mg, but he felt this did not give him adequate relief and he has stopped this.  His anemia has been steadily worsening. Evaluation revealed a normal B12 and folate, no evidence of hemolysis, but equivocal results on his iron  studies.  He was given IV iron in the form of Venofer from March 15th through March 23rd.   He did undergo EGD and colonoscopy in March.  No source of bleeding was found.  H pylori was positive, so the patient was placed on triple therapy.  CT chest, abdomen and pelvis in April revealed mild progression of bilateral pulmonary nodules compatible with metastatic disease. Index anterior right upper lobe lesion measured previously at 1.4 cm is 1.8 cm.  An 8 mm  left lower lobe nodule measured previously is 7 mm.  The 1.7 cm right lower lobe nodule measured previously is 2.0 cm.  Index right lower lobe retro hilar nodule measured previously at 1.4 cm measures 2.1 cm.  No definite new pulmonary nodule or mass.  There was a similar appearance of the lytic lesion involving the right humeral head with associated joint effusion.  No evidence for local recurrence in the left nephrectomy bed. He continued Advil during the day with hydrocodone as needed. Denosumab was help in April due to mild hypocalcemia.   He was referred back to Dr. [name] for additional radiation therapy to the right humeral head.  He was also referred to Dr. Redmond Pulling, [facility], at [facility]:  Steven Peck is here today for repeat clinical assessment.  He states he has 1 more radiation treatment remaining, but cannot have that today as there is an issue with the accelerator. He states he continues everolimus daily without significant difficulty.  He states he had a mouth sore, which resolved with MBX solution.  He denies nausea, vomiting or diarrhea. He denies fevers or chills. He states his pain is slightly better controlled after radiation, yet he rates his pain 10/10 today. He is still not taking his MS Contin regularly every 12 hours. I advised him to do so, so the medication can be is his system around clock.  He is only using the hydrocodone about twice a week, as he feels "high"when he takes it. He did not tolerate tramadol for the same reason. His appetite is decreased. His weight has decreased 9 pounds over last 6 weeks.  He states he continues his triple therapy for H pylori.  He sees Dr. Lyda Jester again in a couple of weeks.  He states Dr. Redmond Pulling mentioned using cement to stabilize his bone, and he is scheduled to see Dr. Redmond Pulling again tomorrow.  He states this is his last week at work and he is applying for disability.  REVIEW OF SYSTEMS:  Review of Systems   Constitutional: Negative for appetite change, chills, fatigue, fever and unexpected weight change.  HENT:   Negative for lump/mass, mouth sores and sore throat.   Respiratory: Negative for cough and shortness of breath.   Cardiovascular: Negative for chest pain and leg swelling.  Gastrointestinal: Negative for abdominal pain, constipation, diarrhea, nausea and vomiting.  Genitourinary: Negative for difficulty urinating, dysuria, frequency and hematuria.   Musculoskeletal: Positive for arthralgias (Severe right shoulder pain). Negative for back pain and myalgias.  Skin: Negative for itching, rash and wound.  Neurological: Negative for dizziness, extremity weakness, headaches, light-headedness and numbness.  Hematological: Negative for adenopathy.  Psychiatric/Behavioral: Negative for depression and sleep disturbance. The patient is not nervous/anxious.      VITALS:  Blood pressure 113/77, pulse (!) 127, temperature 98.3 F (36.8 C), temperature source Oral, resp. rate 18, height _0  (1.778 m), weight 203 lb 3.2 oz (92.2 kg), SpO2 100 %.  Wt Readings from Last 3  Encounters:  10/20/20 203 lb 3.2 oz (92.2 kg)  09/04/20 212 lb 11.2 oz (96.5 kg)  08/25/20 220 lb 12.8 oz (100.2 kg)    Body mass index is 29.16 kg/m.  Performance status (ECOG): 2 - Symptomatic, <50% confined to bed  PHYSICAL EXAM:  Physical Exam Vitals and nursing note reviewed.  Constitutional:      General: He is not in acute distress.    Appearance: Normal appearance. He is normal weight.  HENT:     Head: Normocephalic and atraumatic.     Mouth/Throat:     Mouth: Mucous membranes are moist.     Pharynx: Oropharynx is clear. No oropharyngeal exudate or posterior oropharyngeal erythema.  Eyes:     General: No scleral icterus.    Extraocular Movements: Extraocular movements intact.     Conjunctiva/sclera: Conjunctivae normal.     Pupils: Pupils are equal, round, and reactive to light.  Cardiovascular:     Rate  and Rhythm: Regular rhythm. Tachycardia present.     Heart sounds: Normal heart sounds. No murmur heard. No friction rub. No gallop.   Pulmonary:     Effort: Pulmonary effort is normal.     Breath sounds: Normal breath sounds. No wheezing, rhonchi or rales.  Chest:  Breasts:     Right: No axillary adenopathy or supraclavicular adenopathy.     Left: No axillary adenopathy or supraclavicular adenopathy.    Abdominal:     General: Bowel sounds are normal. There is no distension.     Palpations: Abdomen is soft. There is no hepatomegaly, splenomegaly or mass.     Tenderness: There is no abdominal tenderness.  Musculoskeletal:        General: No tenderness.     Left shoulder: Bony tenderness present. Decreased range of motion.     Cervical back: Normal range of motion and neck supple. No tenderness.     Right lower leg: No edema.     Left lower leg: No edema.  Lymphadenopathy:     Cervical: No cervical adenopathy.     Upper Body:     Right upper body: No supraclavicular or axillary adenopathy.     Left upper body: No supraclavicular or axillary adenopathy.     Lower Body: No right inguinal adenopathy. No left inguinal adenopathy.  Skin:    General: Skin is warm and dry.     Coloration: Skin is not jaundiced.     Findings: No rash.  Neurological:     Mental Status: He is alert and oriented to person, place, and time.     Cranial Nerves: No cranial nerve deficit.  Psychiatric:        Mood and Affect: Mood normal.        Behavior: Behavior normal.        Thought Content: Thought content normal.    LABS:   CBC Latest Ref Rng & Units 10/20/2020 10/07/2020 09/04/2020  WBC - 6.4 4.3 4.9  Hemoglobin 13.5 - 17.5 8.4(A) 6.2(A) 7.5(A)  Hematocrit 41 - 53 27(A) 21(A) 25(A)  Platelets 150 - 399 466(A) 305 359   CMP Latest Ref Rng & Units 10/20/2020 10/07/2020 09/04/2020  Glucose 70 - 99 mg/dL - - -  BUN 4 - _0 Creatinine 0.6 - 1.3 1.3 1.1 1.1  Sodium 137 - 147 130(A) 138 136(A)   Potassium 3.4 - 5.3 4.4 4.2 4.0  Chloride 99 - 108 96(A) 107 105  CO2 13 - _1 20  Calcium 8.7 - 10.7 8.8 8.8 8.3(A)  Alkaline Phos 25 - 125 153(A) 114 100  AST 14 - 40 _0 ALT 10 - 40 _1 No results found for: CEA1 / No results found for: CEA1 No results found for: PSA1 No results found for: ELF810 No results found for: CAN125  No results found for: TOTALPROTELP, ALBUMINELP, A1GS, A2GS, BETS, BETA2SER, GAMS, MSPIKE, SPEI Lab Results  Component Value Date   TIBC 224 (L) 10/20/2020   TIBC 234 07/29/2020   FERRITIN 1,807 (H) 10/20/2020   FERRITIN 681 07/29/2020   IRONPCTSAT 8 (L) 10/20/2020   IRONPCTSAT TNP 07/29/2020   Lab Results  Component Value Date   LDH 302 (A) 07/29/2020    STUDIES:  No results found.    HISTORY:   Past Medical History:  Diagnosis Date  . Anemia   . Asthma    as a child  . Cancer ([place])    Left renal cancer  . Constipation   . Hypertension   . Malignant neoplasm of left kidney, except renal pelvis [facility])     Past Surgical History:  Procedure Laterality Date  . CYSTOSCOPY N/A 01/10/2019   Procedure: CYSTOSCOPY;  Surgeon: Alexis Frock, MD;  Location: [facility];  Service: Urology;  Laterality: N/A;  . NO PAST SURGERIES    . ROBOT ASSISTED LAPAROSCOPIC NEPHRECTOMY Left 01/10/2019   Procedure: XI ROBOTIC ASSISTED LAPAROSCOPIC NEPHRECTOMY;  Surgeon: Alexis Frock, MD;  Location: [facility];  Service: Urology;  Laterality: Left;  3 HRS    History reviewed. No pertinent family history.  Social History:  reports that he has never smoked. He has never used smokeless tobacco. He reports previous alcohol use. He reports that he does not use drugs.The patient is alone today.  Allergies:  Allergies  Allergen Reactions  . Hydrochlorothiazide Swelling  . Lisinopril Swelling    Angioedema of lips Angioedema of lips Angioedema of lips  . Tramadol     Other reaction(s): Other (See Comments)    Current Medications: Current  Outpatient Medications  Medication Sig Dispense Refill  . allopurinol (ZYLOPRIM) 100 MG tablet Take 100 mg by mouth daily.    Marland Kitchen amoxicillin (AMOXIL) 500 MG capsule Take 1,000 mg by mouth 2 (two) times daily.    . ANUCORT-HC 25 MG suppository Place 25 mg rectally 2 (two) times daily as needed.    . calcium citrate-vitamin D (CITRACAL+D) 315-200 MG-UNIT tablet Take 1 tablet by mouth 2 (two) times daily.    . Denosumab (XGEVA Nicholson) Inject into the skin.    Marland Kitchen everolimus (AFINITOR) 10 MG tablet Take 10 mg by mouth daily.    Marland Kitchen gabapentin (NEURONTIN) 300 MG capsule Take 1 capsule by mouth at bedtime. Patient not sure if taking this medication    . HYDROcodone-acetaminophen (NORCO) 10-325 MG tablet Take 1 tablet by mouth every 4 (four) hours as needed (for pain). 120 tablet 0  . hydrocortisone (ANUSOL-HC) 25 MG suppository Place 1 suppository (25 mg total) rectally 2 (two) times daily. 12 suppository 0  . hydrocortisone cream 1 % Apply 1 application topically 2 (two) times daily.    Marland Kitchen lactulose (CHRONULAC) 10 GM/15ML solution Take 10 g by mouth at bedtime as needed for mild constipation.    Marland Kitchen morphine (MS CONTIN) 30 MG 12 hr tablet Take 1 tablet (30 mg total) by mouth every 12 (twelve) hours. 60 tablet 0  . Multiple Vitamins-Minerals (MULTIVITAMIN WITH MINERALS) tablet Take 1 tablet by  mouth daily.    Marland Kitchen NARCAN 4 MG/0.1ML LIQD nasal spray kit SMARTSIG:1 Both Nares Daily PRN    . olmesartan (BENICAR) 20 MG tablet TAKE 1 TABLET BY MOUTH ONCE DAILY FOR 90 DAYS    . ondansetron (ZOFRAN) 4 MG tablet Take 4 mg by mouth every 8 (eight) hours as needed for nausea or vomiting.    . pantoprazole (PROTONIX) 40 MG tablet TAKE 1 BY MOUTH TWICE DAILY    . pregabalin (LYRICA) 25 MG capsule Take 1 capsule (25 mg total) by mouth 2 (two) times daily. 60 capsule 0  . prochlorperazine (COMPAZINE) 10 MG tablet Take 10 mg by mouth every 6 (six) hours as needed for nausea or vomiting.    . triamcinolone (KENALOG) 0.025 % cream  Apply 1 application topically 2 (two) times daily.    . vitamin B-12 (CYANOCOBALAMIN) 100 MCG tablet Take 100 mcg by mouth daily.     No current facility-administered medications for this visit.     ASSESSMENT & PLAN:   Assessment: 1. Stage III renal cell carcinoma status post radical left nephrectomy. Due to the stage and renal vein invasion, we recommended adjuvant therapy with sunitinib, which he could not tolerate due to severe uncontrolled hypertension , despite multiple medications.   2. Recurrent renal cell carcinoma with PET positive pulmonary nodules. He has had multiple lines of therapy, as he is not tolerated most oral chemotherapies. He is now on palliative everolimus 10 mg daily and is tolerating this without significant difficulty.  3. Destructive metastatic lesion in the right humeral head measuring 19 mm. He is completing another course of palliative radiation due to persistent pain. He was receiving denosumab every 4 weeks in treatment of his bony metastasis, but this was held in April due to hypocalcemia.  The patient states he has increased pain with the denosumab, so will hold off on further denosumab for now.   As above, advised him to take the MS Contin twice daily for best results. He is seeing Dr. Redmond Pulling again tomorrow for further treatment recommendations.  4.  Iron deficiency anemia, he received IV iron in the form of Venofer in March.  He required transfusion of 2 units of packed red blood cells 2 weeks ago when his hemoglobin dropped to 6.2.  Other than hemorrhoidal bleeding, he denies overt blood loss.  Recent EGD and colonoscopy did not reveal obvious source of blood loss.  H pylori was positive and he is being treated. The hemoglobin is better at 8.4, but his anemia remains microcytic and now he has mild thrombocytosis. Repeat iron studies did not reveal evidence of recurrent iron deficiency.  We will continue to monitor this.  5. Vitamin-D deficiency, he  continues vitamin-D 3 2000 international units daily.  6. Hypertension, which has been very difficult to control mainly due to the targeted therapies.  His blood pressure is in better range today.  7. Splenic lesion measuring 5.6 x 3.0 cm, which is suspicious for metastatic disease.   8.  Intermittent rectal bleeding, which he attributes to a hemorrhoid.   He has not tried the suppositories.  9.  Helicobacter Pylori infection, being treated triple therapy. He follows up with Dr. Lyda Jester in about 2 weeks.  Plan:    He knows to continue everolimus 10 mg daily.  He will see Dr. Redmond Pulling and Dr. Lyda Jester as scheduled.  I will plan to see him back in 4 weeks with a CBC and comprehensive metabolic panel for continued supportive care. The  patient understands the plans discussed today and is in agreement with them.  He knows to contact our office if he develops symptoms of worsening anemia or other concerns prior to his next appointment.      Marvia Pickles, PA-C

## 2020-10-21 DIAGNOSIS — M19011 Primary osteoarthritis, right shoulder: Secondary | ICD-10-CM | POA: Diagnosis not present

## 2020-10-21 DIAGNOSIS — C7951 Secondary malignant neoplasm of bone: Secondary | ICD-10-CM | POA: Diagnosis not present

## 2020-10-21 DIAGNOSIS — M7500 Adhesive capsulitis of unspecified shoulder: Secondary | ICD-10-CM | POA: Insufficient documentation

## 2020-10-22 DIAGNOSIS — C642 Malignant neoplasm of left kidney, except renal pelvis: Secondary | ICD-10-CM | POA: Diagnosis not present

## 2020-10-22 DIAGNOSIS — Z510 Encounter for antineoplastic radiation therapy: Secondary | ICD-10-CM | POA: Diagnosis not present

## 2020-10-23 ENCOUNTER — Encounter: Payer: Self-pay | Admitting: Oncology

## 2020-10-29 NOTE — Progress Notes (Signed)
Met with patient in my office, helped him complete his Disability Application on [email or web address]

## 2020-11-06 ENCOUNTER — Other Ambulatory Visit: Payer: Self-pay

## 2020-11-06 DIAGNOSIS — M25511 Pain in right shoulder: Secondary | ICD-10-CM

## 2020-11-06 DIAGNOSIS — C7951 Secondary malignant neoplasm of bone: Secondary | ICD-10-CM

## 2020-11-06 MED ORDER — HYDROCODONE-ACETAMINOPHEN 10-325 MG PO TABS: 1.0000 | ORAL_TABLET | ORAL | 0 refills | Status: DC | PRN

## 2020-11-12 NOTE — Telephone Encounter (Signed)
-----   Message from Marvia Pickles, PA-C sent at 11/12/2020  4:02 PM EDT ----- He saw [name], NP today for routine visit and reported 8/10 pain in right shoulder. Only taking the Norco. She asked about fentanyl. I explained we had him on morphine sulfate ER but he was not taking. She said he didn't like the way it made him feel, I thought he didn't like taking it while working, but he stopped working.  Also told her he had palliative radiation and was referred to [facility] to ortho onc for possible procedure. Would you call and see what his issue with the morphine is? Thanks

## 2020-11-13 ENCOUNTER — Other Ambulatory Visit: Payer: Self-pay | Admitting: Oncology

## 2020-11-16 ENCOUNTER — Encounter: Payer: Self-pay | Admitting: Oncology

## 2020-11-17 ENCOUNTER — Inpatient Hospital Stay (INDEPENDENT_AMBULATORY_CARE_PROVIDER_SITE_OTHER): Payer: BLUE CROSS/BLUE SHIELD | Admitting: Hematology and Oncology

## 2020-11-17 ENCOUNTER — Encounter: Payer: Self-pay | Admitting: Hematology and Oncology

## 2020-11-17 ENCOUNTER — Encounter: Payer: Self-pay | Admitting: Oncology

## 2020-11-17 ENCOUNTER — Other Ambulatory Visit: Payer: Self-pay | Admitting: Pharmacist

## 2020-11-17 ENCOUNTER — Inpatient Hospital Stay: Payer: BC Managed Care – PPO | Attending: Oncology

## 2020-11-17 VITALS — BP 146/81 | HR 98 | Temp 97.9°F | Resp 18 | Ht 70.0 in | Wt 205.0 lb

## 2020-11-17 DIAGNOSIS — C642 Malignant neoplasm of left kidney, except renal pelvis: Secondary | ICD-10-CM

## 2020-11-17 DIAGNOSIS — C7802 Secondary malignant neoplasm of left lung: Secondary | ICD-10-CM

## 2020-11-17 DIAGNOSIS — D649 Anemia, unspecified: Secondary | ICD-10-CM

## 2020-11-17 DIAGNOSIS — C7951 Secondary malignant neoplasm of bone: Secondary | ICD-10-CM

## 2020-11-17 DIAGNOSIS — C7801 Secondary malignant neoplasm of right lung: Secondary | ICD-10-CM

## 2020-11-17 LAB — HEPATIC FUNCTION PANEL
ALT: 15 (ref 10–40)
AST: 16 (ref 14–40)
Alkaline Phosphatase: 125 (ref 25–125)
Bilirubin, Total: 0.3

## 2020-11-17 LAB — BASIC METABOLIC PANEL
BUN: 15 (ref 4–21)
CO2: 22 (ref 13–22)
Chloride: 104 (ref 99–108)
Creatinine: 1 (ref 0.6–1.3)
Glucose: 113
Potassium: 4.3 (ref 3.4–5.3)
Sodium: 137 (ref 137–147)

## 2020-11-17 LAB — CBC AND DIFFERENTIAL
HCT: 27 — AB (ref 41–53)
Hemoglobin: 8.2 — AB (ref 13.5–17.5)
Neutrophils Absolute: 3.43
Platelets: 337 (ref 150–399)
WBC: 4.9

## 2020-11-17 LAB — CBC: RBC: 4 (ref 3.87–5.11)

## 2020-11-17 LAB — COMPREHENSIVE METABOLIC PANEL
Albumin: 3.7 (ref 3.5–5.0)
Calcium: 8.6 — AB (ref 8.7–10.7)

## 2020-11-17 MED ORDER — FENTANYL 25 MCG/HR TD PT72: 1.0000 | MEDICATED_PATCH | TRANSDERMAL | 0 refills | Status: DC

## 2020-11-17 NOTE — Progress Notes (Signed)
[facility]  [address] [place],  [place]  [id number] [phone]  Clinic Day:  11/17/2020  Referring physician: [name], NP   CHIEF COMPLAINT:  CC:  Recurrent kidney cancer with mets to bone and lung  Current Treatment:   Everolimus 10 mg daily   HISTORY OF PRESENT ILLNESS:  Steven Peck is a 55 y.o. 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  We began seeing him in May 2020 when he was referred by [name], NP, for a left renal mass, hypercalcemia and iron deficiency anemia.  He had transient hematuria.  CT abdomen and pelvis revealed marked generalized swelling of the left kidney with hyperdensity throughout the renal collecting system, renal pelvis, ureter and posterior bladder consistent with gross hemorrhage with a 6-7 cm infiltrating mass in the upper pole of the left kidney.  There was no evidence of venous invasion.  There were abnormal nodes in the retroperitoneum medial to the left kidney, up to 1.5 cm in diameter.  PET scan revealed malignancy of the kidney, but no definite evidence of metastasis.  There were two right pulmonary nodules measuring 3 mm.  MRI brain did not reveal any intracranial metastasis.  He underwent biopsy of the left kidney mass in June which revealed renal cell carcinoma.  He underwent left radical nephrectomy in August. 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  11 lymph nodes were negative for metastasis.  Tumor extended into the renal vein, but margins were clear.  He received IV iron in the form of Feraheme in July 2020.  Due to his high risk of recurrence, he was recommended for adjuvant oral chemotherapy with sunitinib 50 mg daily for 2 weeks on and 1 week off for total of 1 year.  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  He was evaluated in the emergency department and placed on  clonidine 0.2 mg 3 times daily.  The hypertension was uncontrolled with this as well as other medications.  Sunitinib was therefore discontinued after only 2 weeks of therapy.    He saw Dr. [name] for routine follow-up in February 2021.  At that time, CT abdomen and pelvis revealed basilar pulmonary nodules, which had increased in size and number, and were felt to be highly suspicious for metastatic disease.  There was a persistent splenic lesion, once again felt to be benign.  He was referred back to [place].  He had worsening anemia and iron panel and ferritin were equivocal, but a soluble transferrin was normal, so inconsistent with iron deficiency. PET scan revealed several of the pulmonary nodules to be hypermetabolic, consistent with metastatic disease.  There was no evidence of recurrent or metastatic disease within the abdomen and pelvis.  We recommended palliative immunotherapy with ipilimumab/nivolumab for 4 cycles to be followed by maintenance nivolumab.  He experienced severe pain of his right shoulder and down his right arm with associated swelling.  He is unable to fully lift his arm, and his right neck is also swollen. Ultrasound was negative for thrombosis.  His Port-A-Cath was then removed.  He was seen in clinic at the end of the day on July 1st., after he had been seen in the emergency department earlier in the day because of mild hemoptysis.  CTA chest with contrast revealed pulmonary metastases with mild progression from March 2021.  The apical segment right lower lobe  nodule was 17 mm, previously 10 mm.  Mild ground-glass opacity around a right lower lobe nodule measuring 22 mm, previously 14 mm.  It was unclear if this represents pseudoprogression or true progression.  He had persistentsevere right shoulder pain, despite tramadol.  Plain films of the right shoulderIn July revealed a large lucent lesion in the right humeral head with cortical destruction, as well as moderate degenerative changes of  the acromioclavicular and glenohumeral joints.  CT chest, abdomen and pelvis in July revealed a destructive metastatic lesion of the right proximal humerus measuring 19 mm.  Numerous pulmonary nodules were again observed, similar to June, but new and enlarging nodules overall since March 2021.  The splenic lesion measuring 5.6 x 3.0 cm, previously 4.7 x 5.0 cm, was indeterminate, but suspicious for metastasis despite lack of FDG uptake, as it has enlarged since previous imaging.  Nivolumab was discontinued.  He saw Dr. [name] and received palliative radiation to the right humeral head lesion in July.   He was placed on denosumab every 4 weeks for the bone metastasis.   Due to his progressive disease he was switched to palliative oral chemotherapy with cabozantinib 60 mg daily in July. Cabozantinib had to be held briefly due to uncontrolled hypertension.  He also had hypopigmentation of the perioral area and upper  back.  He discontinued tramadol as this was causing severe headaches and was using hydrocodone/APAP as needed for pain, but he did not tolerate that either.  He resumed cabozantinib in August once his blood pressure was controlled.  He had mild hyperkalemia at that time, so we increased his HCTZ to 2 tabs daily.  He had persistent right shoulder pain, for which he was using Tylenol, then Advil.  We added mirtazapine 15 mg at bedtime for insomnia in September.  He discontinued cabozantinib in September due to progressive vitiligo.  We recommended alternative therapy with axitinib 5 mg twice daily. Repeat CT chest, abdomen and pelvis in September prior to starting axitinib revealed a decrease in the pulmonary nodules, without evidence of new metastatic disease in the lungs or abdomen and pelvis.  He was found to have an elevated uric acid and findings consistent with gouty arthritis, which he claims he has dealt with in the past.  He had symptoms of shortness of breath, fatigue and hoarseness, as well as  a single episode of hemoptysis, so he stopped his axitinib on his own. Repeat CT imaging  in November revealed mild interval progression of bilateral pulmonary metastases, and slight interval increase in size of bilateral external iliac lymph nodes although they remain within normal limits for size.  No evidence for recurrent in the nephrectomy bed was seen.  We resumed axitinib at a lower dose of 3 mg twice daily but unfortunately, he experienced the same side effects, including shortness of breath and edema. CTA chest in late November was read as a very limited study due to respiratory motion artifact. No large or central pulmonary artery embolus identified. Known pulmonary metastatic disease and aortic atherosclerosis. Axitinib was discontinued. He eventually resumed hydrocodone/APAP 10/325 at bedtime as needed.   We then recommended treatment with everolimus 10 mg daily, which he started in January. He tolerated everolimus fairly well, except for pruritus, for which he was given triamcinolone cream.  He had continued right shoulder pain, which was waking him up at night despite hydrocodone /APAP and Advil at bedtime, so we started him on MS Contin 15 mg every 12 hours.  We tried increasing  the dose to 30 mg, but he felt this did not give him adequate relief and he has stopped this.  His anemia has been steadily worsening.  Evaluation revealed a normal B12 and folate, no evidence of hemolysis, but equivocal results on his iron studies.  He was given IV iron in the form of Venofer from March 15th through March 23rd.   He did undergo EGD and colonoscopy in March.  No source of bleeding was found.  H pylori was positive, so the patient was placed on triple therapy.  CT chest, abdomen and pelvis in April revealed mild progression of bilateral pulmonary nodules compatible with metastatic disease. Index anterior right upper lobe lesion measured previously at 1.4 cm is 1.8 cm.  An 8 mm left lower lobe nodule  measured previously is 7 mm.  The 1.7 cm right lower lobe nodule measured previously is 2.0 cm.  Index right lower lobe retro hilar nodule measured previously at 1.4 cm measures 2.1 cm.  No definite new pulmonary nodule or mass.  There was a similar appearance of the lytic lesion involving the right humeral head with associated joint effusion.  No evidence for local recurrence in the left nephrectomy bed. He continued Advil during the day with hydrocodone as needed. Denosumab was help in April due to mild hypocalcemia.   He was referred back to Dr. [name] for additional radiation therapy to the right humeral head and completed palliative radiation about 4 weeks ago. He was also referred to Dr. Redmond Pulling, Orthopedic Oncology, at [facility].  The patient told me he was seeing him last month.  He had persistent pain, but was not compliant with MS Contin every 12 hours, so we advised him to take this regularly for better pain control. Repeat iron studies in May were consistent with anemia of chronic disease.  INTERVAL HISTORY:  Steven Peck is here today for repeat clinical assessment.  He states he continues everolimus daily without significant difficulty.  He states he had occasional mouth sores, which resolve with MBX solution.  He denies nausea, vomiting or diarrhea. He denies fevers or chills. He states his pain is slightly better controlled after radiation.  He continues to rate his pain 10/10 today.  His primary care provider [name], NP contacted me last week about his uncontrolled pain, but we were unable to reach the patient. He states he took MS Contin 15 mg every 12 hours regularly for 3 weeks and it did not seem to help his pain, so he stopped it.  He is no longer working, so is able to take the hydrocodone/APAP more frequently and is taking 2 tablets 3 times a day.  He states this does help his pain.  I warned him about taking more than this  due to the Tylenol. He did not tolerate oxycodone very well. He  is hesitant to try another long acting narcotic. His appetite is improved. His weight has increased 2 pounds over last 4 weeks .   He states he did not complete the triple therapy for the H pylori as recommended , as sometimes he had difficulty swallowing and did not feel like taking the medication.  He sees Dr. Lyda Jester again for follow-up soon. He states he is seeing Dr. Redmond Pulling again tomorrow.  He requests a letter for disability.  REVIEW OF SYSTEMS:  Review of Systems  Constitutional:  Negative for appetite change, chills, fatigue, fever and unexpected weight change.  HENT:   Positive for mouth sores (intermittent) and trouble swallowing (  intermittent). Negative for lump/mass and sore throat.   Respiratory:  Negative for cough and shortness of breath.   Cardiovascular:  Negative for chest pain and leg swelling.  Gastrointestinal:  Negative for abdominal pain, constipation, diarrhea, nausea and vomiting.  Genitourinary:  Negative for difficulty urinating, dysuria, frequency and hematuria.   Musculoskeletal:  Positive for arthralgias (Severe right shoulder pain). Negative for back pain and myalgias.  Skin:  Negative for itching, rash and wound.  Neurological:  Negative for dizziness, extremity weakness, headaches, light-headedness and numbness.  Hematological:  Negative for adenopathy.  Psychiatric/Behavioral:  Negative for depression and sleep disturbance. The patient is not nervous/anxious.     VITALS:  Blood pressure (!) 146/81, pulse 98, temperature 97.9 F (36.6 C), temperature source Oral, resp. rate 18, height _0  (1.778 m), weight 205 lb (93 kg), SpO2 100 %.  Wt Readings from Last 3 Encounters:  11/17/20 205 lb (93 kg)  10/20/20 203 lb 3.2 oz (92.2 kg)  09/04/20 212 lb 11.2 oz (96.5 kg)    Body mass index is 29.41 kg/m.  Performance status (ECOG): 2 - Symptomatic, <50% confined to bed  PHYSICAL EXAM:  Physical Exam Vitals and nursing note reviewed.  Constitutional:       General: He is not in acute distress.    Appearance: Normal appearance. He is normal weight.  HENT:     Head: Normocephalic and atraumatic.     Mouth/Throat:     Mouth: Mucous membranes are moist.     Pharynx: Oropharynx is clear. No oropharyngeal exudate or posterior oropharyngeal erythema.  Eyes:     General: No scleral icterus.    Extraocular Movements: Extraocular movements intact.     Conjunctiva/sclera: Conjunctivae normal.     Pupils: Pupils are equal, round, and reactive to light.  Cardiovascular:     Rate and Rhythm: Regular rhythm. Tachycardia present.     Heart sounds: Normal heart sounds. No murmur heard.   No friction rub. No gallop.  Pulmonary:     Effort: Pulmonary effort is normal.     Breath sounds: Normal breath sounds. No wheezing, rhonchi or rales.  Chest:  Breasts:    Right: No axillary adenopathy or supraclavicular adenopathy.     Left: No axillary adenopathy or supraclavicular adenopathy.  Abdominal:     General: Bowel sounds are normal. There is no distension.     Palpations: Abdomen is soft. There is no hepatomegaly, splenomegaly or mass.     Tenderness: There is no abdominal tenderness.  Musculoskeletal:        General: No tenderness.     Left shoulder: Bony tenderness present. Decreased range of motion.     Cervical back: Normal range of motion and neck supple. No tenderness.     Right lower leg: No edema.     Left lower leg: No edema.  Lymphadenopathy:     Cervical: No cervical adenopathy.     Upper Body:     Right upper body: No supraclavicular or axillary adenopathy.     Left upper body: No supraclavicular or axillary adenopathy.     Lower Body: No right inguinal adenopathy. No left inguinal adenopathy.  Skin:    General: Skin is warm and dry.     Coloration: Skin is not jaundiced.     Findings: No rash.  Neurological:     Mental Status: He is alert and oriented to person, place, and time.     Cranial Nerves: No cranial nerve deficit.   Psychiatric:  Mood and Affect: Mood normal.        Behavior: Behavior normal.        Thought Content: Thought content normal.   LABS:   CBC Latest Ref Rng & Units 11/17/2020 10/20/2020 10/07/2020  WBC - 4.9 6.4 4.3  Hemoglobin 13.5 - 17.5 8.2(A) 8.4(A) 6.2(A)  Hematocrit 41 - 53 27(A) 27(A) 21(A)  Platelets 150 - 399 337 466(A) 305   CMP Latest Ref Rng & Units 11/17/2020 10/20/2020 10/07/2020  Glucose 70 - 99 mg/dL - - -  BUN 4 - _0 Creatinine 0.6 - 1.3 1.0 1.3 1.1  Sodium 137 - 147 137 130(A) 138  Potassium 3.4 - 5.3 4.3 4.4 4.2  Chloride 99 - 108 104 96(A) 107  CO2 13 - _1 Calcium 8.7 - 10.7 8.6(A) 8.8 8.8  Alkaline Phos 25 - 125 125 153(A) 114  AST 14 - 40 _2 ALT 10 - 40 _3 No results found for: CEA1 / No results found for: CEA1 No results found for: PSA1 No results found for: HCW237 No results found for: CAN125  No results found for: TOTALPROTELP, ALBUMINELP, A1GS, A2GS, BETS, BETA2SER, GAMS, MSPIKE, SPEI Lab Results  Component Value Date   TIBC 224 (L) 10/20/2020   TIBC 234 07/29/2020   FERRITIN 1,807 (H) 10/20/2020   FERRITIN 681 07/29/2020   IRONPCTSAT 8 (L) 10/20/2020   IRONPCTSAT TNP 07/29/2020   Lab Results  Component Value Date   LDH 302 (A) 07/29/2020    STUDIES:  No results found.    HISTORY:   Past Medical History:  Diagnosis Date   Anemia    Asthma    as a child   Cancer ([place])    Left renal cancer   Constipation    Hypertension    Malignant neoplasm of left kidney, except renal pelvis [facility])     Past Surgical History:  Procedure Laterality Date   CYSTOSCOPY N/A 01/10/2019   Procedure: CYSTOSCOPY;  Surgeon: Alexis Frock, MD;  Location: [facility];  Service: Urology;  Laterality: N/A;   NO PAST SURGERIES     ROBOT ASSISTED LAPAROSCOPIC NEPHRECTOMY Left 01/10/2019   Procedure: XI ROBOTIC ASSISTED LAPAROSCOPIC NEPHRECTOMY;  Surgeon: Alexis Frock, MD;  Location: [facility];  Service: Urology;   Laterality: Left;  3 HRS    History reviewed. No pertinent family history.  Social History:  reports that he has never smoked. He has never used smokeless tobacco. He reports previous alcohol use. He reports that he does not use drugs.The patient is alone today.  Allergies:  Allergies  Allergen Reactions   Hydrochlorothiazide Swelling   Lisinopril Swelling    Angioedema of lips Angioedema of lips Angioedema of lips   Tramadol     Other reaction(s): Other (See Comments)    Current Medications: Current Outpatient Medications  Medication Sig Dispense Refill   fentaNYL (DURAGESIC) 25 MCG/HR Place 1 patch onto the skin every 3 (three) days. 10 patch 0   AFINITOR 10 MG tablet TAKE 1 TABLET DAILY 30 tablet 5   allopurinol (ZYLOPRIM) 100 MG tablet Take 100 mg by mouth daily.     ANUCORT-HC 25 MG suppository Place 25 mg rectally 2 (two) times daily as needed.     calcium citrate-vitamin D (CITRACAL+D) 315-200 MG-UNIT tablet Take 1 tablet by mouth 2 (two) times daily.     Denosumab (XGEVA [place]) Inject into the skin.  gabapentin (NEURONTIN) 300 MG capsule Take 1 capsule by mouth at bedtime. Patient not sure if taking this medication     HYDROcodone-acetaminophen (NORCO) 10-325 MG tablet Take 1 tablet by mouth every 4 (four) hours as needed (for pain). 120 tablet 0   hydrocortisone (ANUSOL-HC) 25 MG suppository Place 1 suppository (25 mg total) rectally 2 (two) times daily. 12 suppository 0   hydrocortisone cream 1 % Apply 1 application topically 2 (two) times daily.     lactulose (CHRONULAC) 10 GM/15ML solution Take 10 g by mouth at bedtime as needed for mild constipation.     Multiple Vitamins-Minerals (MULTIVITAMIN WITH MINERALS) tablet Take 1 tablet by mouth daily.     NARCAN 4 MG/0.1ML LIQD nasal spray kit SMARTSIG:1 Both Nares Daily PRN     olmesartan (BENICAR) 20 MG tablet TAKE 1 TABLET BY MOUTH ONCE DAILY FOR 90 DAYS     ondansetron (ZOFRAN) 4 MG tablet Take 4 mg by mouth every 8  (eight) hours as needed for nausea or vomiting.     pantoprazole (PROTONIX) 40 MG tablet TAKE 1 BY MOUTH TWICE DAILY     pregabalin (LYRICA) 25 MG capsule Take 1 capsule (25 mg total) by mouth 2 (two) times daily. 60 capsule 0   prochlorperazine (COMPAZINE) 10 MG tablet Take 10 mg by mouth every 6 (six) hours as needed for nausea or vomiting.     triamcinolone (KENALOG) 0.025 % cream Apply 1 application topically 2 (two) times daily.     vitamin B-12 (CYANOCOBALAMIN) 100 MCG tablet Take 100 mcg by mouth daily.     No current facility-administered medications for this visit.     ASSESSMENT & PLAN:   Assessment: 1. Stage III renal cell carcinoma status post radical left nephrectomy.  Due to the stage and renal vein invasion, we recommended adjuvant therapy with sunitinib, which he could not tolerate due to severe uncontrolled hypertension , despite multiple medications.    2. Recurrent renal cell carcinoma with PET positive pulmonary nodules.  He has had multiple lines of therapy, as he is not tolerated most oral chemotherapies. He is now on palliative everolimus 10 mg daily and is tolerating this without significant difficulty.   3. Destructive metastatic lesion in the right humeral head measuring 19 mm.  He is completing another course of palliative radiation due to persistent pain and decreased range of motion. He was receiving denosumab every 4 weeks in treatment of his bony metastasis, but this was held in April due to hypocalcemia.  The patient states he has increased pain with the denosumab, so will hold off on further denosumab for now.    He felt MS Contin did not help his pain.  I will place him on fentanyl 25 micrograms/hour and titrate up as needed. He is seeing Dr. Redmond Pulling again tomorrow for further treatment recommendations. Due to severe, uncontrolled pain and decreased range of motion of his right shoulder, he is total and completely disabled. A letter was written for disability.    4.  Iron deficiency anemia, he received IV iron in the form of Venofer in March.  He required transfusion of 2 units of packed red blood cells 2 weeks ago when his hemoglobin dropped to 6.2.  Other than hemorrhoidal bleeding, he denies overt blood loss.  Recent EGD and colonoscopy did not reveal obvious source of blood loss.  H pylori was positive and he is being treated. The hemoglobin is fairly stable, but his anemia remains microcytic. Repeat iron studies in  May did not reveal evidence of recurrent iron deficiency.  We will continue to monitor this.   5. Vitamin-D deficiency, he continues vitamin-D 3 2000 international units daily.   6. Hypertension, which has been very difficult to control mainly due to the targeted therapies.  His blood pressure is in better range today.   7. Splenic lesion measuring 5.6 x 3.0 cm, which is suspicious for metastatic disease.    8.  Intermittent rectal bleeding, which he attributes to hemorrhoids, which were appreciated on colonoscopy in March.     9.  Helicobacter Pylori infection, treated triple therapy, which he states he did not complete in timely manner. He follows up with Dr. Lyda Jester again soon.   Plan:    He knows to continue everolimus 10 mg daily.   He will start fentanyl 25 micrograms/hour and change the patch every 72 hours i.e. 3 days. If this is ineffective in controlling his pain, he knows to contact us. He will see Dr. Redmond Pulling as scheduled.  I will plan to see him back in 4 weeks with a CBC, comprehensive metabolic panel, iron panel, ferritin and CT chest to reassess his disease baseline. The patient understands the plans discussed today and is in agreement with them.  He knows to contact our office if he develops symptoms of worsening anemia or other concerns prior to his next appointment.      Marvia Pickles, PA-C

## 2020-11-18 NOTE — Progress Notes (Signed)
Patient called me today and stated that his insurance had ended 06/10. I have already helped him compete the disability application. He does have a scan coming up next month, he is going to come in next week for [place] to complete the financial assistance application for [employer] while we wait to hear about his Disability and Medicaid. He will be in sometime today to speak with me about the [employer] to help with the cost of his medication.

## 2020-11-20 ENCOUNTER — Other Ambulatory Visit (HOSPITAL_COMMUNITY): Payer: Self-pay

## 2020-11-20 ENCOUNTER — Encounter: Payer: Self-pay | Admitting: Oncology

## 2020-11-20 ENCOUNTER — Other Ambulatory Visit: Payer: Self-pay | Admitting: Hematology and Oncology

## 2020-11-20 MED ORDER — FENTANYL 25 MCG/HR TD PT72
1.0000 | MEDICATED_PATCH | TRANSDERMAL | 0 refills | Status: DC
  Filled 2020-11-20: qty 10, 30d supply, fill #0

## 2020-11-21 ENCOUNTER — Other Ambulatory Visit (HOSPITAL_COMMUNITY): Payer: Self-pay

## 2020-11-21 ENCOUNTER — Encounter: Payer: Self-pay | Admitting: Oncology

## 2020-11-21 NOTE — Progress Notes (Signed)
Patient lost his insurance 11/07/2020, helped him fill out applications for financial assistance for [facility] and [facility] and submitted both. He has applied for disability and Medicaid as well. Enrolled him into the [employer] to help with the cost of medication. Also had him sign paperwork for free Afinitor through [employer] Patient Assistance.  Enrolled him into [employer] co-pay assistance to help with the cost of Xgeva when he had [employer].

## 2020-12-02 NOTE — Progress Notes (Signed)
Patient was approved for free Afinitor through [place] until 05/30/2021. ID# [id number] Called and informed patient he was approved, medication is ready to be scheduled for delivery. Provided him with the number [phone] option 4, to call and schedule shipment. Told him if he has any problems to reach out to me.

## 2020-12-05 DIAGNOSIS — M109 Gout, unspecified: Secondary | ICD-10-CM | POA: Insufficient documentation

## 2020-12-05 DIAGNOSIS — I10 Essential (primary) hypertension: Secondary | ICD-10-CM | POA: Insufficient documentation

## 2020-12-10 ENCOUNTER — Encounter: Payer: Self-pay | Admitting: Oncology

## 2020-12-11 ENCOUNTER — Other Ambulatory Visit: Payer: Self-pay

## 2020-12-11 DIAGNOSIS — M25511 Pain in right shoulder: Secondary | ICD-10-CM

## 2020-12-11 DIAGNOSIS — C7951 Secondary malignant neoplasm of bone: Secondary | ICD-10-CM

## 2020-12-11 MED ORDER — HYDROCODONE-ACETAMINOPHEN 10-325 MG PO TABS: 1.0000 | ORAL_TABLET | ORAL | 0 refills | Status: DC | PRN

## 2020-12-11 NOTE — Progress Notes (Signed)
[facility]  [address] [place],  [place]  [address] [phone]  Clinic Day:  12/17/2020  Referring physician: [name], NP  This document serves as a record of services personally performed by Steven Poisson, MD. It was created on their behalf by [name],[name], a trained medical scribe. The creation of this record is based on the scribe's personal observations and the provider's statements to them.  CHIEF COMPLAINT:  CC:  Recurrent kidney cancer with mets to bone and lung  Current Treatment:   Everolimus 10 mg daily   HISTORY OF PRESENT ILLNESS:  Steven Peck is a 55 y.o. 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  We began seeing him in May 2020 when he was referred by [name], NP, for a left renal mass, hypercalcemia and iron deficiency anemia.  He had transient hematuria.  CT abdomen and pelvis revealed marked generalized swelling of the left kidney with hyperdensity throughout the renal collecting system, renal pelvis, ureter and posterior bladder consistent with gross hemorrhage with a 6-7 cm infiltrating mass in the upper pole of the left kidney.  There was no evidence of venous invasion.  There were abnormal nodes in the retroperitoneum medial to the left kidney, up to 1.5 cm in diameter.  PET scan revealed malignancy of the kidney, but no definite evidence of metastasis.  There were two right pulmonary nodules measuring 3 mm.  MRI brain did not reveal any intracranial metastasis.  He underwent biopsy of the left kidney mass in June which revealed renal cell carcinoma.  He underwent left radical nephrectomy in August. 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  11 lymph nodes were negative for metastasis.  Tumor extended into the renal vein, but margins were clear.  He received IV iron in the form of Feraheme in July 2020.  Due to his high risk of recurrence, he was  recommended for adjuvant oral chemotherapy with sunitinib 50 mg daily for 2 weeks on and 1 week off for total of 1 year.  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  He was evaluated in the emergency department and placed on clonidine 0.2 mg 3 times daily.  The hypertension was uncontrolled with this as well as other medications.  Sunitinib was therefore discontinued after only 2 weeks of therapy.    He saw Dr. [name] for routine follow-up in February 2021.  At that time, CT abdomen and pelvis revealed basilar pulmonary nodules, which had increased in size and number, and were felt to be highly suspicious for metastatic disease.  There was a persistent splenic lesion, once again felt to be benign.  He was referred back to [place].  He had worsening anemia and iron panel and ferritin were equivocal, but a soluble transferrin was normal, so inconsistent with iron deficiency. PET scan revealed several of the pulmonary nodules to be hypermetabolic, consistent with metastatic disease.  There was no evidence of recurrent or metastatic disease within the abdomen and pelvis.  We recommended palliative immunotherapy with ipilimumab/nivolumab for 4 cycles to be followed by maintenance nivolumab.  He experienced severe pain of his right shoulder and down his right arm with associated swelling.  He is unable to fully lift his arm, and his right neck is also swollen. Ultrasound was negative for thrombosis.  His Port-A-Cath was then removed.  He was seen in clinic at the  end of the day on July 1st., after he had been seen in the emergency department earlier in the day because of mild hemoptysis.  CTA chest with contrast revealed pulmonary metastases with mild progression from March 2021.  The apical segment right lower lobe nodule was 17 mm, previously 10 mm.  Mild ground-glass opacity around a right lower lobe nodule measuring 22 mm, previously 14 mm.  It was unclear if  this represents pseudoprogression or true progression.  He had persistentsevere right shoulder pain, despite tramadol.  Plain films of the right shoulderIn July revealed a large lucent lesion in the right humeral head with cortical destruction, as well as moderate degenerative changes of the acromioclavicular and glenohumeral joints.  CT chest, abdomen and pelvis in July revealed a destructive metastatic lesion of the right proximal humerus measuring 19 mm.  Numerous pulmonary nodules were again observed, similar to June, but new and enlarging nodules overall since March 2021.  The splenic lesion measuring 5.6 x 3.0 cm, previously 4.7 x 5.0 cm, was indeterminate, but suspicious for metastasis despite lack of FDG uptake, as it has enlarged since previous imaging.  Nivolumab was discontinued.  He saw Dr. [name] and received palliative radiation to the right humeral head lesion in July.   He was placed on denosumab every 4 weeks for the bone metastasis.   Due to his progressive disease he was switched to palliative oral chemotherapy with cabozantinib 60 mg daily in July. Cabozantinib had to be held briefly due to uncontrolled hypertension.  He also had hypopigmentation of the perioral area and upper  back.  He discontinued tramadol as this was causing severe headaches and was using hydrocodone/APAP as needed for pain, but he did not tolerate that either.  He resumed cabozantinib in August once his blood pressure was controlled.  He had mild hyperkalemia at that time, so we increased his HCTZ to 2 tabs daily.  He had persistent right shoulder pain, for which he was using Tylenol, then Advil.  We added mirtazapine 15 mg at bedtime for insomnia in September.  He discontinued cabozantinib in September due to progressive vitiligo.  We recommended alternative therapy with axitinib 5 mg twice daily. Repeat CT chest, abdomen and pelvis in September prior to starting axitinib revealed a decrease in the pulmonary nodules,  without evidence of new metastatic disease in the lungs or abdomen and pelvis.  He was found to have an elevated uric acid and findings consistent with gouty arthritis, which he claims he has dealt with in the past.  He had symptoms of shortness of breath, fatigue and hoarseness, as well as a single episode of hemoptysis, so he stopped his axitinib on his own. Repeat CT imaging  in November revealed mild interval progression of bilateral pulmonary metastases, and slight interval increase in size of bilateral external iliac lymph nodes although they remain within normal limits for size.  No evidence for recurrent in the nephrectomy bed was seen.  We resumed axitinib at a lower dose of 3 mg twice daily but unfortunately, he experienced the same side effects, including shortness of breath and edema. CTA chest in late November was read as a very limited study due to respiratory motion artifact. No large or central pulmonary artery embolus identified. Known pulmonary metastatic disease and aortic atherosclerosis. Axitinib was discontinued. He eventually resumed hydrocodone/APAP 10/325 at bedtime as needed.   We then recommended treatment with everolimus 10 mg daily, which he started in January 2022. He tolerated everolimus fairly well, except  for pruritus, for which he was given triamcinolone cream.  He had continued right shoulder pain, which was waking him up at night despite hydrocodone /APAP and Advil at bedtime, so we started him on MS Contin 15 mg every 12 hours.  We tried increasing the dose to 30 mg, but he felt this did not give him adequate relief and he has stopped this.  His anemia has been steadily worsening.  Evaluation revealed a normal B12 and folate, no evidence of hemolysis, but equivocal results on his iron studies.  He was given IV iron in the form of Venofer from March 15th through March 23rd.   He did undergo EGD and colonoscopy in March.  No source of bleeding was found.  H pylori was positive,  so the patient was placed on triple therapy.  CT chest, abdomen and pelvis in April revealed mild progression of bilateral pulmonary nodules compatible with metastatic disease. Index anterior right upper lobe lesion measured previously at 1.4 cm is 1.8 cm.  An 8 mm left lower lobe nodule measured previously is 7 mm.  The 1.7 cm right lower lobe nodule measured previously is 2.0 cm.  Index right lower lobe retro hilar nodule measured previously at 1.4 cm measures 2.1 cm.  No definite new pulmonary nodule or mass.  There was a similar appearance of the lytic lesion involving the right humeral head with associated joint effusion.  No evidence for local recurrence in the left nephrectomy bed. He continued Advil during the day with hydrocodone as needed. Denosumab was held in April due to mild hypocalcemia.  He was referred back to Dr. [name] for additional radiation therapy to the right humeral head and completed palliative radiation about 4 weeks ago. He was also referred to Dr. Redmond Pulling, Orthopedic Oncology, at [facility] and he has recommended surgery.  He states he had occasional mouth sores, which resolve with MBX solution.  He states he took MS Contin 15 mg every 12 hours regularly for 3 weeks and it did not seem to help his pain, so he stopped it.   He is no longer working, so is able to take the hydrocodone/APAP more frequently and is taking 2 tablets 3 times a day.  He states this does help his pain.  I warned him about taking more than this  due to the Tylenol. He did not tolerate oxycodone very well. He is hesitant to try another long acting narcotic.   INTERVAL HISTORY:  [place] is here for follow up and to review recent imaging results.  CT chest from July 18th revealed the bilateral pulmonary metastases remain fairly stable.  A few of the previously non indexed pulmonary nodules have increased in size and one has decreased from 1.8 to 1.5 cm.  No definite new suspicious pulmonary nodules or masses.  Similar  size of the 4.1 cm ascending aortic aneurysm.  He continues to hold everolimus in view of his upcoming procedure.  He is scheduled for surgery on the right shoulder on July 29th.  He continues to have pain, which he rates as a 9/10 today and decreased range of motion of the right upper extremity.  He has not been able to pick up hydrocodone 10/325 as many of the local pharmacies don't have this in stock.  Blood counts and chemistries are unremarkable except for a hemoglobin of 8.6, slightly improved, with an MCV of 68.  Iron was 36 with a TIBC of 305 for a percent saturation of 11.8%.  Ferritin was  723, likely reactive.  We will schedule him for IV Feraheme, 2 doses, prior to his upcoming surgery.    His  appetite is good, and he has gained 6 and 1/2 pounds since his last visit.  He denies fever, chills or other signs of infection.  He denies nausea, vomiting, bowel issues, or abdominal pain.  He denies sore throat, cough, dyspnea, or chest pain.  REVIEW OF SYSTEMS:  Review of Systems  Constitutional: Negative.  Negative for appetite change, chills, fatigue, fever and unexpected weight change.  HENT:  Negative.    Eyes: Negative.   Respiratory: Negative.  Negative for chest tightness, cough, hemoptysis, shortness of breath and wheezing.   Cardiovascular: Negative.  Negative for chest pain, leg swelling and palpitations.  Gastrointestinal: Negative.  Negative for abdominal distention, abdominal pain, blood in stool, constipation, diarrhea, nausea and vomiting.  Endocrine: Negative.   Genitourinary: Negative.  Negative for difficulty urinating, dysuria, frequency and hematuria.   Musculoskeletal:  Negative for arthralgias, back pain, flank pain, gait problem and myalgias.       Right shoulder pain, severe, and decreased range of motion  Skin: Negative.   Neurological: Negative.  Negative for dizziness, extremity weakness, gait problem, headaches, light-headedness, numbness, seizures and speech  difficulty.  Hematological: Negative.   Psychiatric/Behavioral: Negative.  Negative for depression and sleep disturbance. The patient is not nervous/anxious.   All other systems reviewed and are negative.   VITALS:  Blood pressure 137/90, pulse (!) 106, temperature 98.7 F (37.1 C), temperature source Oral, resp. rate 20, height $RemoveBe'5\' 10"'XqYzipkii$  (1.778 m), weight 211 lb 8 oz (95.9 kg), SpO2 100 %.  Wt Readings from Last 3 Encounters:  12/17/20 211 lb 8 oz (95.9 kg)  11/17/20 205 lb (93 kg)  10/20/20 203 lb 3.2 oz (92.2 kg)    Body mass index is 30.35 kg/m.  Performance status (ECOG): 2 - Symptomatic, <50% confined to bed  PHYSICAL EXAM:  Physical Exam Constitutional:      General: He is not in acute distress.    Appearance: Normal appearance. He is normal weight.  HENT:     Head: Normocephalic and atraumatic.  Eyes:     General: No scleral icterus.    Extraocular Movements: Extraocular movements intact.     Conjunctiva/sclera: Conjunctivae normal.     Pupils: Pupils are equal, round, and reactive to light.  Cardiovascular:     Rate and Rhythm: Normal rate and regular rhythm.     Pulses: Normal pulses.     Heart sounds: Normal heart sounds. No murmur heard.   No friction rub. No gallop.  Pulmonary:     Effort: Pulmonary effort is normal. No respiratory distress.     Breath sounds: Normal breath sounds.  Abdominal:     General: Bowel sounds are normal. There is no distension.     Palpations: Abdomen is soft. There is no hepatomegaly, splenomegaly or mass.     Tenderness: There is no abdominal tenderness.  Musculoskeletal:        General: Normal range of motion.     Cervical back: Normal range of motion and neck supple.     Right lower leg: No edema.     Left lower leg: No edema.  Lymphadenopathy:     Cervical: No cervical adenopathy.  Skin:    General: Skin is warm and dry.  Neurological:     General: No focal deficit present.     Mental Status: He is alert and oriented to  person,  place, and time. Mental status is at baseline.  Psychiatric:        Mood and Affect: Mood normal.        Behavior: Behavior normal.        Thought Content: Thought content normal.        Judgment: Judgment normal.   LABS:   CBC Latest Ref Rng & Units 12/15/2020 11/17/2020 10/20/2020  WBC - 4.6 4.9 6.4  Hemoglobin 13.5 - 17.5 8.6(A) 8.2(A) 8.4(A)  Hematocrit 41 - 53 28(A) 27(A) 27(A)  Platelets 150 - 399 300 337 466(A)   CMP Latest Ref Rng & Units 12/15/2020 11/17/2020 10/20/2020  Glucose 70 - 99 mg/dL - - -  BUN 4 - $R'21 19 15 17  'Pu$ Creatinine 0.6 - 1.3 0.9 1.0 1.3  Sodium 137 - 147 139 137 130(A)  Potassium 3.4 - 5.3 4.8 4.3 4.4  Chloride 99 - 108 104 104 96(A)  CO2 13 - 22 26(A) 22 22  Calcium 8.7 - 10.7 9.1 8.6(A) 8.8  Alkaline Phos 25 - 125 97 125 153(A)  AST 14 - 40 $Re'21 16 21  'Ttf$ ALT 10 - 40 $Re'14 15 19    'cfe$ Lab Results  Component Value Date   TIBC 224 (L) 10/20/2020   TIBC 234 07/29/2020   FERRITIN 1,807 (H) 10/20/2020   FERRITIN 681 07/29/2020   IRONPCTSAT 8 (L) 10/20/2020   IRONPCTSAT TNP 07/29/2020   Lab Results  Component Value Date   LDH 302 (A) 07/29/2020    STUDIES:  No results found.   EXAM: 12/15/2020 CT CHEST WITH CONTRAST  TECHNIQUE: Multidetector CT imaging of the chest was performed during intravenous contrast administration.  CONTRAST:  60 cc of Isovue 370  COMPARISON:  Multiple priors including most recent CT 09/02/2020  FINDINGS: Cardiovascular: Aortic atherosclerosis. Similar size of the ascending aortic aneurysm measuring 4.1 cm previously 4.0 cm. No central pulmonary embolus. Normal size heart. No significant pericardial effusion/thickening.  Mediastinum/Nodes: No discrete thyroid nodule. No pathologically enlarged mediastinal, hilar or axillary lymph nodes. The trachea and esophagus are grossly unremarkable.  Lungs/Pleura: Scattered bilateral pulmonary nodules are again visualized. Index lesions are as follows:  - Sub pleural  anterior right upper lobe lesion measures 1.5 cm on image 58/4 previously 1.8 cm.  - Left lower lobe pulmonary nodule measures 7 mm on image 49/4, unchanged.  - Right lower lobe pulmonary nodule measures 2 cm on image 65/4, unchanged.  - Right lower lobe retro hilar nodule measures 2.1 cm on image 54/4, unchanged but with increased adjacent ground-glass opacities and tree in bud nodularity compatible with a degree of airway involvement/impaction.  - A few of the previously non indexed pulmonary nodules have increased in size. For instance a nodule in the paramedian left upper lobe now measures 7 mm on image 51/4 previously 4 mm, a right lower lobe pulmonary nodule on image 66/4 which now measures 9 mm previously 6 mm as well as a 9 mm right middle lobe pulmonary nodule on image 68/4 which previously measured 7 mm. No definite new suspicious pulmonary nodules or masses.  No pleural effusion.  No pneumothorax.  Upper Abdomen: No acute abnormality.  Musculoskeletal: No aggressive lytic or blastic lesion of bone.  IMPRESSION: 1. Overall interval progression of the bilateral pulmonary metastases. No definite new suspicious pulmonary nodules or masses. 2. Similar size of the 4.1 cm ascending aortic aneurysm. Continued surveillance on follow-up imaging in this oncologic patient is sufficient for monitoring. This recommendation follows 2010 ACCF/AHA/AATS/ACR/ASA/SCA/SCAI/SIR/STS/SVM Guidelines for the  Diagnosis and Management of Patients with Thoracic Aortic Disease. Circulation. 2010; 121: C623-J628. Aortic aneurysm NOS (ICD10-I71.9) 3.  Aortic Atherosclerosis (ICD10-I70.0).   HISTORY:   Allergies:  Allergies  Allergen Reactions   Hydrochlorothiazide Swelling   Lisinopril Swelling    Angioedema of lips Angioedema of lips Angioedema of lips   Tramadol     Other reaction(s): Other (See Comments)    Current Medications: Current Outpatient Medications  Medication Sig  Dispense Refill   AFINITOR 10 MG tablet TAKE 1 TABLET DAILY 30 tablet 5   allopurinol (ZYLOPRIM) 100 MG tablet Take 100 mg by mouth daily.     ANUCORT-HC 25 MG suppository Place 25 mg rectally 2 (two) times daily as needed.     calcium citrate-vitamin D (CITRACAL+D) 315-200 MG-UNIT tablet Take 1 tablet by mouth 2 (two) times daily.     Denosumab (XGEVA [place]) Inject into the skin.     fentaNYL (DURAGESIC) 25 MCG/HR Place 1 patch onto the skin every 3 (three) days. 10 patch 0   gabapentin (NEURONTIN) 300 MG capsule Take 1 capsule by mouth at bedtime. Patient not sure if taking this medication     HYDROcodone-acetaminophen (NORCO) 10-325 MG tablet Take 1 tablet by mouth every 4 (four) hours as needed (for pain). 120 tablet 0   hydrocortisone (ANUSOL-HC) 25 MG suppository Place 1 suppository (25 mg total) rectally 2 (two) times daily. 12 suppository 0   hydrocortisone cream 1 % Apply 1 application topically 2 (two) times daily.     lactulose (CHRONULAC) 10 GM/15ML solution Take 10 g by mouth at bedtime as needed for mild constipation.     Multiple Vitamins-Minerals (MULTIVITAMIN WITH MINERALS) tablet Take 1 tablet by mouth daily.     NARCAN 4 MG/0.1ML LIQD nasal spray kit SMARTSIG:1 Both Nares Daily PRN     olmesartan (BENICAR) 20 MG tablet TAKE 1 TABLET BY MOUTH ONCE DAILY FOR 90 DAYS     Omega-3 1000 MG CAPS Take by mouth.     ondansetron (ZOFRAN) 4 MG tablet Take 4 mg by mouth every 8 (eight) hours as needed for nausea or vomiting.     pantoprazole (PROTONIX) 40 MG tablet TAKE 1 BY MOUTH TWICE DAILY     pregabalin (LYRICA) 25 MG capsule Take 1 capsule (25 mg total) by mouth 2 (two) times daily. 60 capsule 0   prochlorperazine (COMPAZINE) 10 MG tablet Take 10 mg by mouth every 6 (six) hours as needed for nausea or vomiting.     triamcinolone (KENALOG) 0.025 % cream Apply 1 application topically 2 (two) times daily.     vitamin B-12 (CYANOCOBALAMIN) 100 MCG tablet Take 100 mcg by mouth daily.      No current facility-administered medications for this visit.     ASSESSMENT & PLAN:   Assessment: 1. Stage III renal cell carcinoma status post radical left nephrectomy.  Due to the stage and renal vein invasion, we recommended adjuvant therapy with sunitinib, which he could not tolerate due to severe uncontrolled hypertension , despite multiple medications.    2. Recurrent renal cell carcinoma with PET positive pulmonary nodules.  He has had multiple lines of therapy, as he is not tolerated most oral chemotherapies. He is now on palliative everolimus 10 mg daily and is tolerating this without significant difficulty.  This appears to be keeping his disease relatively stable.  This is currently on hold due to his upcoming procedure.   3. Destructive metastatic lesion in the right humeral head measuring 19 mm.  He is completing another course of palliative radiation due to persistent pain and decreased range of motion. He was receiving denosumab every 4 weeks in treatment of his bony metastasis, but this was held in April due to hypocalcemia.  The patient states he has increased pain with the denosumab, so will hold off on further denosumab for now.    He felt MS Contin did not help his pain.  I will place him on fentanyl 25 micrograms/hour and titrate up as needed. He is seeing Dr. Redmond Pulling again tomorrow for further treatment recommendations. Due to severe, uncontrolled pain and decreased range of motion of his right shoulder, he is total and completely disabled. A letter was written for disability.  He has surgery scheduled for July 29th.   4.  Iron deficiency anemia, he received IV iron in the form of Venofer in March.  He required transfusion of 2 units of packed red blood cells when his hemoglobin dropped to 6.2.  Other than hemorrhoidal bleeding, he denies overt blood loss.  Recent EGD and colonoscopy did not reveal obvious source of blood loss.  H pylori was positive and he is being treated. The  hemoglobin is stable to mildly improved, but his anemia remains microcytic. Repeat iron studies in June reveal evidence of recurrent iron deficiency.  We will arrange for IV Feraheme prior to his scheduled surgery.   5. Vitamin-D deficiency, he continues vitamin-D 3 2000 international units daily.   6. Hypertension, which has been very difficult to control mainly due to the targeted therapies.  His blood pressure is in better range today.   7. Splenic lesion measuring 5.6 x 3.0 cm, which is suspicious for metastatic disease.    8.  Helicobacter Pylori infection, treated triple therapy, which he states he did not complete in timely manner. He follows up with Dr. Lyda Jester.   Plan:     We will arrange for IV Feraheme prior to his scheduled surgery on July 29th.  He will continue to hold everolimus 10 mg daily until 2 weeks after his surgery.  I will send his prescription for hydrocodone 10/325 to [facility] in [place] to help improve his right shoulder pain.  I will plan to see him back in 4 weeks with a CBC, comprehensive metabolic panel for repeat evaluation. The patient understands the plans discussed today and is in agreement with them.  He knows to contact our office if he develops symptoms of worsening anemia or other concerns prior to his next appointment.   I, Rita Ohara, am acting as scribe for Derwood Kaplan, MD  I have reviewed this report as typed by the medical scribe, and it is complete and accurate.

## 2020-12-12 ENCOUNTER — Telehealth: Payer: Self-pay

## 2020-12-12 NOTE — Telephone Encounter (Signed)
12/12/20 @ 1518- Quention Mcneill,RN: Pt notified to stop his Afinitor now. He can restart 1 week post op. Pt verbalized understanding.  12/11/20 -Dr Hinton Rao- If he is still taking the Afinitor, I would hold now and for at least 1 week postop.  And make sure he has f/u with [place] sometime  12/11/20 Vida Roller Mosher,PA:  He's having curettage and cementing R proximal humerus lesion. I hope it works!   12/11/20 Chi Woodham,RN : Pt told me his surgery is scheduled for 12/26/20 w/Dr Magda Bernheim.  Just heads up, thanks!

## 2020-12-15 LAB — CBC AND DIFFERENTIAL
HCT: 28 — AB (ref 41–53)
Hemoglobin: 8.6 — AB (ref 13.5–17.5)
Neutrophils Absolute: 2.9
Platelets: 300 (ref 150–399)
WBC: 4.6

## 2020-12-15 LAB — HEPATIC FUNCTION PANEL
ALT: 14 (ref 10–40)
AST: 21 (ref 14–40)
Alkaline Phosphatase: 97 (ref 25–125)
Bilirubin, Total: 0.3

## 2020-12-15 LAB — BASIC METABOLIC PANEL
BUN: 19 (ref 4–21)
CO2: 26 — AB (ref 13–22)
Chloride: 104 (ref 99–108)
Creatinine: 0.9 (ref 0.6–1.3)
Glucose: 93
Potassium: 4.8 (ref 3.4–5.3)
Sodium: 139 (ref 137–147)

## 2020-12-15 LAB — CBC: RBC: 4.18 (ref 3.87–5.11)

## 2020-12-15 LAB — COMPREHENSIVE METABOLIC PANEL
Albumin: 3.6 (ref 3.5–5.0)
Calcium: 9.1 (ref 8.7–10.7)

## 2020-12-16 ENCOUNTER — Other Ambulatory Visit: Payer: Self-pay | Admitting: Hematology and Oncology

## 2020-12-16 DIAGNOSIS — M25511 Pain in right shoulder: Secondary | ICD-10-CM

## 2020-12-16 DIAGNOSIS — C7951 Secondary malignant neoplasm of bone: Secondary | ICD-10-CM

## 2020-12-16 MED ORDER — HYDROCODONE-ACETAMINOPHEN 10-325 MG PO TABS: 1.0000 | ORAL_TABLET | ORAL | 0 refills | Status: DC | PRN

## 2020-12-17 ENCOUNTER — Inpatient Hospital Stay: Payer: Medicaid Other | Attending: Oncology | Admitting: Oncology

## 2020-12-17 ENCOUNTER — Other Ambulatory Visit: Payer: Self-pay | Admitting: Oncology

## 2020-12-17 ENCOUNTER — Encounter: Payer: Self-pay | Admitting: Oncology

## 2020-12-17 ENCOUNTER — Other Ambulatory Visit: Payer: Self-pay

## 2020-12-17 ENCOUNTER — Telehealth: Payer: Self-pay | Admitting: Oncology

## 2020-12-17 VITALS — BP 137/90 | HR 106 | Temp 98.7°F | Resp 20 | Ht 70.0 in | Wt 211.5 lb

## 2020-12-17 DIAGNOSIS — E559 Vitamin D deficiency, unspecified: Secondary | ICD-10-CM | POA: Insufficient documentation

## 2020-12-17 DIAGNOSIS — C7800 Secondary malignant neoplasm of unspecified lung: Secondary | ICD-10-CM | POA: Insufficient documentation

## 2020-12-17 DIAGNOSIS — D509 Iron deficiency anemia, unspecified: Secondary | ICD-10-CM | POA: Insufficient documentation

## 2020-12-17 DIAGNOSIS — C7951 Secondary malignant neoplasm of bone: Secondary | ICD-10-CM

## 2020-12-17 DIAGNOSIS — Z905 Acquired absence of kidney: Secondary | ICD-10-CM | POA: Insufficient documentation

## 2020-12-17 DIAGNOSIS — C7801 Secondary malignant neoplasm of right lung: Secondary | ICD-10-CM

## 2020-12-17 DIAGNOSIS — I712 Thoracic aortic aneurysm, without rupture: Secondary | ICD-10-CM | POA: Insufficient documentation

## 2020-12-17 DIAGNOSIS — C7802 Secondary malignant neoplasm of left lung: Secondary | ICD-10-CM | POA: Diagnosis not present

## 2020-12-17 DIAGNOSIS — Z79899 Other long term (current) drug therapy: Secondary | ICD-10-CM | POA: Insufficient documentation

## 2020-12-17 DIAGNOSIS — C642 Malignant neoplasm of left kidney, except renal pelvis: Secondary | ICD-10-CM | POA: Insufficient documentation

## 2020-12-17 DIAGNOSIS — Z79891 Long term (current) use of opiate analgesic: Secondary | ICD-10-CM | POA: Insufficient documentation

## 2020-12-17 DIAGNOSIS — B9681 Helicobacter pylori [H. pylori] as the cause of diseases classified elsewhere: Secondary | ICD-10-CM | POA: Insufficient documentation

## 2020-12-17 DIAGNOSIS — M25511 Pain in right shoulder: Secondary | ICD-10-CM

## 2020-12-17 DIAGNOSIS — Z923 Personal history of irradiation: Secondary | ICD-10-CM | POA: Insufficient documentation

## 2020-12-17 DIAGNOSIS — I10 Essential (primary) hypertension: Secondary | ICD-10-CM | POA: Insufficient documentation

## 2020-12-17 DIAGNOSIS — G893 Neoplasm related pain (acute) (chronic): Secondary | ICD-10-CM | POA: Insufficient documentation

## 2020-12-17 MED ORDER — HYDROCODONE-ACETAMINOPHEN 10-325 MG PO TABS: 1.0000 | ORAL_TABLET | ORAL | 0 refills | Status: DC | PRN

## 2020-12-17 NOTE — Telephone Encounter (Signed)
Per 7/20 los next appt scheduled and given to patient

## 2020-12-18 ENCOUNTER — Encounter: Payer: Self-pay | Admitting: Oncology

## 2020-12-19 ENCOUNTER — Other Ambulatory Visit: Payer: Self-pay

## 2020-12-19 ENCOUNTER — Inpatient Hospital Stay: Payer: Medicaid Other

## 2020-12-19 ENCOUNTER — Encounter: Payer: Self-pay | Admitting: Hematology and Oncology

## 2020-12-19 VITALS — BP 140/94 | HR 78 | Temp 98.6°F | Resp 18 | Wt 212.0 lb

## 2020-12-19 DIAGNOSIS — Z79899 Other long term (current) drug therapy: Secondary | ICD-10-CM | POA: Diagnosis not present

## 2020-12-19 DIAGNOSIS — C7951 Secondary malignant neoplasm of bone: Secondary | ICD-10-CM

## 2020-12-19 DIAGNOSIS — G893 Neoplasm related pain (acute) (chronic): Secondary | ICD-10-CM | POA: Diagnosis not present

## 2020-12-19 DIAGNOSIS — Z79891 Long term (current) use of opiate analgesic: Secondary | ICD-10-CM | POA: Diagnosis not present

## 2020-12-19 DIAGNOSIS — B9681 Helicobacter pylori [H. pylori] as the cause of diseases classified elsewhere: Secondary | ICD-10-CM | POA: Diagnosis not present

## 2020-12-19 DIAGNOSIS — Z923 Personal history of irradiation: Secondary | ICD-10-CM | POA: Diagnosis not present

## 2020-12-19 DIAGNOSIS — E559 Vitamin D deficiency, unspecified: Secondary | ICD-10-CM | POA: Diagnosis not present

## 2020-12-19 DIAGNOSIS — C642 Malignant neoplasm of left kidney, except renal pelvis: Secondary | ICD-10-CM | POA: Diagnosis present

## 2020-12-19 DIAGNOSIS — Z905 Acquired absence of kidney: Secondary | ICD-10-CM | POA: Diagnosis not present

## 2020-12-19 DIAGNOSIS — I712 Thoracic aortic aneurysm, without rupture: Secondary | ICD-10-CM | POA: Diagnosis not present

## 2020-12-19 DIAGNOSIS — I10 Essential (primary) hypertension: Secondary | ICD-10-CM | POA: Diagnosis not present

## 2020-12-19 DIAGNOSIS — C7800 Secondary malignant neoplasm of unspecified lung: Secondary | ICD-10-CM | POA: Diagnosis not present

## 2020-12-19 DIAGNOSIS — D509 Iron deficiency anemia, unspecified: Secondary | ICD-10-CM | POA: Diagnosis not present

## 2020-12-19 MED ORDER — SODIUM CHLORIDE 0.9 % IV SOLN
Freq: Once | INTRAVENOUS | Status: AC
  Filled 2020-12-19: qty 250

## 2020-12-19 MED ORDER — SODIUM CHLORIDE 0.9 % IV SOLN
510.0000 mg | Freq: Once | INTRAVENOUS | Status: AC
  Administered 2020-12-19: 510 mg via INTRAVENOUS
  Filled 2020-12-19: qty 510

## 2020-12-19 NOTE — Patient Instructions (Signed)
Ferumoxytol injection What is this medication? FERUMOXYTOL is an iron complex. Iron is used to make healthy red blood cells, which carry oxygen and nutrients throughout the body. This medicine is used totreat iron deficiency anemia. This medicine may be used for other purposes; ask your health care provider orpharmacist if you have questions. COMMON BRAND NAME(S): Feraheme What should I tell my care team before I take this medication? They need to know if you have any of these conditions: anemia not caused by low iron levels high levels of iron in the blood magnetic resonance imaging (MRI) test scheduled an unusual or allergic reaction to iron, other medicines, foods, dyes, or preservatives pregnant or trying to get pregnant breast-feeding How should I use this medication? This medicine is for injection into a vein. It is given by a health careprofessional in a hospital or clinic setting. Talk to your pediatrician regarding the use of this medicine in children.Special care may be needed. Overdosage: If you think you have taken too much of this medicine contact apoison control center or emergency room at once. NOTE: This medicine is only for you. Do not share this medicine with others. What if I miss a dose? It is important not to miss your dose. Call your doctor or health careprofessional if you are unable to keep an appointment. What may interact with this medication? This medicine may interact with the following medications: other iron products This list may not describe all possible interactions. Give your health care provider a list of all the medicines, herbs, non-prescription drugs, or dietary supplements you use. Also tell them if you smoke, drink alcohol, or use illegaldrugs. Some items may interact with your medicine. What should I watch for while using this medication? Visit your doctor or healthcare professional regularly. Tell your doctor or healthcare professional if your  symptoms do not start to get better or if theyget worse. You may need blood work done while you are taking this medicine. You may need to follow a special diet. Talk to your doctor. Foods that contain iron include: whole grains/cereals, dried fruits, beans, or peas, leafy greenvegetables, and organ meats (liver, kidney). What side effects may I notice from receiving this medication? Side effects that you should report to your doctor or health care professionalas soon as possible: allergic reactions like skin rash, itching or hives, swelling of the face, lips, or tongue breathing problems changes in blood pressure feeling faint or lightheaded, falls fever or chills flushing, sweating, or hot feelings swelling of the ankles or feet Side effects that usually do not require medical attention (report to yourdoctor or health care professional if they continue or are bothersome): diarrhea headache nausea, vomiting stomach pain This list may not describe all possible side effects. Call your doctor for medical advice about side effects. You may report side effects to FDA at1-800-FDA-1088. Where should I keep my medication? This drug is given in a hospital or clinic and will not be stored at home. NOTE: This sheet is a summary. It may not cover all possible information. If you have questions about this medicine, talk to your doctor, pharmacist, orhealth care provider.  2022 Elsevier/Gold Standard (2016-07-05 20:21:10)  

## 2020-12-19 NOTE — Progress Notes (Signed)
PT STABLE AT TIME OF DISCHARGE 

## 2020-12-19 NOTE — Progress Notes (Signed)
Discharged Home, stable

## 2020-12-23 ENCOUNTER — Other Ambulatory Visit: Payer: Self-pay

## 2020-12-23 ENCOUNTER — Inpatient Hospital Stay: Payer: Medicaid Other

## 2020-12-23 VITALS — BP 154/82 | HR 72 | Temp 98.3°F | Resp 20 | Ht 70.0 in | Wt 217.2 lb

## 2020-12-23 DIAGNOSIS — C7951 Secondary malignant neoplasm of bone: Secondary | ICD-10-CM

## 2020-12-23 DIAGNOSIS — C642 Malignant neoplasm of left kidney, except renal pelvis: Secondary | ICD-10-CM | POA: Diagnosis not present

## 2020-12-23 MED ORDER — SODIUM CHLORIDE 0.9 % IV SOLN
510.0000 mg | Freq: Once | INTRAVENOUS | Status: AC
  Administered 2020-12-23: 510 mg via INTRAVENOUS
  Filled 2020-12-23: qty 510

## 2020-12-23 MED ORDER — SODIUM CHLORIDE 0.9 % IV SOLN
Freq: Once | INTRAVENOUS | Status: AC
  Filled 2020-12-23: qty 250

## 2020-12-23 NOTE — Progress Notes (Signed)
PT STABLE AT TIME OF DISCHARGE 

## 2020-12-23 NOTE — Progress Notes (Signed)
Discharged home, stable  

## 2020-12-23 NOTE — Patient Instructions (Signed)
Ferumoxytol injection What is this medication? FERUMOXYTOL is an iron complex. Iron is used to make healthy red blood cells, which carry oxygen and nutrients throughout the body. This medicine is used totreat iron deficiency anemia. This medicine may be used for other purposes; ask your health care provider orpharmacist if you have questions. COMMON BRAND NAME(S): Feraheme What should I tell my care team before I take this medication? They need to know if you have any of these conditions: anemia not caused by low iron levels high levels of iron in the blood magnetic resonance imaging (MRI) test scheduled an unusual or allergic reaction to iron, other medicines, foods, dyes, or preservatives pregnant or trying to get pregnant breast-feeding How should I use this medication? This medicine is for injection into a vein. It is given by a health careprofessional in a hospital or clinic setting. Talk to your pediatrician regarding the use of this medicine in children.Special care may be needed. Overdosage: If you think you have taken too much of this medicine contact apoison control center or emergency room at once. NOTE: This medicine is only for you. Do not share this medicine with others. What if I miss a dose? It is important not to miss your dose. Call your doctor or health careprofessional if you are unable to keep an appointment. What may interact with this medication? This medicine may interact with the following medications: other iron products This list may not describe all possible interactions. Give your health care provider a list of all the medicines, herbs, non-prescription drugs, or dietary supplements you use. Also tell them if you smoke, drink alcohol, or use illegaldrugs. Some items may interact with your medicine. What should I watch for while using this medication? Visit your doctor or healthcare professional regularly. Tell your doctor or healthcare professional if your  symptoms do not start to get better or if theyget worse. You may need blood work done while you are taking this medicine. You may need to follow a special diet. Talk to your doctor. Foods that contain iron include: whole grains/cereals, dried fruits, beans, or peas, leafy greenvegetables, and organ meats (liver, kidney). What side effects may I notice from receiving this medication? Side effects that you should report to your doctor or health care professionalas soon as possible: allergic reactions like skin rash, itching or hives, swelling of the face, lips, or tongue breathing problems changes in blood pressure feeling faint or lightheaded, falls fever or chills flushing, sweating, or hot feelings swelling of the ankles or feet Side effects that usually do not require medical attention (report to yourdoctor or health care professional if they continue or are bothersome): diarrhea headache nausea, vomiting stomach pain This list may not describe all possible side effects. Call your doctor for medical advice about side effects. You may report side effects to FDA at1-800-FDA-1088. Where should I keep my medication? This drug is given in a hospital or clinic and will not be stored at home. NOTE: This sheet is a summary. It may not cover all possible information. If you have questions about this medicine, talk to your doctor, pharmacist, orhealth care provider.  2022 Elsevier/Gold Standard (2016-07-05 20:21:10)  

## 2020-12-24 ENCOUNTER — Encounter: Payer: Self-pay | Admitting: Hematology and Oncology

## 2020-12-26 HISTORY — PX: EXCISION / CURETTAGE TUMOR HUMERUS: SUR483

## 2020-12-29 ENCOUNTER — Other Ambulatory Visit: Payer: Self-pay | Admitting: Pharmacist

## 2021-01-08 NOTE — Progress Notes (Signed)
[facility]  [address] [place],  [place]  [id number] [phone]  Clinic Day:  01/15/2021  Referring physician: [name], NP  This document serves as a record of services personally performed by [name], MD. It was created on their behalf by [name],[name], a trained medical scribe. The creation of this record is based on the scribe's personal observations and the provider's statements to them.  CHIEF COMPLAINT:  CC:  Recurrent kidney cancer with mets to bone and lung  Current Treatment:   Everolimus 10 mg daily   HISTORY OF PRESENT ILLNESS:  Steven Peck is a 55 y.o. 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  We began seeing him in May 2020 when he was referred by [name], NP, for a left renal mass, hypercalcemia and iron deficiency anemia.  He had transient hematuria.  CT abdomen and pelvis revealed marked generalized swelling of the left kidney with hyperdensity throughout the renal collecting system, renal pelvis, ureter and posterior bladder consistent with gross hemorrhage with a 6-7 cm infiltrating mass in the upper pole of the left kidney.  There was no evidence of venous invasion.  There were abnormal nodes in the retroperitoneum medial to the left kidney, up to 1.5 cm in diameter.  PET scan revealed malignancy of the kidney, but no definite evidence of metastasis.  There were two right pulmonary nodules measuring 3 mm.  MRI brain did not reveal any intracranial metastasis.  He underwent biopsy of the left kidney mass in June which revealed renal cell carcinoma.  He underwent left radical nephrectomy in August. 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  11 lymph nodes were negative for metastasis.  Tumor extended into the renal vein, but margins were clear.  He received IV iron in the form of Feraheme in July 2020.  Due to his high risk of recurrence, he was  recommended for adjuvant oral chemotherapy with sunitinib 50 mg daily for 2 weeks on and 1 week off for total of 1 year.  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  He was evaluated in the emergency department and placed on clonidine 0.2 mg 3 times daily.  The hypertension was uncontrolled with this as well as other medications.  Sunitinib was therefore discontinued after only 2 weeks of therapy.    He saw Dr. [name] for routine follow-up in February 2021.  At that time, CT abdomen and pelvis revealed basilar pulmonary nodules, which had increased in size and number, and were felt to be highly suspicious for metastatic disease.  There was a persistent splenic lesion, once again felt to be benign.  He was referred back to [place].  He had worsening anemia and iron panel and ferritin were equivocal, but a soluble transferrin was normal, so inconsistent with iron deficiency. PET scan revealed several of the pulmonary nodules to be hypermetabolic, consistent with metastatic disease.  There was no evidence of recurrent or metastatic disease within the abdomen and pelvis.  We recommended palliative immunotherapy with ipilimumab/nivolumab for 4 cycles to be followed by maintenance nivolumab.  He experienced severe pain of his right shoulder and down his right arm with associated swelling.  He is unable to fully lift his arm, and his right neck is also swollen. Ultrasound was negative for thrombosis.  His Port-A-Cath was then removed.  He was seen in clinic at the  end of the day on July 1st., after he had been seen in the emergency department earlier in the day because of mild hemoptysis.  CTA chest with contrast revealed pulmonary metastases with mild progression from March 2021.  The apical segment right lower lobe nodule was 17 mm, previously 10 mm.  Mild ground-glass opacity around a right lower lobe nodule measuring 22 mm, previously 14 mm.  It was unclear if  this represents pseudoprogression or true progression.  He had persistentsevere right shoulder pain, despite tramadol.  Plain films of the right shoulderIn July revealed a large lucent lesion in the right humeral head with cortical destruction, as well as moderate degenerative changes of the acromioclavicular and glenohumeral joints.  CT chest, abdomen and pelvis in July revealed a destructive metastatic lesion of the right proximal humerus measuring 19 mm.  Numerous pulmonary nodules were again observed, similar to June, but new and enlarging nodules overall since March 2021.  The splenic lesion measuring 5.6 x 3.0 cm, previously 4.7 x 5.0 cm, was indeterminate, but suspicious for metastasis despite lack of FDG uptake, as it has enlarged since previous imaging.  Nivolumab was discontinued.  He saw Dr. [name] and received palliative radiation to the right humeral head lesion in July.   He was placed on denosumab every 4 weeks for the bone metastasis.   Due to his progressive disease he was switched to palliative oral chemotherapy with cabozantinib 60 mg daily in July. Cabozantinib had to be held briefly due to uncontrolled hypertension.  He also had hypopigmentation of the perioral area and upper  back.  He discontinued tramadol as this was causing severe headaches and was using hydrocodone/APAP as needed for pain, but he did not tolerate that either.  He resumed cabozantinib in August once his blood pressure was controlled.  He had mild hyperkalemia at that time, so we increased his HCTZ to 2 tabs daily.  He had persistent right shoulder pain, for which he was using Tylenol, then Advil.  We added mirtazapine 15 mg at bedtime for insomnia in September.  He discontinued cabozantinib in September due to progressive vitiligo.  We recommended alternative therapy with axitinib 5 mg twice daily. Repeat CT chest, abdomen and pelvis in September prior to starting axitinib revealed a decrease in the pulmonary nodules,  without evidence of new metastatic disease in the lungs or abdomen and pelvis.  He was found to have an elevated uric acid and findings consistent with gouty arthritis, which he claims he has dealt with in the past.  He had symptoms of shortness of breath, fatigue and hoarseness, as well as a single episode of hemoptysis, so he stopped his axitinib on his own. Repeat CT imaging  in November revealed mild interval progression of bilateral pulmonary metastases, and slight interval increase in size of bilateral external iliac lymph nodes although they remain within normal limits for size.  No evidence for recurrent in the nephrectomy bed was seen.  We resumed axitinib at a lower dose of 3 mg twice daily but unfortunately, he experienced the same side effects, including shortness of breath and edema. CTA chest in late November was read as a very limited study due to respiratory motion artifact. No large or central pulmonary artery embolus identified. Known pulmonary metastatic disease and aortic atherosclerosis. Axitinib was discontinued. He eventually resumed hydrocodone/APAP 10/325 at bedtime as needed.   We then recommended treatment with everolimus 10 mg daily, which he started in January 2022. He tolerated everolimus fairly well, except  for pruritus, for which he was given triamcinolone cream.  He had continued right shoulder pain, which was waking him up at night despite hydrocodone /APAP and Advil at bedtime, so we started him on MS Contin 15 mg every 12 hours.  We tried increasing the dose to 30 mg, but he felt this did not give him adequate relief and he has stopped this.  His anemia has been steadily worsening.  Evaluation revealed a normal B12 and folate, no evidence of hemolysis, but equivocal results on his iron studies.  He was given IV iron in the form of Venofer from March 15th through March 23rd.   He did undergo EGD and colonoscopy in March.  No source of bleeding was found.  H pylori was positive,  so the patient was placed on triple therapy.  CT chest, abdomen and pelvis in April revealed mild progression of bilateral pulmonary nodules compatible with metastatic disease. Index anterior right upper lobe lesion measured previously at 1.4 cm is 1.8 cm.  An 8 mm left lower lobe nodule measured previously is 7 mm.  The 1.7 cm right lower lobe nodule measured previously is 2.0 cm.  Index right lower lobe retro hilar nodule measured previously at 1.4 cm measures 2.1 cm.  No definite new pulmonary nodule or mass.  There was a similar appearance of the lytic lesion involving the right humeral head with associated joint effusion.  No evidence for local recurrence in the left nephrectomy bed. He continued Advil during the day with hydrocodone as needed. Denosumab was held in April due to mild hypocalcemia.  He was referred back to Dr. [name] for additional radiation therapy to the right humeral head and completed palliative radiation about 4 weeks ago. He was also referred to Dr. Redmond Pulling, Orthopedic Oncology, at [facility] and he has recommended surgery.  He states he had occasional mouth sores, which resolve with MBX solution.  He states he took MS Contin 15 mg every 12 hours regularly for 3 weeks and it did not seem to help his pain, so he stopped it.   He is no longer working, so is able to take the hydrocodone/APAP more frequently and is taking 2 tablets 3 times a day.  He states this does help his pain.  I warned him about taking more than this  due to the Tylenol. He did not tolerate oxycodone very well. He is hesitant to try another long acting narcotic.  CT chest from July 18th revealed the bilateral pulmonary metastases remain fairly stable.  A few of the previously non indexed pulmonary nodules have increased in size and one has decreased from 1.8 to 1.5 cm.  No definite new suspicious pulmonary nodules or masses.  Similar size of the 4.1 cm ascending aortic aneurysm.    INTERVAL HISTORY:  Doyne is here for  follow up after undergoing curetting of the bone tumor of the right proximal humerus on July 29th with Dr. Magda Bernheim.  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  He states that he is healing well, and his pain has mildly improved.  He still rates his pain as a 5/10 today.  He will be starting physical therapy soon.  He was prescribed meloxicam daily, which he continues, and also continues aspirin BID.  I advised he decrease the aspirin to one daily and discontinue the meloxicam due to his renal insufficieny and history of nephrectomy.  He continues to hold everolimus 10 mg daily as instructed, but may resume this now  as he is doing well.  His hemoglobin has improved from 8.6 to 10.0 after two doses of IV iron prior to surgery.  White count and platelets are normal.  Chemistries are unremarkable except for a BUN of 21.  His  appetite is good, and he has gained 1 pound since his last visit.  He denies fever, chills or other signs of infection.  He denies nausea, vomiting, bowel issues, or abdominal pain.  He denies sore throat, cough, dyspnea, or chest pain.  REVIEW OF SYSTEMS:  Review of Systems  Constitutional: Negative.  Negative for appetite change, chills, fatigue, fever and unexpected weight change.  HENT:  Negative.    Eyes: Negative.   Respiratory: Negative.  Negative for chest tightness, cough, hemoptysis, shortness of breath and wheezing.   Cardiovascular: Negative.  Negative for chest pain, leg swelling and palpitations.  Gastrointestinal: Negative.  Negative for abdominal distention, abdominal pain, blood in stool, constipation, diarrhea, nausea and vomiting.  Endocrine: Negative.   Genitourinary: Negative.  Negative for difficulty urinating, dysuria, frequency and hematuria.   Musculoskeletal:  Negative for arthralgias, back pain, flank pain, gait problem and myalgias.       Right shoulder pain, improved since surgery  Skin: Negative.   Neurological: Negative.   Negative for dizziness, extremity weakness, gait problem, headaches, light-headedness, numbness, seizures and speech difficulty.  Hematological: Negative.   Psychiatric/Behavioral: Negative.  Negative for depression and sleep disturbance. The patient is not nervous/anxious.   All other systems reviewed and are negative.   VITALS:  Blood pressure (!) 148/89, pulse 88, temperature 98.3 F (36.8 C), temperature source Oral, resp. rate 18, height $RemoveBe'5\' 10"'vAJSBjMhQ$  (1.778 m), weight 213 lb (96.6 kg), SpO2 99 %.  Wt Readings from Last 3 Encounters:  01/15/21 213 lb (96.6 kg)  12/23/20 217 lb 4 oz (98.5 kg)  12/19/20 212 lb (96.2 kg)    Body mass index is 30.56 kg/m.  Performance status (ECOG): 1 - Symptomatic but completely ambulatory  PHYSICAL EXAM:  Physical Exam Constitutional:      General: He is not in acute distress.    Appearance: Normal appearance. He is normal weight.  HENT:     Head: Normocephalic and atraumatic.  Eyes:     General: No scleral icterus.    Extraocular Movements: Extraocular movements intact.     Conjunctiva/sclera: Conjunctivae normal.     Pupils: Pupils are equal, round, and reactive to light.  Cardiovascular:     Rate and Rhythm: Normal rate and regular rhythm.     Pulses: Normal pulses.     Heart sounds: Normal heart sounds. No murmur heard.   No friction rub. No gallop.  Pulmonary:     Effort: Pulmonary effort is normal. No respiratory distress.     Breath sounds: Normal breath sounds.  Abdominal:     General: Bowel sounds are normal. There is no distension.     Palpations: Abdomen is soft. There is no hepatomegaly, splenomegaly or mass.     Tenderness: There is no abdominal tenderness.  Musculoskeletal:        General: Normal range of motion.     Cervical back: Normal range of motion and neck supple.     Right lower leg: No edema.     Left lower leg: No edema.     Comments: He has a long healing incision of the anterior right shoulder measuring 8 cm long.   Lymphadenopathy:     Cervical: No cervical adenopathy.  Skin:  General: Skin is warm and dry.  Neurological:     General: No focal deficit present.     Mental Status: He is alert and oriented to person, place, and time. Mental status is at baseline.  Psychiatric:        Mood and Affect: Mood normal.        Behavior: Behavior normal.        Thought Content: Thought content normal.        Judgment: Judgment normal.   LABS:   CBC Latest Ref Rng & Units 01/15/2021 12/15/2020 11/17/2020  WBC - 4.7 4.6 4.9  Hemoglobin 13.5 - 17.5 10.0(A) 8.6(A) 8.2(A)  Hematocrit 41 - 53 32(A) 28(A) 27(A)  Platelets 150 - 399 273 300 337   CMP Latest Ref Rng & Units 01/15/2021 12/15/2020 11/17/2020  Glucose 70 - 99 mg/dL - - -  BUN 4 - $R'21 21 19 15  'eo$ Creatinine 0.6 - 1.3 1.1 0.9 1.0  Sodium 137 - 147 138 139 137  Potassium 3.4 - 5.3 4.5 4.8 4.3  Chloride 99 - 108 103 104 104  CO2 13 - 22 25(A) 26(A) 22  Calcium 8.7 - 10.7 9.4 9.1 8.6(A)  Alkaline Phos 25 - 125 82 97 125  AST 14 - 40 $Re'20 21 16  'GyW$ ALT 10 - 40 $Re'12 14 15    'DpH$ Lab Results  Component Value Date   TIBC 224 (L) 10/20/2020   TIBC 234 07/29/2020   FERRITIN 1,807 (H) 10/20/2020   FERRITIN 681 07/29/2020   IRONPCTSAT 8 (L) 10/20/2020   IRONPCTSAT TNP 07/29/2020   Lab Results  Component Value Date   LDH 302 (A) 07/29/2020    STUDIES:  No results found.   PATHOLOGY: 12/26/2020 A.  HUMERUS, RIGHT PROXIMAL, CURETTAGE:              -  Metastatic clear cell renal cell carcinoma. The patient's history of clear cell renal cell carcinoma is noted.  Immunohistochemical stain for PAX 8 was performed and shows strongly and diffusely nuclear positivity.  The finding confirms the diagnosis of metastatic renal cell carcinoma.    HISTORY:   Allergies:  Allergies  Allergen Reactions   Hydrochlorothiazide Swelling   Lisinopril Swelling    Angioedema of lips Angioedema of lips Angioedema of lips   Tramadol     Other reaction(s): Other (See  Comments)    Current Medications: Current Outpatient Medications  Medication Sig Dispense Refill   aspirin 81 MG EC tablet Take by mouth in the morning and at bedtime.     AFINITOR 10 MG tablet TAKE 1 TABLET DAILY 30 tablet 5   allopurinol (ZYLOPRIM) 100 MG tablet Take 100 mg by mouth daily.     ANUCORT-HC 25 MG suppository Place 25 mg rectally 2 (two) times daily as needed.     calcium citrate-vitamin D (CITRACAL+D) 315-200 MG-UNIT tablet Take 1 tablet by mouth 2 (two) times daily.     Denosumab (XGEVA [place]) Inject into the skin.     HYDROcodone-acetaminophen (NORCO/VICODIN) 5-325 MG tablet Take 1-2 tablets by mouth every 6 (six) hours as needed.     hydrocortisone (ANUSOL-HC) 25 MG suppository Place 1 suppository (25 mg total) rectally 2 (two) times daily. 12 suppository 0   hydrocortisone cream 1 % Apply 1 application topically 2 (two) times daily.     lactulose (CHRONULAC) 10 GM/15ML solution Take 10 g by mouth at bedtime as needed for mild constipation.     meloxicam (MOBIC) 7.5 MG tablet Take  7.5 mg by mouth daily.     Multiple Vitamins-Minerals (MULTIVITAMIN WITH MINERALS) tablet Take 1 tablet by mouth daily.     NARCAN 4 MG/0.1ML LIQD nasal spray kit SMARTSIG:1 Both Nares Daily PRN     olmesartan (BENICAR) 20 MG tablet TAKE 1 TABLET BY MOUTH ONCE DAILY FOR 90 DAYS     Omega-3 1000 MG CAPS Take by mouth.     ondansetron (ZOFRAN) 4 MG tablet Take 4 mg by mouth every 8 (eight) hours as needed for nausea or vomiting.     prochlorperazine (COMPAZINE) 10 MG tablet Take 10 mg by mouth every 6 (six) hours as needed for nausea or vomiting.     triamcinolone (KENALOG) 0.025 % cream Apply 1 application topically 2 (two) times daily.     vitamin B-12 (CYANOCOBALAMIN) 100 MCG tablet Take 100 mcg by mouth daily.     No current facility-administered medications for this visit.     ASSESSMENT & PLAN:   Assessment: 1. Stage III renal cell carcinoma status post radical left nephrectomy.  Due to  the stage and renal vein invasion, we recommended adjuvant therapy with sunitinib, which he could not tolerate due to severe uncontrolled hypertension , despite multiple medications.    2. Recurrent renal cell carcinoma with PET positive pulmonary nodules.  He has had multiple lines of therapy, and he has not tolerated most oral chemotherapies. He is now on palliative everolimus 10 mg daily and is tolerating this without significant difficulty.  This appears to be keeping his disease relatively stable.  This has been on hold due to his recent surgery, but he may resume now.   3. Destructive metastatic lesion in the right humeral head measuring 19 mm.  He is completed a second course of palliative radiation due to persistent pain and decreased range of motion. He was receiving denosumab every 4 weeks in treatment of his bony metastasis, but this was held in April due to hypocalcemia.  The patient states he has increased pain with the denosumab, so will hold off on further denosumab for now.    He felt MS Contin did not help his pain.  He continues hydrocodone for his pain.  He underwent surgery with Dr. Redmond Pulling on July 29th and is healing well.  He does note some improvement in his pain.   4.  Iron deficiency anemia, he received IV iron in the form of Venofer in March 2022.  He required transfusion of 2 units of packed red blood cells when his hemoglobin dropped to 6.2.  Other than hemorrhoidal bleeding, he denies overt blood loss.  Recent EGD and colonoscopy did not reveal obvious source of blood loss.  H pylori was positive and he was treated accordingly. The hemoglobin has improved after 2 doses of IV Feraheme prior to surgery in July.   5. Vitamin-D deficiency, he continues vitamin-D 3 2000 international units daily.   6. Hypertension, which has been very difficult to control mainly due to the targeted therapies.  His blood pressure is in better range today.   7. Splenic lesion measuring 5.6 x 3.0 cm,  which is suspicious for metastatic disease.    8.  Helicobacter Pylori infection, treated with triple therapy, which he states he did not complete in timely manner. He follows up with Dr. Lyda Jester.   Plan:     He has held everolimus 10 mg daily as instructed, and may now resume this since he is healing well.  He will discontinue meloxicam, and decrease  his daily aspirin to once daily.  I will plan to see him back in 2 weeks with a CBC, comprehensive metabolic panel for repeat evaluation.  If he stabilizes, we can go to monthly visits.  He will be due for repeat scans in the fall.  The patient understands the plans discussed today and is in agreement with them.  He knows to contact our office if he develops symptoms of worsening anemia or other concerns prior to his next appointment.   I, Rita Ohara, am acting as scribe for Derwood Kaplan, MD  I have reviewed this report as typed by the medical scribe, and it is complete and accurate.  [name], MD [facility]

## 2021-01-15 ENCOUNTER — Encounter: Payer: Self-pay | Admitting: Oncology

## 2021-01-15 ENCOUNTER — Inpatient Hospital Stay: Payer: Medicaid Other | Attending: Oncology | Admitting: Oncology

## 2021-01-15 ENCOUNTER — Other Ambulatory Visit: Payer: Self-pay | Admitting: Oncology

## 2021-01-15 ENCOUNTER — Telehealth: Payer: Self-pay | Admitting: Oncology

## 2021-01-15 ENCOUNTER — Inpatient Hospital Stay: Payer: Medicaid Other

## 2021-01-15 VITALS — BP 148/89 | HR 88 | Temp 98.3°F | Resp 18 | Ht 70.0 in | Wt 213.0 lb

## 2021-01-15 DIAGNOSIS — C7801 Secondary malignant neoplasm of right lung: Secondary | ICD-10-CM | POA: Diagnosis not present

## 2021-01-15 DIAGNOSIS — D500 Iron deficiency anemia secondary to blood loss (chronic): Secondary | ICD-10-CM

## 2021-01-15 DIAGNOSIS — C7951 Secondary malignant neoplasm of bone: Secondary | ICD-10-CM | POA: Diagnosis not present

## 2021-01-15 DIAGNOSIS — E559 Vitamin D deficiency, unspecified: Secondary | ICD-10-CM

## 2021-01-15 DIAGNOSIS — C642 Malignant neoplasm of left kidney, except renal pelvis: Secondary | ICD-10-CM | POA: Diagnosis not present

## 2021-01-15 DIAGNOSIS — C7802 Secondary malignant neoplasm of left lung: Secondary | ICD-10-CM

## 2021-01-15 LAB — BASIC METABOLIC PANEL
BUN: 21 (ref 4–21)
CO2: 25 — AB (ref 13–22)
Chloride: 103 (ref 99–108)
Creatinine: 1.1 (ref 0.6–1.3)
Glucose: 107
Potassium: 4.5 (ref 3.4–5.3)
Sodium: 138 (ref 137–147)

## 2021-01-15 LAB — COMPREHENSIVE METABOLIC PANEL
Albumin: 4 (ref 3.5–5.0)
Calcium: 9.4 (ref 8.7–10.7)

## 2021-01-15 LAB — CBC AND DIFFERENTIAL
HCT: 32 — AB (ref 41–53)
Hemoglobin: 10 — AB (ref 13.5–17.5)
Neutrophils Absolute: 2.77
Platelets: 273 (ref 150–399)
WBC: 4.7

## 2021-01-15 LAB — HEPATIC FUNCTION PANEL
ALT: 12 (ref 10–40)
AST: 20 (ref 14–40)
Alkaline Phosphatase: 82 (ref 25–125)
Bilirubin, Total: 0.3

## 2021-01-15 LAB — CBC: RBC: 4.3 (ref 3.87–5.11)

## 2021-01-15 NOTE — Telephone Encounter (Signed)
Per 8/18 LOS, patient scheduled for Sept Appt's.  Gave patient Appt Summary

## 2021-01-18 ENCOUNTER — Encounter: Payer: Self-pay | Admitting: Oncology

## 2021-01-19 ENCOUNTER — Other Ambulatory Visit: Payer: Self-pay

## 2021-01-22 NOTE — Progress Notes (Signed)
[facility]  [address] [place],  [place]  [address] [phone]  Clinic Day:  01/30/2021  Referring physician: [name], NP  This document serves as a record of services personally performed by [name], MD. It was created on their behalf by [name],[name], a trained medical scribe. The creation of this record is based on the scribe's personal observations and the provider's statements to them.  CHIEF COMPLAINT:  CC:  Recurrent kidney cancer with mets to bone and lung  Current Treatment:   Everolimus 10 mg daily   HISTORY OF PRESENT ILLNESS:  Steven Peck is a 55 y.o. 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  We began seeing him in May 2020 when he was referred by [name], NP, for a left renal mass, hypercalcemia and iron deficiency anemia.  He had transient hematuria.  CT abdomen and pelvis revealed marked generalized swelling of the left kidney with hyperdensity throughout the renal collecting system, renal pelvis, ureter and posterior bladder consistent with gross hemorrhage with a 6-7 cm infiltrating mass in the upper pole of the left kidney.  There was no evidence of venous invasion.  There were abnormal nodes in the retroperitoneum medial to the left kidney, up to 1.5 cm in diameter.  PET scan revealed malignancy of the kidney, but no definite evidence of metastasis.  There were two right pulmonary nodules measuring 3 mm.  MRI brain did not reveal any intracranial metastasis.  He underwent biopsy of the left kidney mass in June which revealed renal cell carcinoma.  He underwent left radical nephrectomy in August. 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  11 lymph nodes were negative for metastasis.  Tumor extended into the renal vein, but margins were clear.  He received IV iron in the form of Feraheme in July 2020.  Due to his high risk of recurrence, he was  recommended for adjuvant oral chemotherapy with sunitinib 50 mg daily for 2 weeks on and 1 week off for total of 1 year.  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  He was evaluated in the emergency department and placed on clonidine 0.2 mg 3 times daily.  The hypertension was uncontrolled with this as well as other medications.  Sunitinib was therefore discontinued after only 2 weeks of therapy.    He saw Dr. [name] for routine follow-up in February 2021.  At that time, CT abdomen and pelvis revealed basilar pulmonary nodules, which had increased in size and number, and were felt to be highly suspicious for metastatic disease.  There was a persistent splenic lesion, once again felt to be benign.  He was referred back to [place].  He had worsening anemia and iron panel and ferritin were equivocal, but a soluble transferrin was normal, so inconsistent with iron deficiency. PET scan revealed several of the pulmonary nodules to be hypermetabolic, consistent with metastatic disease.  There was no evidence of recurrent or metastatic disease within the abdomen and pelvis.  We recommended palliative immunotherapy with ipilimumab/nivolumab for 4 cycles to be followed by maintenance nivolumab.  He experienced severe pain of his right shoulder and down his right arm with associated swelling.  He is unable to fully lift his arm, and his right neck is also swollen. Ultrasound was negative for thrombosis.  His Port-A-Cath was then removed.  He was seen in clinic at the  end of the day on July 1st., after he had been seen in the emergency department earlier in the day because of mild hemoptysis.  CTA chest with contrast revealed pulmonary metastases with mild progression from March 2021.  The apical segment right lower lobe nodule was 17 mm, previously 10 mm.  Mild ground-glass opacity around a right lower lobe nodule measuring 22 mm, previously 14 mm.  It was unclear if  this represents pseudoprogression or true progression.  He had persistentsevere right shoulder pain, despite tramadol.  Plain films of the right shoulderIn July revealed a large lucent lesion in the right humeral head with cortical destruction, as well as moderate degenerative changes of the acromioclavicular and glenohumeral joints.  CT chest, abdomen and pelvis in July revealed a destructive metastatic lesion of the right proximal humerus measuring 19 mm.  Numerous pulmonary nodules were again observed, similar to June, but new and enlarging nodules overall since March 2021.  The splenic lesion measuring 5.6 x 3.0 cm, previously 4.7 x 5.0 cm, was indeterminate, but suspicious for metastasis despite lack of FDG uptake, as it has enlarged since previous imaging.  Nivolumab was discontinued.  He saw Dr. [name] and received palliative radiation to the right humeral head lesion in July.   He was placed on denosumab every 4 weeks for the bone metastasis.   Due to his progressive disease he was switched to palliative oral chemotherapy with cabozantinib 60 mg daily in July. Cabozantinib had to be held briefly due to uncontrolled hypertension.  He also had hypopigmentation of the perioral area and upper  back.  He discontinued tramadol as this was causing severe headaches and was using hydrocodone/APAP as needed for pain, but he did not tolerate that either.  He resumed cabozantinib in August once his blood pressure was controlled.   We added mirtazapine 15 mg at bedtime for insomnia in September.  He discontinued cabozantinib in September due to progressive vitiligo.  We recommended alternative therapy with axitinib 5 mg twice daily. Repeat CT chest, abdomen and pelvis in September prior to starting axitinib revealed a decrease in the pulmonary nodules, without evidence of new metastatic disease in the lungs or abdomen and pelvis.  He was found to have an elevated uric acid and findings consistent with gouty arthritis,  which he claims he has dealt with in the past.  He had symptoms of shortness of breath, fatigue and hoarseness, as well as a single episode of hemoptysis, so he stopped his axitinib on his own. Repeat CT imaging  in November revealed mild interval progression of bilateral pulmonary metastases, and slight interval increase in size of bilateral external iliac lymph nodes although they remain within normal limits for size.  No evidence for recurrence in the nephrectomy bed was seen.  We resumed axitinib at a lower dose of 3 mg twice daily but unfortunately, he experienced the same side effects, including shortness of breath and edema. CTA chest in late November was read as a very limited study due to respiratory motion artifact. No large or central pulmonary artery embolus identified. Known pulmonary metastatic disease and aortic atherosclerosis. Axitinib was discontinued. He eventually resumed hydrocodone/APAP 10/325 at bedtime as needed.   We then recommended treatment with everolimus 10 mg daily, which he started in January 2022. He tolerated everolimus fairly well, except for pruritus, for which he was given triamcinolone cream.  He had continued right shoulder pain, which was waking him up at night despite hydrocodone /APAP and Advil at bedtime, so  we started him on MS Contin 15 mg every 12 hours.  We tried increasing the dose to 30 mg, but he felt this did not give him adequate relief and he has stopped this.  His anemia has been steadily worsening.  Evaluation revealed a normal B12 and folate, no evidence of hemolysis, but equivocal results on his iron studies.  He was given IV iron in the form of Venofer from March 15th through March 23rd.   He did undergo EGD and colonoscopy in March.  No source of bleeding was found.  H pylori was positive, so the patient was placed on triple therapy.  CT chest, abdomen and pelvis in April revealed mild progression of bilateral pulmonary nodules compatible with metastatic  disease. Index anterior right upper lobe lesion measured previously at 1.4 cm is 1.8 cm.  An 8 mm left lower lobe nodule measured previously is 7 mm.  The 1.7 cm right lower lobe nodule measured previously is 2.0 cm.  Index right lower lobe retro hilar nodule measured previously at 1.4 cm measures 2.1 cm.  No definite new pulmonary nodule or mass.  There was a similar appearance of the lytic lesion involving the right humeral head with associated joint effusion.  No evidence for local recurrence in the left nephrectomy bed. He continued Advil during the day with hydrocodone as needed. Denosumab was held in April due to mild hypocalcemia.  He was referred back to Dr. [name] for additional radiation therapy to the right humeral head and completed palliative radiation about 4 weeks ago. He was also referred to Dr. Redmond Pulling, Orthopedic Oncology, at [facility] and he has recommended surgery.  He states he had occasional mouth sores, which resolve with MBX solution.  He states he took MS Contin 15 mg every 12 hours regularly for 3 weeks and it did not seem to help his pain, so he stopped it.   He is no longer working, so is able to take the hydrocodone/APAP more frequently and is taking 2 tablets 3 times a day.  He states this does help his pain.  I warned him about taking more than this  due to the Tylenol. He did not tolerate oxycodone very well. He is hesitant to try another long acting narcotic.  CT chest from July 18th revealed the bilateral pulmonary metastases remain fairly stable.  A few of the previously non indexed pulmonary nodules have increased in size and one has decreased from 1.8 to 1.5 cm.  No definite new suspicious pulmonary nodules or masses.  Similar size of the 4.1 cm ascending aortic aneurysm.  Dr. Magda Bernheim did curettage of the bone tumor of the right proximal humerus on July 29th.  INTERVAL HISTORY:  Haji is here for routine follow up and reports pain of the left foot and ankle, as well as  swelling of the left ankle and right wrist, which he feels may represent a gout flare up. I will add a uric acid to his labs today. I had cautioned him not to take meloxicam at his last visit due to the absence of one kidney, but will consider if he absolutely needs it. He is on allopurinol 100 mg, and I advised that he increase to 300 mg daily, and I will send in a new prescription.  I will also send in for colchicine 0.6 mg BID. He has numbness and stiffness of the right shoulder and continues physical therapy twice weekly. He notes numbness and tingling of the feet, consistent with peripheral  neuropathy, and is scheduled with a neurologist in the near future. He has tried gabapentin with some improvement. He is scheduled for dental fillings this month. He also notes some wheezing at night when he is lying down. Hemoglobin has improved from 10.0 to 11.0, and white count and platelets are normal. Chemistries are unremarkable. His  appetite is good, and he has gained 2 and 1/2 pounds since his last visit.  He denies fever, chills or other signs of infection.  He denies nausea, vomiting, bowel issues, or abdominal pain.  He denies sore throat, cough, dyspnea, or chest pain.  REVIEW OF SYSTEMS:  Review of Systems  Constitutional: Negative.  Negative for appetite change, chills, fatigue, fever and unexpected weight change.  HENT:  Negative.    Eyes: Negative.   Respiratory:  Positive for wheezing (at night). Negative for chest tightness, cough, hemoptysis and shortness of breath.   Cardiovascular: Negative.  Negative for chest pain, leg swelling and palpitations.  Gastrointestinal: Negative.  Negative for abdominal distention, abdominal pain, blood in stool, constipation, diarrhea, nausea and vomiting.  Endocrine: Negative.   Genitourinary: Negative.  Negative for difficulty urinating, dysuria, frequency and hematuria.   Musculoskeletal:  Negative for arthralgias, back pain, flank pain, gait problem and  myalgias.       Pain of the left foot and ankle as well as swelling of the left ankle and wrist; numbness and stiffness of the right shoulder  Skin: Negative.   Neurological:  Positive for numbness (and paresthesias of the feet). Negative for dizziness, extremity weakness, gait problem, headaches, light-headedness, seizures and speech difficulty.  Hematological: Negative.   Psychiatric/Behavioral: Negative.  Negative for depression and sleep disturbance. The patient is not nervous/anxious.   All other systems reviewed and are negative.   VITALS:  Blood pressure (!) 146/85, pulse 98, temperature 98.3 F (36.8 C), temperature source Oral, resp. rate 18, height $RemoveBe'5\' 10"'evGZlcRcF$  (1.778 m), weight 215 lb 12.8 oz (97.9 kg), SpO2 100 %.  Wt Readings from Last 3 Encounters:  01/30/21 215 lb 12.8 oz (97.9 kg)  01/15/21 213 lb (96.6 kg)  12/23/20 217 lb 4 oz (98.5 kg)    Body mass index is 30.96 kg/m.  Performance status (ECOG): 1 - Symptomatic but completely ambulatory  PHYSICAL EXAM:  Physical Exam Constitutional:      General: He is not in acute distress.    Appearance: Normal appearance. He is normal weight.  HENT:     Head: Normocephalic and atraumatic.  Eyes:     General: No scleral icterus.    Extraocular Movements: Extraocular movements intact.     Conjunctiva/sclera: Conjunctivae normal.     Pupils: Pupils are equal, round, and reactive to light.  Cardiovascular:     Rate and Rhythm: Normal rate and regular rhythm.     Pulses: Normal pulses.     Heart sounds: Normal heart sounds. No murmur heard.   No friction rub. No gallop.  Pulmonary:     Effort: Pulmonary effort is normal. No respiratory distress.     Breath sounds: Normal breath sounds.  Abdominal:     General: Bowel sounds are normal. There is no distension.     Palpations: Abdomen is soft. There is no hepatomegaly, splenomegaly or mass.     Tenderness: There is no abdominal tenderness.  Musculoskeletal:        General:  Normal range of motion.     Right wrist: Swelling (and erythema, which is tender) present.     Cervical back:  Normal range of motion and neck supple.     Right lower leg: No edema.     Left lower leg: No edema.     Comments: His right shoulder is still in a sling.  Lymphadenopathy:     Cervical: No cervical adenopathy.  Skin:    General: Skin is warm and dry.  Neurological:     General: No focal deficit present.     Mental Status: He is alert and oriented to person, place, and time. Mental status is at baseline.  Psychiatric:        Mood and Affect: Mood normal.        Behavior: Behavior normal.        Thought Content: Thought content normal.        Judgment: Judgment normal.   LABS:   CBC Latest Ref Rng & Units 01/30/2021 01/15/2021 12/15/2020  WBC - 4.2 4.7 4.6  Hemoglobin 13.5 - 17.5 11.0(A) 10.0(A) 8.6(A)  Hematocrit 41 - 53 35(A) 32(A) 28(A)  Platelets 150 - 399 218 273 300   CMP Latest Ref Rng & Units 01/30/2021 01/15/2021 12/15/2020  Glucose 70 - 99 mg/dL - - -  BUN 4 - _0 Creatinine 0.6 - 1.3 1.0 1.1 0.9  Sodium 137 - 147 137 138 139  Potassium 3.4 - 5.3 4.4 4.5 4.8  Chloride 99 - 108 104 103 104  CO2 13 - 22 23(A) 25(A) 26(A)  Calcium 8.7 - 10.7 9.0 9.4 9.1  Alkaline Phos 25 - 125 87 82 97  AST 14 - 40 _1 ALT 10 - 40 _2 Lab Results  Component Value Date   TIBC 224 (L) 10/20/2020   TIBC 234 07/29/2020   FERRITIN 1,807 (H) 10/20/2020   FERRITIN 681 07/29/2020   IRONPCTSAT 8 (L) 10/20/2020   IRONPCTSAT TNP 07/29/2020   Lab Results  Component Value Date   LDH 302 (A) 07/29/2020    STUDIES:  No results found.   HISTORY:   Allergies:  Allergies  Allergen Reactions   Hydrochlorothiazide Swelling   Lisinopril Swelling    Angioedema of lips Angioedema of lips Angioedema of lips   Tramadol     Other reaction(s): Other (See Comments)    Current Medications: Current Outpatient Medications  Medication Sig Dispense Refill    AFINITOR 10 MG tablet TAKE 1 TABLET DAILY 30 tablet 5   allopurinol (ZYLOPRIM) 100 MG tablet Take 100 mg by mouth daily.     ANUCORT-HC 25 MG suppository Place 25 mg rectally 2 (two) times daily as needed.     calcium citrate-vitamin D (CITRACAL+D) 315-200 MG-UNIT tablet Take 1 tablet by mouth 2 (two) times daily.     Denosumab (XGEVA [place]) Inject into the skin.     HYDROcodone-acetaminophen (NORCO/VICODIN) 5-325 MG tablet Take 1-2 tablets by mouth every 6 (six) hours as needed. 60 tablet 0   hydrocortisone (ANUSOL-HC) 25 MG suppository Place 1 suppository (25 mg total) rectally 2 (two) times daily. 12 suppository 0   hydrocortisone cream 1 % Apply 1 application topically 2 (two) times daily.     lactulose (CHRONULAC) 10 GM/15ML solution Take 10 g by mouth at bedtime as needed for mild constipation.     Multiple Vitamins-Minerals (MULTIVITAMIN WITH MINERALS) tablet Take 1 tablet by mouth daily.     NARCAN 4 MG/0.1ML LIQD nasal spray kit SMARTSIG:1 Both Nares Daily PRN     olmesartan (BENICAR) 20 MG tablet TAKE 1  TABLET BY MOUTH ONCE DAILY FOR 90 DAYS     Omega-3 1000 MG CAPS Take by mouth.     ondansetron (ZOFRAN) 4 MG tablet Take 4 mg by mouth every 8 (eight) hours as needed for nausea or vomiting.     prochlorperazine (COMPAZINE) 10 MG tablet Take 10 mg by mouth every 6 (six) hours as needed for nausea or vomiting.     triamcinolone (KENALOG) 0.025 % cream Apply 1 application topically 2 (two) times daily.     vitamin B-12 (CYANOCOBALAMIN) 100 MCG tablet Take 100 mcg by mouth daily.     No current facility-administered medications for this visit.     ASSESSMENT & PLAN:   Assessment: 1. Stage III renal cell carcinoma status post radical left nephrectomy.  Due to the stage and renal vein invasion, we recommended adjuvant therapy with sunitinib, which he could not tolerate due to severe uncontrolled hypertension , despite multiple medications.    2. Recurrent renal cell carcinoma with PET  positive pulmonary nodules.  He has had multiple lines of therapy, and he has not tolerated most oral chemotherapies. He is now on palliative everolimus 10 mg daily and is tolerating this without significant difficulty.  This appears to be keeping his disease relatively stable.  This was on hold for a brief time due to his right shoulder surgery.   3. Destructive metastatic lesion in the right humeral head measuring 19 mm.  He is completed a second course of palliative radiation due to persistent pain and decreased range of motion. He was receiving denosumab every 4 weeks in treatment of his bony metastasis, but this was held in April due to hypocalcemia.  The patient states he has increased pain with the denosumab, so will hold off on further denosumab for now.    He felt MS Contin did not help his pain.  He continues hydrocodone for his pain.  He underwent surgery with Dr. Redmond Pulling on July 29th and is healing well.  He does note improvement in his pain.   4.  Iron deficiency anemia, he received IV iron in the form of Venofer in March 2022.  He required transfusion of 2 units of packed red blood cells when his hemoglobin dropped to 6.2.  Other than hemorrhoidal bleeding, he denies overt blood loss.  Recent EGD and colonoscopy did not reveal obvious source of blood loss.  H pylori was positive and he was treated accordingly. The hemoglobin has improved after 2 doses of IV Feraheme prior to surgery in July.   5. Vitamin-D deficiency, he continues vitamin-D 3 2000 international units daily.   6. Hypertension, which has been very difficult to control mainly due to the targeted therapies.  His blood pressure is in better range today.   7. Splenic lesion measuring 5.6 x 3.0 cm, which is suspicious for metastatic disease.    8.  Helicobacter Pylori infection, treated with triple therapy, which he states he did not complete in timely manner. He follows up with Dr. Lyda Jester.   9.  Pain of the left foot and  ankle, and right wrist which may represent gout.  I will evaluate with a uric acid level.  Plan:     He continues everolimus 10 mg daily without significant difficulty.  I will add a uric acid to his labs today and will send in for colchicine 0.6 mg BID, and will increase the allopurinol to 300 mg daily.  His hydrocodone 5/325 was sent in on August 31st.  We  will plan to see him back in 3 weeks with a CBC, comprehensive metabolic panel for repeat evaluation.  If he stabilizes, we can go to monthly visits.  He will be due for repeat scans in the fall.  The patient understands the plans discussed today and is in agreement with them.  He knows to contact our office if he develops symptoms of worsening anemia or other concerns prior to his next appointment.   I, Rita Ohara, am acting as scribe for Derwood Kaplan, MD  I have reviewed this report as typed by the medical scribe, and it is complete and accurate.  [name], MD [place] at [facility]

## 2021-01-28 ENCOUNTER — Other Ambulatory Visit: Payer: Self-pay

## 2021-01-28 DIAGNOSIS — M25511 Pain in right shoulder: Secondary | ICD-10-CM

## 2021-01-28 DIAGNOSIS — C7951 Secondary malignant neoplasm of bone: Secondary | ICD-10-CM

## 2021-01-28 DIAGNOSIS — G893 Neoplasm related pain (acute) (chronic): Secondary | ICD-10-CM

## 2021-01-28 MED ORDER — HYDROCODONE-ACETAMINOPHEN 5-325 MG PO TABS: 1.0000 | ORAL_TABLET | Freq: Four times a day (QID) | ORAL | 0 refills | Status: DC | PRN

## 2021-01-30 ENCOUNTER — Encounter: Payer: Self-pay | Admitting: Oncology

## 2021-01-30 ENCOUNTER — Inpatient Hospital Stay (INDEPENDENT_AMBULATORY_CARE_PROVIDER_SITE_OTHER): Payer: Medicaid Other | Admitting: Oncology

## 2021-01-30 ENCOUNTER — Inpatient Hospital Stay: Payer: Medicaid Other | Attending: Oncology

## 2021-01-30 ENCOUNTER — Telehealth: Payer: Self-pay | Admitting: Oncology

## 2021-01-30 ENCOUNTER — Other Ambulatory Visit: Payer: Self-pay | Admitting: Oncology

## 2021-01-30 ENCOUNTER — Other Ambulatory Visit: Payer: Self-pay

## 2021-01-30 VITALS — BP 146/85 | HR 98 | Temp 98.3°F | Resp 18 | Ht 70.0 in | Wt 215.8 lb

## 2021-01-30 DIAGNOSIS — M25572 Pain in left ankle and joints of left foot: Secondary | ICD-10-CM | POA: Insufficient documentation

## 2021-01-30 DIAGNOSIS — M79672 Pain in left foot: Secondary | ICD-10-CM | POA: Diagnosis not present

## 2021-01-30 DIAGNOSIS — C7802 Secondary malignant neoplasm of left lung: Secondary | ICD-10-CM

## 2021-01-30 DIAGNOSIS — C642 Malignant neoplasm of left kidney, except renal pelvis: Secondary | ICD-10-CM

## 2021-01-30 DIAGNOSIS — C7801 Secondary malignant neoplasm of right lung: Secondary | ICD-10-CM

## 2021-01-30 DIAGNOSIS — M109 Gout, unspecified: Secondary | ICD-10-CM

## 2021-01-30 DIAGNOSIS — C7951 Secondary malignant neoplasm of bone: Secondary | ICD-10-CM

## 2021-01-30 DIAGNOSIS — M25531 Pain in right wrist: Secondary | ICD-10-CM | POA: Insufficient documentation

## 2021-01-30 LAB — CBC AND DIFFERENTIAL
HCT: 35 — AB (ref 41–53)
Hemoglobin: 11 — AB (ref 13.5–17.5)
Neutrophils Absolute: 2.27
Platelets: 218 (ref 150–399)
WBC: 4.2

## 2021-01-30 LAB — BASIC METABOLIC PANEL
BUN: 20 (ref 4–21)
CO2: 23 — AB (ref 13–22)
Chloride: 104 (ref 99–108)
Creatinine: 1 (ref 0.6–1.3)
Glucose: 113
Potassium: 4.4 (ref 3.4–5.3)
Sodium: 137 (ref 137–147)

## 2021-01-30 LAB — HEPATIC FUNCTION PANEL
ALT: 15 (ref 10–40)
AST: 24 (ref 14–40)
Alkaline Phosphatase: 87 (ref 25–125)
Bilirubin, Total: 0.3

## 2021-01-30 LAB — CBC: RBC: 4.63 (ref 3.87–5.11)

## 2021-01-30 LAB — COMPREHENSIVE METABOLIC PANEL
Albumin: 4.1 (ref 3.5–5.0)
Calcium: 9 (ref 8.7–10.7)

## 2021-01-30 LAB — URIC ACID: Uric Acid, Serum: 6.6 mg/dL (ref 3.7–8.6)

## 2021-01-30 MED ORDER — ALLOPURINOL 300 MG PO TABS: 300.0000 mg | ORAL_TABLET | Freq: Every day | ORAL | 5 refills | Status: DC

## 2021-01-30 MED ORDER — COLCHICINE 0.6 MG PO TABS: 0.6000 mg | ORAL_TABLET | Freq: Two times a day (BID) | ORAL | 1 refills | Status: DC

## 2021-01-30 NOTE — Telephone Encounter (Signed)
Per 9/2 LOS next appt scheduled and given to patient

## 2021-02-02 ENCOUNTER — Encounter: Payer: Self-pay | Admitting: Oncology

## 2021-02-03 ENCOUNTER — Other Ambulatory Visit: Payer: Self-pay

## 2021-02-03 ENCOUNTER — Telehealth: Payer: Self-pay

## 2021-02-03 MED ORDER — MAGIC MOUTHWASH W/LIDOCAINE: 5.0000 mL | ORAL | 3 refills | Status: AC | PRN

## 2021-02-03 NOTE — Telephone Encounter (Signed)
-----   Message from Derwood Kaplan, MD sent at 02/02/2021  9:53 AM EDT ----- Regarding: call pt Tell him uric acid is normal but that doesn't rule out gout.  I guess we were unable to get the colchicine approved?  How is he doing?  If still in a lot of pain, could use the meloxicam.

## 2021-02-04 ENCOUNTER — Telehealth: Payer: Self-pay

## 2021-02-04 NOTE — Telephone Encounter (Signed)
-----   Message from Derwood Kaplan, MD sent at 02/02/2021  9:53 AM EDT ----- Regarding: call pt Tell him uric acid is normal but that doesn't rule out gout.  I guess we were unable to get the colchicine approved?  How is he doing?  If still in a lot of pain, could use the meloxicam.

## 2021-02-05 ENCOUNTER — Telehealth: Payer: Self-pay

## 2021-02-05 NOTE — Telephone Encounter (Signed)
-----   Message from Derwood Kaplan, MD sent at 02/02/2021  9:53 AM EDT ----- Regarding: call pt Tell him uric acid is normal but that doesn't rule out gout.  I guess we were unable to get the colchicine approved?  How is he doing?  If still in a lot of pain, could use the meloxicam.

## 2021-02-05 NOTE — Telephone Encounter (Signed)
Attempted to contact patient. No answer nor VM.

## 2021-02-09 ENCOUNTER — Other Ambulatory Visit: Payer: Self-pay | Admitting: Oncology

## 2021-02-09 DIAGNOSIS — M109 Gout, unspecified: Secondary | ICD-10-CM

## 2021-02-09 MED ORDER — METHYLPREDNISOLONE 4 MG PO TBPK
ORAL_TABLET | ORAL | 0 refills | Status: DC
Start: 2021-02-09 — End: 2021-03-18

## 2021-02-10 ENCOUNTER — Telehealth: Payer: Self-pay

## 2021-02-10 NOTE — Telephone Encounter (Addendum)
Pt notified of below and verbalized understanding.  ----- Message from Derwood Kaplan, MD sent at 02/09/2021  5:53 PM EDT ----- Regarding: RE: Wrist, ankle pain & on PCN VK from dentist Contact: [phone] Yes, he needs to take the ab. & we need to keep the Xgeva on hold. I don't know what is the problem with his wrist and ankle but still could be gout even with normal uric acid level.  We couldn't get the colchicine approved so will place him on medrol dospak If still a problem, needs to see his PCP I don't think this is caused by his Afinitor ----- Message ----- From: Dairl Ponder, RN Sent: 02/09/2021   1:55 PM EDT To: Derwood Kaplan, MD, Belva Chimes, LPN Subject: Wrist, ankle pain & on PCN VK from dentist     Pt LVM on triage line telling us that he was seen by dentist because his jaw was swollen a little. No teeth extraction. They put him on PCN '500mg'$  po QID x 10days. He wanted to make sure that was ok since he only has 1 kidney? I also saw the note between you and Levada Dy last week regarding the wrist & ankle pain. I told him the uric acid level was normal. He still rates the pain in his wrist and ankle as 10/10, even with using the hydrocodone.

## 2021-02-13 NOTE — Progress Notes (Signed)
[facility]  [address] [place],  [place]  [address] [phone]  Clinic Day:  02/20/2021  Referring physician: [name], NP  This document serves as a record of services personally performed by [name], MD. It was created on their behalf by [name],[name], a trained medical scribe. The creation of this record is based on the scribe's personal observations and the provider's statements to them.  CHIEF COMPLAINT:  CC:  Recurrent kidney cancer with mets to bone and lung  Current Treatment:   Everolimus 10 mg daily   HISTORY OF PRESENT ILLNESS:  Steven Peck is a 55 y.o. 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  We began seeing him in May 2020 when he was referred by [name], NP, for a left renal mass, hypercalcemia and iron deficiency anemia.  He had transient hematuria.  CT abdomen and pelvis revealed marked generalized swelling of the left kidney with hyperdensity throughout the renal collecting system, renal pelvis, ureter and posterior bladder consistent with gross hemorrhage with a 6-7 cm infiltrating mass in the upper pole of the left kidney.  There was no evidence of venous invasion.  There were abnormal nodes in the retroperitoneum medial to the left kidney, up to 1.5 cm in diameter.  PET scan revealed malignancy of the kidney, but no definite evidence of metastasis.  There were two right pulmonary nodules measuring 3 mm.  MRI brain did not reveal any intracranial metastasis.  He underwent biopsy of the left kidney mass in June which revealed renal cell carcinoma.  He underwent left radical nephrectomy in August. 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  11 lymph nodes were negative for metastasis.  Tumor extended into the renal vein, but margins were clear.  He received IV iron in the form of Feraheme in July 2020.  Due to his high risk of recurrence, he was  recommended for adjuvant oral chemotherapy with sunitinib 50 mg daily for 2 weeks on and 1 week off for total of 1 year.  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  He was evaluated in the emergency department and placed on clonidine 0.2 mg 3 times daily.  The hypertension was uncontrolled with this as well as other medications.  Sunitinib was therefore discontinued after only 2 weeks of therapy.    He saw Dr. [name] for routine follow-up in February 2021.  At that time, CT abdomen and pelvis revealed basilar pulmonary nodules, which had increased in size and number, and were felt to be highly suspicious for metastatic disease.  There was a persistent splenic lesion, once again felt to be benign.  He was referred back to [place].  He had worsening anemia and iron panel and ferritin were equivocal, but a soluble transferrin was normal, so inconsistent with iron deficiency. PET scan revealed several of the pulmonary nodules to be hypermetabolic, consistent with metastatic disease.  There was no evidence of recurrent or metastatic disease within the abdomen and pelvis.  We recommended palliative immunotherapy with ipilimumab/nivolumab for 4 cycles to be followed by maintenance nivolumab.  He experienced severe pain of his right shoulder and down his right arm with associated swelling.  He is unable to fully lift his arm, and his right neck is also swollen. Ultrasound was negative for thrombosis.  His Port-A-Cath was then removed.  He was seen in clinic at the  end of the day on July 1st., after he had been seen in the emergency department earlier in the day because of mild hemoptysis.  CTA chest with contrast revealed pulmonary metastases with mild progression from March 2021.  The apical segment right lower lobe nodule was 17 mm, previously 10 mm.  Mild ground-glass opacity around a right lower lobe nodule measuring 22 mm, previously 14 mm.  It was unclear if  this represents pseudoprogression or true progression.  He had persistentsevere right shoulder pain, despite tramadol.  Plain films of the right shoulderIn July revealed a large lucent lesion in the right humeral head with cortical destruction, as well as moderate degenerative changes of the acromioclavicular and glenohumeral joints.  CT chest, abdomen and pelvis in July revealed a destructive metastatic lesion of the right proximal humerus measuring 19 mm.  Numerous pulmonary nodules were again observed, similar to June, but new and enlarging nodules overall since March 2021.  The splenic lesion measuring 5.6 x 3.0 cm, previously 4.7 x 5.0 cm, was indeterminate, but suspicious for metastasis despite lack of FDG uptake, as it has enlarged since previous imaging.  Nivolumab was discontinued.  He saw Dr. [name] and received palliative radiation to the right humeral head lesion in July.   He was placed on denosumab every 4 weeks for the bone metastasis.   Due to his progressive disease he was switched to palliative oral chemotherapy with cabozantinib 60 mg daily in July. Cabozantinib had to be held briefly due to uncontrolled hypertension.  He also had hypopigmentation of the perioral area and upper  back.  He discontinued tramadol as this was causing severe headaches and was using hydrocodone/APAP as needed for pain, but he did not tolerate that either.  He resumed cabozantinib in August once his blood pressure was controlled.   We added mirtazapine 15 mg at bedtime for insomnia in September.  He discontinued cabozantinib in September due to progressive vitiligo.  We recommended alternative therapy with axitinib 5 mg twice daily. Repeat CT chest, abdomen and pelvis in September prior to starting axitinib revealed a decrease in the pulmonary nodules, without evidence of new metastatic disease in the lungs or abdomen and pelvis.  He was found to have an elevated uric acid and findings consistent with gouty arthritis,  which he claims he has dealt with in the past.  He had symptoms of shortness of breath, fatigue and hoarseness, as well as a single episode of hemoptysis, so he stopped his axitinib on his own. Repeat CT imaging  in November revealed mild interval progression of bilateral pulmonary metastases, and slight interval increase in size of bilateral external iliac lymph nodes although they remain within normal limits for size.  No evidence for recurrence in the nephrectomy bed was seen.  We resumed axitinib at a lower dose of 3 mg twice daily but unfortunately, he experienced the same side effects, including shortness of breath and edema. CTA chest in late November was read as a very limited study due to respiratory motion artifact. No large or central pulmonary artery embolus identified. Known pulmonary metastatic disease and aortic atherosclerosis. Axitinib was discontinued. He eventually resumed hydrocodone/APAP 10/325 at bedtime as needed.   We then recommended treatment with everolimus 10 mg daily, which he started in January 2022. He tolerated everolimus fairly well, except for pruritus, for which he was given triamcinolone cream.  He had continued right shoulder pain, which was waking him up at night despite hydrocodone /APAP and Advil at bedtime, so  we started him on MS Contin 15 mg every 12 hours.  We tried increasing the dose to 30 mg, but he felt this did not give him adequate relief and he has stopped this.  His anemia has been steadily worsening.  Evaluation revealed a normal B12 and folate, no evidence of hemolysis, but equivocal results on his iron studies.  He was given IV iron in the form of Venofer from March 15th through March 23rd.   He did undergo EGD and colonoscopy in March.  No source of bleeding was found.  H pylori was positive, so the patient was placed on triple therapy.  CT chest, abdomen and pelvis in April revealed mild progression of bilateral pulmonary nodules compatible with metastatic  disease. Index anterior right upper lobe lesion measured previously at 1.4 cm is 1.8 cm.  An 8 mm left lower lobe nodule measured previously is 7 mm.  The 1.7 cm right lower lobe nodule measured previously is 2.0 cm.  Index right lower lobe retro hilar nodule measured previously at 1.4 cm measures 2.1 cm.  No definite new pulmonary nodule or mass.  There was a similar appearance of the lytic lesion involving the right humeral head with associated joint effusion.  No evidence for local recurrence in the left nephrectomy bed. He continued Advil during the day with hydrocodone as needed. Denosumab was held in April due to mild hypocalcemia.  He was referred back to Dr. [name] for additional radiation therapy to the right humeral head and completed palliative radiation about 4 weeks ago. He was also referred to Dr. Redmond Pulling, Orthopedic Oncology, at [facility] and he has recommended surgery.  He states he had occasional mouth sores, which resolve with MBX solution.  He states he took MS Contin 15 mg every 12 hours regularly for 3 weeks and it did not seem to help his pain, so he stopped it.   He is no longer working, so is able to take the hydrocodone/APAP more frequently and is taking 2 tablets 3 times a day.  He states this does help his pain.  I warned him about taking more than this  due to the Tylenol. He did not tolerate oxycodone very well. He is hesitant to try another long acting narcotic.  CT chest from July 18th revealed the bilateral pulmonary metastases remain fairly stable.  A few of the previously non indexed pulmonary nodules have increased in size and one has decreased from 1.8 to 1.5 cm.  No definite new suspicious pulmonary nodules or masses.  Similar size of the 4.1 cm ascending aortic aneurysm.  Dr. Magda Bernheim did curettage of the bone tumor of the right proximal humerus on July 29th.  INTERVAL HISTORY:  Teresa is here for routine follow up and states that he is doing well. Last time, he has  problems with gouty arthritis and we did prescribe colchicine. He needs a dental extraction, but has been referred to an oral surgeon in [place] as they are concerned it may leave a defect to the sinuses. He continues everolimus 10 mg daily without difficulty. He continues to improved following his surgery, and continues PT. He now has less pain and increased mobility of the right shoulder. He still has pain of the left ankle, and has been taking colchicine daily, and I advised that he also take allopurinol 300 mg daily. He has been somewhat noncompliant with his blood pressure medication. He does note some wheezing. Hemoglobin has increased from 11.0 to 11.7, and white count  and platelets are normal. Chemistries are unremarkable except for a BUN of 21. His  appetite is fair, his weight is stable since his last visit.  He denies fever, chills or other signs of infection.  He denies nausea, vomiting, bowel issues, or abdominal pain.  He denies sore throat, cough, dyspnea, or chest pain.  REVIEW OF SYSTEMS:  Review of Systems  Constitutional: Negative.  Negative for appetite change, chills, fatigue, fever and unexpected weight change.  HENT:  Negative.    Eyes: Negative.   Respiratory:  Positive for wheezing. Negative for chest tightness, cough, hemoptysis and shortness of breath.   Cardiovascular: Negative.  Negative for chest pain, leg swelling and palpitations.  Gastrointestinal: Negative.  Negative for abdominal distention, abdominal pain, blood in stool, constipation, diarrhea, nausea and vomiting.  Endocrine: Negative.   Genitourinary: Negative.  Negative for difficulty urinating, dysuria, frequency and hematuria.   Musculoskeletal:  Negative for arthralgias, back pain, flank pain, gait problem and myalgias.       Left ankle pain  Skin: Negative.   Neurological: Negative.  Negative for dizziness, extremity weakness, gait problem, headaches, light-headedness, numbness, seizures and speech  difficulty.  Hematological: Negative.   Psychiatric/Behavioral: Negative.  Negative for depression and sleep disturbance. The patient is not nervous/anxious.   All other systems reviewed and are negative.   VITALS:  Blood pressure (!) 154/98, pulse (!) 120, temperature 98.3 F (36.8 C), temperature source Oral, resp. rate 20, height $RemoveBe'5\' 10"'VpROIBqek$  (1.778 m), weight 215 lb 11.2 oz (97.8 kg), SpO2 97 %.  Wt Readings from Last 3 Encounters:  02/20/21 215 lb 11.2 oz (97.8 kg)  01/30/21 215 lb 12.8 oz (97.9 kg)  01/15/21 213 lb (96.6 kg)    Body mass index is 30.95 kg/m.  Performance status (ECOG): 1 - Symptomatic but completely ambulatory  PHYSICAL EXAM:  Physical Exam Constitutional:      General: He is not in acute distress.    Appearance: Normal appearance. He is normal weight.  HENT:     Head: Normocephalic and atraumatic.  Eyes:     General: No scleral icterus.    Extraocular Movements: Extraocular movements intact.     Conjunctiva/sclera: Conjunctivae normal.     Pupils: Pupils are equal, round, and reactive to light.  Cardiovascular:     Rate and Rhythm: Normal rate and regular rhythm.     Pulses: Normal pulses.     Heart sounds: Normal heart sounds. No murmur heard.   No friction rub. No gallop.  Pulmonary:     Effort: Pulmonary effort is normal. No respiratory distress.     Breath sounds: Normal breath sounds.  Abdominal:     General: Bowel sounds are normal. There is no distension.     Palpations: Abdomen is soft. There is no hepatomegaly, splenomegaly or mass.     Tenderness: There is no abdominal tenderness.  Musculoskeletal:        General: Normal range of motion.     Cervical back: Normal range of motion and neck supple.     Right lower leg: No edema.     Left lower leg: No edema.  Lymphadenopathy:     Cervical: No cervical adenopathy.  Skin:    General: Skin is warm and dry.  Neurological:     General: No focal deficit present.     Mental Status: He is alert  and oriented to person, place, and time. Mental status is at baseline.  Psychiatric:  Mood and Affect: Mood normal.        Behavior: Behavior normal.        Thought Content: Thought content normal.        Judgment: Judgment normal.   LABS:   CBC Latest Ref Rng & Units 02/20/2021 01/30/2021 01/15/2021  WBC - 5.6 4.2 4.7  Hemoglobin 13.5 - 17.5 11.7(A) 11.0(A) 10.0(A)  Hematocrit 41 - 53 39(A) 35(A) 32(A)  Platelets 150 - 399 328 218 273   CMP Latest Ref Rng & Units 02/20/2021 01/30/2021 01/15/2021  Glucose 70 - 99 mg/dL - - -  BUN 4 - $R'21 21 20 21  'Cn$ Creatinine 0.6 - 1.3 1.0 1.0 1.1  Sodium 137 - 147 138 137 138  Potassium 3.4 - 5.3 3.9 4.4 4.5  Chloride 99 - 108 103 104 103  CO2 13 - 22 25(A) 23(A) 25(A)  Calcium 8.7 - 10.7 9.8 9.0 9.4  Alkaline Phos 25 - 125 112 87 82  AST 14 - 40 $Re'28 24 20  'qFc$ ALT 10 - 40 36 15 12    Lab Results  Component Value Date   TIBC 224 (L) 10/20/2020   TIBC 234 07/29/2020   FERRITIN 1,807 (H) 10/20/2020   FERRITIN 681 07/29/2020   IRONPCTSAT 8 (L) 10/20/2020   IRONPCTSAT TNP 07/29/2020   Lab Results  Component Value Date   LDH 302 (A) 07/29/2020    STUDIES:  No results found.   HISTORY:   Allergies:  Allergies  Allergen Reactions   Lisinopril Swelling    Angioedema of lips Angioedema of lips Angioedema of lips Angioedema of lips   Tramadol     Other reaction(s): Other (See Comments) Other reaction(s): Other (see comments) Severe headache   Hydrochlorothiazide Swelling    Current Medications: Current Outpatient Medications  Medication Sig Dispense Refill   AFINITOR 10 MG tablet TAKE 1 TABLET DAILY 30 tablet 5   ANUCORT-HC 25 MG suppository Place 25 mg rectally 2 (two) times daily as needed.     calcium citrate-vitamin D (CITRACAL+D) 315-200 MG-UNIT tablet Take 1 tablet by mouth 2 (two) times daily.     colchicine 0.6 MG tablet Take 1 tablet (0.6 mg total) by mouth 2 (two) times daily. 60 tablet 1   Denosumab (XGEVA [place]) Inject  into the skin.     HYDROcodone-acetaminophen (NORCO/VICODIN) 5-325 MG tablet Take 1-2 tablets by mouth every 6 (six) hours as needed. 60 tablet 0   hydrocortisone cream 1 % Apply 1 application topically 2 (two) times daily.     lactulose (CHRONULAC) 10 GM/15ML solution Take 10 g by mouth at bedtime as needed for mild constipation.     magic mouthwash w/lidocaine SOLN Take 5 mLs by mouth every 3 (three) hours as needed for mouth pain. Or throat pain; swish and spit 240 mL 3   NARCAN 4 MG/0.1ML LIQD nasal spray kit SMARTSIG:1 Both Nares Daily PRN     olmesartan (BENICAR) 20 MG tablet TAKE 1 TABLET BY MOUTH ONCE DAILY FOR 90 DAYS     Omega-3 1000 MG CAPS Take by mouth.     ondansetron (ZOFRAN) 4 MG tablet Take 4 mg by mouth every 8 (eight) hours as needed for nausea or vomiting.     prochlorperazine (COMPAZINE) 10 MG tablet Take 10 mg by mouth every 6 (six) hours as needed for nausea or vomiting.     vitamin B-12 (CYANOCOBALAMIN) 100 MCG tablet Take 100 mcg by mouth daily.     allopurinol (ZYLOPRIM) 300 MG tablet  Take 1 tablet (300 mg total) by mouth daily. (Patient not taking: Reported on 02/20/2021) 30 tablet 5   hydrocortisone (ANUSOL-HC) 25 MG suppository Place 1 suppository (25 mg total) rectally 2 (two) times daily. (Patient not taking: Reported on 02/20/2021) 12 suppository 0   methylPREDNISolone (MEDROL DOSEPAK) 4 MG TBPK tablet As directed on dospak (Patient not taking: Reported on 02/20/2021) 21 tablet 0   Multiple Vitamins-Minerals (MULTIVITAMIN WITH MINERALS) tablet Take 1 tablet by mouth daily. (Patient not taking: Reported on 02/20/2021)     triamcinolone (KENALOG) 0.025 % cream Apply 1 application topically 2 (two) times daily. (Patient not taking: Reported on 02/20/2021)     No current facility-administered medications for this visit.     ASSESSMENT & PLAN:   Assessment: 1. Stage III renal cell carcinoma status post radical left nephrectomy.  Due to the stage and renal vein invasion,  we recommended adjuvant therapy with sunitinib, which he could not tolerate due to severe uncontrolled hypertension , despite multiple medications.    2. Recurrent renal cell carcinoma with PET positive pulmonary nodules.  He has had multiple lines of therapy, and he has not tolerated most oral chemotherapies. He is now on palliative everolimus 10 mg daily and is tolerating this without significant difficulty.  This appears to be keeping his disease relatively stable.  This was on hold for a brief time due to his right shoulder surgery.   3. Destructive metastatic lesion in the right humeral head measuring 19 mm.  He is completed a second course of palliative radiation due to persistent pain and decreased range of motion. He was receiving denosumab every 4 weeks in treatment of his bony metastasis, but this was held in April due to hypocalcemia.  The patient states he has increased pain with the denosumab, so will hold off on further denosumab for now. He felt MS Contin did not help his pain.  He continues hydrocodone for his pain.  He underwent surgery with Dr. Redmond Pulling on July 29th and is healing well.  He does note improvement in his pain.   4.  Iron deficiency anemia, he received IV iron in the form of Venofer in March 2022.  He required transfusion of 2 units of packed red blood cells when his hemoglobin dropped to 6.2.  Other than hemorrhoidal bleeding, he denies overt blood loss.  Recent EGD and colonoscopy did not reveal obvious source of blood loss.  H pylori was positive and he was treated accordingly. The hemoglobin has improved after 2 doses of IV Feraheme prior to surgery in July.   5. Vitamin-D deficiency, he continues vitamin-D 3 2000 international units daily.   6. Hypertension, which has been very difficult to control mainly due to the targeted therapies. I recommend that he be compliant with his daily medication so that we can continue treatment.   7. Splenic lesion measuring 5.6 x 3.0  cm, which is suspicious for metastatic disease.    8.  Helicobacter Pylori infection, treated with triple therapy, which he states he did not complete in timely manner. He follows up with Dr. Lyda Jester.   9.  Pain of the left foot and ankle, and right wrist which likely represents gout.  He continues colchicine 0.6 mg BID, and I advised that he also take allopurinol 300 mg daily.  Plan:     He continues everolimus 10 mg daily without significant difficulty. He continues to improve from his right shoulder surgery. I did recommend that he continue his blood pressure  medication daily as well as allopurinol 300 mg daily. We will plan to see him back in 4 weeks with a CBC, comprehensive metabolic panel for repeat evaluation. He will be due for repeat CT chest, abdomen and pelvis in November.  The patient understands the plans discussed today and is in agreement with them.  He knows to contact our office if he develops symptoms of worsening anemia or other concerns prior to his next appointment.   I, Rita Ohara, am acting as scribe for Derwood Kaplan, MD  I have reviewed this report as typed by the medical scribe, and it is complete and accurate.  [name], MD [facility]

## 2021-02-20 ENCOUNTER — Other Ambulatory Visit: Payer: Self-pay | Admitting: Oncology

## 2021-02-20 ENCOUNTER — Inpatient Hospital Stay (INDEPENDENT_AMBULATORY_CARE_PROVIDER_SITE_OTHER): Payer: Medicaid Other | Admitting: Oncology

## 2021-02-20 ENCOUNTER — Other Ambulatory Visit: Payer: Self-pay | Admitting: Hematology and Oncology

## 2021-02-20 ENCOUNTER — Inpatient Hospital Stay: Payer: Medicaid Other

## 2021-02-20 ENCOUNTER — Encounter: Payer: Self-pay | Admitting: Oncology

## 2021-02-20 ENCOUNTER — Telehealth: Payer: Self-pay | Admitting: Oncology

## 2021-02-20 VITALS — BP 154/98 | HR 120 | Temp 98.3°F | Resp 20 | Ht 70.0 in | Wt 215.7 lb

## 2021-02-20 DIAGNOSIS — R300 Dysuria: Secondary | ICD-10-CM

## 2021-02-20 DIAGNOSIS — C7801 Secondary malignant neoplasm of right lung: Secondary | ICD-10-CM

## 2021-02-20 DIAGNOSIS — C7951 Secondary malignant neoplasm of bone: Secondary | ICD-10-CM | POA: Diagnosis not present

## 2021-02-20 DIAGNOSIS — C642 Malignant neoplasm of left kidney, except renal pelvis: Secondary | ICD-10-CM | POA: Diagnosis not present

## 2021-02-20 DIAGNOSIS — C7802 Secondary malignant neoplasm of left lung: Secondary | ICD-10-CM

## 2021-02-20 LAB — CBC AND DIFFERENTIAL
HCT: 39 — AB (ref 41–53)
Hemoglobin: 11.7 — AB (ref 13.5–17.5)
Neutrophils Absolute: 3.75
Platelets: 328 (ref 150–399)
WBC: 5.6

## 2021-02-20 LAB — BASIC METABOLIC PANEL
BUN: 21 (ref 4–21)
CO2: 25 — AB (ref 13–22)
Chloride: 103 (ref 99–108)
Creatinine: 1 (ref 0.6–1.3)
Glucose: 114
Potassium: 3.9 (ref 3.4–5.3)
Sodium: 138 (ref 137–147)

## 2021-02-20 LAB — HEPATIC FUNCTION PANEL
ALT: 36 (ref 10–40)
AST: 28 (ref 14–40)
Alkaline Phosphatase: 112 (ref 25–125)
Bilirubin, Total: 0.2

## 2021-02-20 LAB — CBC
MCV: 77 — AB (ref 80–94)
RBC: 5.05 (ref 3.87–5.11)

## 2021-02-20 LAB — COMPREHENSIVE METABOLIC PANEL
Albumin: 4.1 (ref 3.5–5.0)
Calcium: 9.8 (ref 8.7–10.7)

## 2021-02-20 NOTE — Telephone Encounter (Signed)
Per 9/23 LOS, patient scheduled for Oct Appt's.  Gave patient Appt Calendar

## 2021-02-23 ENCOUNTER — Telehealth: Payer: Self-pay | Admitting: Oncology

## 2021-02-23 ENCOUNTER — Other Ambulatory Visit: Payer: Self-pay | Admitting: Oncology

## 2021-02-23 ENCOUNTER — Telehealth: Payer: Self-pay

## 2021-02-23 DIAGNOSIS — C7801 Secondary malignant neoplasm of right lung: Secondary | ICD-10-CM

## 2021-02-23 NOTE — Telephone Encounter (Addendum)
02/24/21 Pt tested negative for COVID. Urgent care gave him an inhaler and prednisone.  02/23/21 Jaydyn Bozzo,RN: He is going to COVID tested @ PCP or Urgent Care asap, and call me with results.   ----- Message from Derwood Kaplan, MD sent at 02/23/2021 12:26 PM EDT ----- Regarding: RE: Hemoptysis, runny nose, sore throat. Afebrile Yes, if he tests neg for COVID, he needs CT chest ASAP and then f/u with me ----- Message ----- From: Dairl Ponder, RN Sent: 02/23/2021   9:58 AM EDT To: Derwood Kaplan, MD Subject: Hemoptysis, runny nose, sore throat. Afebrile  Pt called to report that he started coughing up blood about 3 days ago. When first started was about quarter size. Now the blood is more like a spray (in the kleenex) when he coughs. I instructed him to get tested for COVID asap. Please advise.  [phone]

## 2021-02-23 NOTE — Telephone Encounter (Signed)
Patient contacted regarding scheduled *STAT* per Dr Hinton Rao 9/26.  CT Scheduled for 02/24/21 checking in at [facility] at 2:45 pm.  Gave patient Appt/Instructions

## 2021-02-24 ENCOUNTER — Encounter: Payer: Self-pay | Admitting: Oncology

## 2021-02-25 ENCOUNTER — Encounter: Payer: Self-pay | Admitting: Oncology

## 2021-02-26 ENCOUNTER — Telehealth: Payer: Self-pay | Admitting: Oncology

## 2021-02-26 NOTE — Telephone Encounter (Signed)
Per Levada Dy, add patient to Dr Remi Deter schedule on 9/30 9:00 am

## 2021-02-26 NOTE — Progress Notes (Addendum)
Steven Peck  [address] [place],  [place]  [address] [phone]  THIS VISIT WAS CONDUCTED VIA TELEHEALTH WITH THE CONSENT OF THE PATIENT. IT WAS CONDUCTED BY TELEPHONE FROM 9:20 TO 9:35, FOR 15 MINUTES.  LOCATION:  Patient: Home                       Physician:  office at [facility] at [facility] Day:  02/27/2021  Referring physician: [name], NP  This document serves as a record of services personally performed by [name], MD. It was created on their behalf by [name],[name], a trained medical scribe. The creation of this record is based on the scribe's personal observations and the provider's statements to them.  CHIEF COMPLAINT:  CC:  Recurrent kidney cancer with mets to bone and lung  Current Treatment:   Everolimus 10 mg daily   HISTORY OF PRESENT ILLNESS:  Steven Peck is a 55 y.o. 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  We began seeing him in May 2020 when he was referred by [name], NP, for a left renal mass, hypercalcemia and iron deficiency anemia.  He had transient hematuria.  CT abdomen and pelvis revealed marked generalized swelling of the left kidney with hyperdensity throughout the renal collecting system, renal pelvis, ureter and posterior bladder consistent with gross hemorrhage with a 6-7 cm infiltrating mass in the upper pole of the left kidney.  There was no evidence of venous invasion.  There were abnormal nodes in the retroperitoneum medial to the left kidney, up to 1.5 cm in diameter.  PET scan revealed malignancy of the kidney, but no definite evidence of metastasis.  There were two right pulmonary nodules measuring 3 mm.  MRI brain did not reveal any intracranial metastasis.  He underwent biopsy of the left kidney mass in June which revealed renal cell carcinoma.  He underwent left radical nephrectomy in August. Pathology revealed a 9.5 cm, grade 3,  clear cell renal cell carcinoma.  11 lymph nodes were negative for metastasis.  Tumor extended into the renal vein, but margins were clear.  He received IV iron in the form of Feraheme in July 2020.  Due to his high risk of recurrence, he was recommended for adjuvant oral chemotherapy with sunitinib 50 mg daily for 2 weeks on and 1 week off for total of 1 year.  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  He was evaluated in the emergency department and placed on clonidine 0.2 mg 3 times daily.  The hypertension was uncontrolled with this as well as other medications.  Sunitinib was therefore discontinued after only 2 weeks of therapy.    He saw Dr. [name] for routine follow-up in February 2021.  At that time, CT abdomen and pelvis revealed basilar pulmonary nodules, which had increased in size and number, and were felt to be highly suspicious for metastatic disease.  There was a persistent splenic lesion, once again felt to be benign.  He was referred back to [place].  He had worsening anemia and iron panel and ferritin were equivocal, but a soluble transferrin was normal, so inconsistent with iron deficiency. PET scan revealed several of the pulmonary nodules to be hypermetabolic, consistent with metastatic disease.  There was no evidence of recurrent or metastatic disease within the abdomen and pelvis.  We recommended palliative immunotherapy with  ipilimumab/nivolumab for 4 cycles to be followed by maintenance nivolumab.  He experienced severe pain of his right shoulder and down his right arm with associated swelling.  He is unable to fully lift his arm, and his right neck is also swollen. Ultrasound was negative for thrombosis.  His Port-A-Cath was then removed.  He was seen in clinic at the end of the day on July 1st., after he had been seen in the emergency department earlier in the day because of mild hemoptysis.  CTA chest with contrast revealed  pulmonary metastases with mild progression from March 2021.  The apical segment right lower lobe nodule was 17 mm, previously 10 mm.  Mild ground-glass opacity around a right lower lobe nodule measuring 22 mm, previously 14 mm.  It was unclear if this represents pseudoprogression or true progression.  He had persistentsevere right shoulder pain, despite tramadol.  Plain films of the right shoulderIn July revealed a large lucent lesion in the right humeral head with cortical destruction, as well as moderate degenerative changes of the acromioclavicular and glenohumeral joints.  CT chest, abdomen and pelvis in July revealed a destructive metastatic lesion of the right proximal humerus measuring 19 mm.  Numerous pulmonary nodules were again observed, similar to June, but new and enlarging nodules overall since March 2021.  The splenic lesion measuring 5.6 x 3.0 cm, previously 4.7 x 5.0 cm, was indeterminate, but suspicious for metastasis despite lack of FDG uptake, as it has enlarged since previous imaging.  Nivolumab was discontinued.  He saw Dr. [name] and received palliative radiation to the right humeral head lesion in July.   He was placed on denosumab every 4 weeks for the bone metastasis.   Due to his progressive disease he was switched to palliative oral chemotherapy with cabozantinib 60 mg daily in July. Cabozantinib had to be held briefly due to uncontrolled hypertension.  He also had hypopigmentation of the perioral area and upper  back.  He discontinued tramadol as this was causing severe headaches and was using hydrocodone/APAP as needed for pain, but he did not tolerate that either.  He resumed cabozantinib in August once his blood pressure was controlled.   We added mirtazapine 15 mg at bedtime for insomnia in September.  He discontinued cabozantinib in September due to progressive vitiligo.  We recommended alternative therapy with axitinib 5 mg twice daily. Repeat CT chest, abdomen and pelvis in  September prior to starting axitinib revealed a decrease in the pulmonary nodules, without evidence of new metastatic disease in the lungs or abdomen and pelvis.  He was found to have an elevated uric acid and findings consistent with gouty arthritis, which he claims he has dealt with in the past.  He had symptoms of shortness of breath, fatigue and hoarseness, as well as a single episode of hemoptysis, so he stopped his axitinib on his own. Repeat CT imaging  in November revealed mild interval progression of bilateral pulmonary metastases, and slight interval increase in size of bilateral external iliac lymph nodes although they remain within normal limits for size.  No evidence for recurrence in the nephrectomy bed was seen.  We resumed axitinib at a lower dose of 3 mg twice daily but unfortunately, he experienced the same side effects, including shortness of breath and edema. CTA chest in late November was read as a very limited study due to respiratory motion artifact. No large or central pulmonary artery embolus identified. Known pulmonary metastatic disease and aortic atherosclerosis. Axitinib was discontinued. He  eventually resumed hydrocodone/APAP 10/325 at bedtime as needed.   We then recommended treatment with everolimus 10 mg daily, which he started in January 2022. He tolerated everolimus fairly well, except for pruritus, for which he was given triamcinolone cream.  He had continued right shoulder pain, which was waking him up at night despite hydrocodone /APAP and Advil at bedtime, so we started him on MS Contin 15 mg every 12 hours.  We tried increasing the dose to 30 mg, but he felt this did not give him adequate relief and he has stopped this.  His anemia has been steadily worsening.  Evaluation revealed a normal B12 and folate, no evidence of hemolysis, but equivocal results on his iron studies.  He was given IV iron in the form of Venofer from March 15th through March 23rd.   He did undergo EGD  and colonoscopy in March.  No source of bleeding was found.  H pylori was positive, so the patient was placed on triple therapy.  CT chest, abdomen and pelvis in April revealed mild progression of bilateral pulmonary nodules compatible with metastatic disease. Index anterior right upper lobe lesion measured previously at 1.4 cm is 1.8 cm.  An 8 mm left lower lobe nodule measured previously is 7 mm.  The 1.7 cm right lower lobe nodule measured previously is 2.0 cm.  Index right lower lobe retro hilar nodule measured previously at 1.4 cm measures 2.1 cm.  No definite new pulmonary nodule or mass.  There was a similar appearance of the lytic lesion involving the right humeral head with associated joint effusion.  No evidence for local recurrence in the left nephrectomy bed. Denosumab was held in April due to mild hypocalcemia.  He was referred back to Dr. [name] for additional radiation therapy to the right humeral head and completed palliative radiation. He was also referred to Dr. Redmond Pulling, Orthopedic Oncology, at [facility].  He states he had occasional mouth sores, which resolve with MBX solution.  He states he took MS Contin 15 mg every 12 hours regularly for 3 weeks and it did not seem to help his pain, so he stopped it.   He is no longer working, so is able to take the hydrocodone/APAP more frequently and is taking 2 tablets 3 times a day.  He states this does help his pain.  I warned him about taking more than this  due to the Tylenol. He did not tolerate oxycodone very well. He is hesitant to try another long acting narcotic.  CT chest from July 18th revealed the bilateral pulmonary metastases remain fairly stable.  A few of the previously non indexed pulmonary nodules have increased in size and one has decreased from 1.8 to 1.5 cm.  No definite new suspicious pulmonary nodules or masses.  Similar size of the 4.1 cm ascending aortic aneurysm.  Dr. Magda Bernheim did curettage of the bone tumor of the right proximal  humerus on July 29th.  INTERVAL HISTORY:  Steven Peck was added onto the schedule today after calling to report hemoptysis earlier this week. This occurred for 2 days and he has had no further hemoptysis for the last 2 days. He did go to urgent care and his COVID test was negative, but he was diagnosed with acute sinusitis. He was given prescriptions of Avelox and prednisone as well as an inhaler. I instructed him to proceed with filling these prescriptions. CT chest imaging was obtained which revealed overall, widespread metastatic disease to the lungs which appears stable in  number and generally stable in size, with potential minimal progression of some right-sided pulmonary nodules. No new metastatic lesions identified in the thorax were observed. I reviewed these results with him over the phone and advised that we continue the everolimus.He is scheduled to see the dentist in November and will try to move that up. He is scheduled to return to the [facility] in 3 weeks. He denies fever, chills or other signs of infection.  He denies nausea, vomiting, bowel issues, or abdominal pain.  He denies sore throat, cough, dyspnea, or chest pain.  REVIEW OF SYSTEMS:  Review of Systems  Constitutional: Negative.  Negative for appetite change, chills, fatigue, fever and unexpected weight change.  HENT:  Negative.    Eyes: Negative.   Respiratory:  Positive for hemoptysis (currently resolved, no further hemoptysis for the last 2 days). Negative for chest tightness, cough, shortness of breath and wheezing.   Cardiovascular: Negative.  Negative for chest pain, leg swelling and palpitations.  Gastrointestinal: Negative.  Negative for abdominal distention, abdominal pain, blood in stool, constipation, diarrhea, nausea and vomiting.  Endocrine: Negative.   Genitourinary: Negative.  Negative for difficulty urinating, dysuria, frequency and hematuria.   Musculoskeletal: Negative.  Negative for arthralgias, back pain,  flank pain, gait problem and myalgias.  Skin: Negative.   Neurological: Negative.  Negative for dizziness, extremity weakness, gait problem, headaches, light-headedness, numbness, seizures and speech difficulty.  Hematological: Negative.   Psychiatric/Behavioral: Negative.  Negative for depression and sleep disturbance. The patient is not nervous/anxious.   All other systems reviewed and are negative.   VITALS:  There were no vitals taken for this visit.  Wt Readings from Last 3 Encounters:  02/20/21 215 lb 11.2 oz (97.8 kg)  01/30/21 215 lb 12.8 oz (97.9 kg)  01/15/21 213 lb (96.6 kg)    There is no height or weight on file to calculate BMI.  Performance status (ECOG): 1 - Symptomatic but completely ambulatory  PHYSICAL EXAM:  Physical Exam was deferred as this was a Telehealth visit  LABS:   CBC Latest Ref Rng & Units 02/20/2021 01/30/2021 01/15/2021  WBC - 5.6 4.2 4.7  Hemoglobin 13.5 - 17.5 11.7(A) 11.0(A) 10.0(A)  Hematocrit 41 - 53 39(A) 35(A) 32(A)  Platelets 150 - 399 328 218 273   CMP Latest Ref Rng & Units 02/20/2021 01/30/2021 01/15/2021  Glucose 70 - 99 mg/dL - - -  BUN 4 - $R'21 21 20 21  'gc$ Creatinine 0.6 - 1.3 1.0 1.0 1.1  Sodium 137 - 147 138 137 138  Potassium 3.4 - 5.3 3.9 4.4 4.5  Chloride 99 - 108 103 104 103  CO2 13 - 22 25(A) 23(A) 25(A)  Calcium 8.7 - 10.7 9.8 9.0 9.4  Alkaline Phos 25 - 125 112 87 82  AST 14 - 40 $Re'28 24 20  'eGE$ ALT 10 - 40 36 15 12    Lab Results  Component Value Date   TIBC 224 (L) 10/20/2020   TIBC 234 07/29/2020   FERRITIN 1,807 (H) 10/20/2020   FERRITIN 681 07/29/2020   IRONPCTSAT 8 (L) 10/20/2020   IRONPCTSAT TNP 07/29/2020   Lab Results  Component Value Date   LDH 302 (A) 07/29/2020    STUDIES:  No results found.   EXAM: 02/24/2021 CT CHEST WITH CONTRAST  TECHNIQUE: Multidetector CT imaging of the chest was performed during intravenous contrast administration.  CONTRAST: 60 mL of Isovue 370.  COMPARISON: Chest CT  12/15/2020  FINDINGS: Cardiovascular: Heart size is normal. There  is no significant pericardial fluid, thickening or pericardial calcification. Aortic atherosclerosis with ectasia of the ascending thoracic aorta (4.3 cm in diameter). No definite coronary artery calcifications. Mediastinum/Nodes: No pathologically enlarged mediastinal or hilar lymph nodes. Esophagus is unremarkable in appearance. No axillary lymphadenopathy. Lungs/Pleura: Multiple pulmonary nodules are again noted throughout the lungs bilaterally, stable in number and generally stable in size compared to the prior examination, with exception of some enlargement of a right upper lobe pulmonary nodule (axial image 53 of series 4) which currently measures 2.1 x 0.9 cm (previously 1.8 x 1.0 cm on prior exam 12/15/2020). The largest nodule or cluster of nodules is in the right lower lobe (axial image 66 of series 4) measuring 2.6 x 2.6 cm (2.3 x 2.6 cm on prior exam when measured in a similar fashion on axial image 66 of series 4 of that study). No definite new pulmonary nodules. No acute consolidative airspace disease. No pleural effusions. Upper Abdomen: Postoperative changes of left nephrectomy. Aortic atherosclerosis. Musculoskeletal: There are no aggressive appearing lytic or blastic lesions noted in the visualized portions of the skeleton.  IMPRESSION: 1. Overall, widespread metastatic disease to the lungs appears stable in number and generally stable in size, with potential minimal progression of some right-sided pulmonary nodules, as detailed above. No new metastatic lesions identified in the thorax on today's examination. 2. Aortic atherosclerosis with ectasia of the ascending thoracic aorta (4.3 cm in diameter). Recommend annual imaging followup by CTA or MRA. This recommendation follows 2010 ACCF/AHA/AATS/ACR/ASA/SCA/SCAI/SIR/STS/SVM Guidelines for the Diagnosis and Management of Patients with Thoracic Aortic  Disease. Circulation. 2010; 121: Z610-R604. Aortic aneurysm NOS (ICD10-I71.9).  Aortic Atherosclerosis (ICD10-I70.0).  HISTORY:   Allergies:  Allergies  Allergen Reactions   Lisinopril Swelling    Angioedema of lips Angioedema of lips Angioedema of lips Angioedema of lips   Tramadol     Other reaction(s): Other (See Comments) Other reaction(s): Other (see comments) Severe headache   Hydrochlorothiazide Swelling    Current Medications: Current Outpatient Medications  Medication Sig Dispense Refill   AFINITOR 10 MG tablet TAKE 1 TABLET DAILY 30 tablet 5   allopurinol (ZYLOPRIM) 300 MG tablet Take 1 tablet (300 mg total) by mouth daily. (Patient not taking: Reported on 02/20/2021) 30 tablet 5   ANUCORT-HC 25 MG suppository Place 25 mg rectally 2 (two) times daily as needed.     calcium citrate-vitamin D (CITRACAL+D) 315-200 MG-UNIT tablet Take 1 tablet by mouth 2 (two) times daily.     colchicine 0.6 MG tablet Take 1 tablet (0.6 mg total) by mouth 2 (two) times daily. 60 tablet 1   Denosumab (XGEVA [place]) Inject into the skin.     HYDROcodone-acetaminophen (NORCO/VICODIN) 5-325 MG tablet Take 1-2 tablets by mouth every 6 (six) hours as needed. 60 tablet 0   hydrocortisone (ANUSOL-HC) 25 MG suppository Place 1 suppository (25 mg total) rectally 2 (two) times daily. (Patient not taking: Reported on 02/20/2021) 12 suppository 0   hydrocortisone cream 1 % Apply 1 application topically 2 (two) times daily.     lactulose (CHRONULAC) 10 GM/15ML solution Take 10 g by mouth at bedtime as needed for mild constipation.     magic mouthwash w/lidocaine SOLN Take 5 mLs by mouth every 3 (three) hours as needed for mouth pain. Or throat pain; swish and spit 240 mL 3   methylPREDNISolone (MEDROL DOSEPAK) 4 MG TBPK tablet As directed on dospak (Patient not taking: Reported on 02/20/2021) 21 tablet 0   Multiple Vitamins-Minerals (MULTIVITAMIN WITH MINERALS) tablet  Take 1 tablet by mouth daily. (Patient not  taking: Reported on 02/20/2021)     NARCAN 4 MG/0.1ML LIQD nasal spray kit SMARTSIG:1 Both Nares Daily PRN     olmesartan (BENICAR) 20 MG tablet TAKE 1 TABLET BY MOUTH ONCE DAILY FOR 90 DAYS     Omega-3 1000 MG CAPS Take by mouth.     ondansetron (ZOFRAN) 4 MG tablet Take 4 mg by mouth every 8 (eight) hours as needed for nausea or vomiting.     prochlorperazine (COMPAZINE) 10 MG tablet Take 10 mg by mouth every 6 (six) hours as needed for nausea or vomiting.     triamcinolone (KENALOG) 0.025 % cream Apply 1 application topically 2 (two) times daily. (Patient not taking: Reported on 02/20/2021)     vitamin B-12 (CYANOCOBALAMIN) 100 MCG tablet Take 100 mcg by mouth daily.     No current facility-administered medications for this visit.     ASSESSMENT & PLAN:   Assessment: 1. Stage III renal cell carcinoma status post radical left nephrectomy.  Due to the stage and renal vein invasion, we recommended adjuvant therapy with sunitinib, which he could not tolerate due to severe uncontrolled hypertension , despite multiple medications.    2. Recurrent renal cell carcinoma with PET positive pulmonary nodules.  He has had multiple lines of therapy, and he did not respond to immunotherapy.  He has not tolerated most oral chemotherapies. He is now on palliative everolimus 10 mg daily and is tolerating this without significant difficulty.  This appears to be keeping his disease relatively stable.  This was on hold for a brief time due to his right shoulder surgery. CT imaging from September 27th remains stable with minimal progression of right sided pulmonary metastases   3. Destructive metastatic lesion in the right humeral head measuring 19 mm.  He is completed a second course of palliative radiation due to persistent pain and decreased range of motion. He was receiving denosumab every 4 weeks in treatment of his bony metastasis, but this was held in April due to hypocalcemia.  The patient states he has  increased pain with the denosumab, so we will hold off on further denosumab for now. He felt MS Contin did not help his pain.  He continues hydrocodone for his pain.  He underwent surgery with Dr. Redmond Pulling on July 29th and is healing well.  He does note improvement in his pain.   4.  Iron deficiency anemia, he received IV iron in the form of Venofer in March 2022.  He required transfusion of 2 units of packed red blood cells when his hemoglobin dropped to 6.2.  Other than hemorrhoidal bleeding, he denies overt blood loss.  Recent EGD and colonoscopy did not reveal obvious source of blood loss.  H pylori was positive and he was treated accordingly. The hemoglobin has improved after 2 doses of IV Feraheme prior to surgery in July.   5. Vitamin-D deficiency, he continues vitamin-D 3 2000 international units daily.   6. Hypertension, which has been very difficult to control mainly due to the targeted therapies. I recommend that he be compliant with his daily medication so that we can continue treatment.   7. Splenic lesion measuring 5.6 x 3.0 cm, which is suspicious for metastatic disease.    8.  Helicobacter Pylori infection, treated with triple therapy, which he states he did not complete in timely manner. He follows up with Dr. Lyda Jester.   9.  Pain of the left foot and ankle,  and right wrist which likely represents gout.  He continues colchicine 0.6 mg BID, and I advised that he also take allopurinol 300 mg daily.  10. Episode of hemoptysis. This could be related to his bronchitis or to his pulmonary nodules. If he has recurrent bleeding, we will refer him to the pulmonologist, Dr. Alcide Clever, to consider bronchoscopy.   11. Acute sinusitis/bronchitis. I agree with the antibiotics. The steroid should not be an issue since he is not on immunotherapy at present. I do think that his wheezing would benefit from use of the inhaler as needed.  Plan:     He continues everolimus 10 mg daily without  significant difficulty. He continues to improve from his right shoulder surgery. I did recommend that he continue his blood pressure medication daily as well as allopurinol 300 mg daily. I advised him to proceed with the antibiotics and prednisone. He has been instructed to call back if he has recurrent episodes of hemoptysis, but it could have been caused by the acute infection. We will plan to see him back in 3 weeks with a CBC, comprehensive metabolic panel for repeat evaluation. The patient understands the plans discussed today and is in agreement with them.  He knows to contact our office if he develops symptoms of worsening anemia or other concerns prior to his next appointment.  I provided 15 minutes of face-to-face time during this this encounter and > 50% was spent counseling as documented under my assessment and plan.    I, Rita Ohara, am acting as scribe for Derwood Kaplan, MD  I have reviewed this report as typed by the medical scribe, and it is complete and accurate.  [name], MD [place] [facility]

## 2021-02-27 ENCOUNTER — Encounter: Payer: Self-pay | Admitting: Oncology

## 2021-02-27 ENCOUNTER — Inpatient Hospital Stay (INDEPENDENT_AMBULATORY_CARE_PROVIDER_SITE_OTHER): Payer: Medicaid Other | Admitting: Oncology

## 2021-02-27 DIAGNOSIS — C7951 Secondary malignant neoplasm of bone: Secondary | ICD-10-CM

## 2021-02-27 DIAGNOSIS — C7802 Secondary malignant neoplasm of left lung: Secondary | ICD-10-CM

## 2021-02-27 DIAGNOSIS — C7801 Secondary malignant neoplasm of right lung: Secondary | ICD-10-CM | POA: Diagnosis not present

## 2021-02-27 DIAGNOSIS — C642 Malignant neoplasm of left kidney, except renal pelvis: Secondary | ICD-10-CM

## 2021-02-27 NOTE — Addendum Note (Signed)
Addended byDairl Ponder on: 02/27/2021 11:52 AM   Modules accepted: Orders

## 2021-03-09 ENCOUNTER — Encounter: Payer: Self-pay | Admitting: Oncology

## 2021-03-11 ENCOUNTER — Telehealth: Payer: Self-pay

## 2021-03-11 LAB — HEPATIC FUNCTION PANEL
ALT: 27 (ref 10–40)
AST: 31 (ref 14–40)
Alkaline Phosphatase: 118 (ref 25–125)
Bilirubin, Total: 0.5

## 2021-03-11 LAB — BASIC METABOLIC PANEL
BUN: 14 (ref 4–21)
CO2: 30 — AB (ref 13–22)
Chloride: 105 (ref 99–108)
Creatinine: 1.1 (ref 0.6–1.3)
Glucose: 102
Potassium: 3.6 (ref 3.4–5.3)
Sodium: 140 (ref 137–147)

## 2021-03-11 LAB — CBC AND DIFFERENTIAL
HCT: 35 — AB (ref 41–53)
Hemoglobin: 11.3 — AB (ref 13.5–17.5)
Neutrophils Absolute: 2.91
Platelets: 260 (ref 150–399)
WBC: 4.7

## 2021-03-11 LAB — CBC: RBC: 4.76 (ref 3.87–5.11)

## 2021-03-11 LAB — COMPREHENSIVE METABOLIC PANEL
Albumin: 4.1 (ref 3.5–5.0)
Calcium: 9.4 (ref 8.7–10.7)

## 2021-03-11 NOTE — Telephone Encounter (Signed)
Steven Ricciardelli,RN - I called pt back and notified him to stop the Afinitor and that we are working on referral to Dr Alcide Clever. Pt was still spitting up blood while I was on the phone with him. I advised him to go to ED. He has someone driving him there now. I notified Dr Hinton Rao and Marylu Lund of above.   Dr Hinton Rao - Yes, let's hold the Afinitor but I doubt that is the issue.  He has multiple lung lesions.  If we can find where he is bleeding, might be able to cauterize or radiate.  He did have suspicion for infection, that's why I gave ab last time.  Kelli Mosher,PA - Looking at [facility], can be related to affinitor, should we hold? Respiratory: Cough (=30%), dyspnea (=24%), dyspnea on exertion (=20%), epistaxis (5% to 22%), nasopharyngitis (=25%), oropharyngeal pain (11%), pneumonia (6% to 19%), pneumonitis (1% to 17%; may include interstitial pulmonary disease, pulmonary alveolar hemorrhage, pulmonary alveolitis, pulmonary fibrosis, pulmonary infiltrates, pulmonary toxicity, restrictive pulmonary disease), productive cough (=25%), respiratory tract infection (31%), rhinitis (=25%), upper respiratory tract infection (=25%)  Dr Hinton Rao:  We need to refer to Dr. Alcide Clever to eval, he has seen before I think.  He may need bronch or other procedure.  Kelli you see him in 1 week with labs.      Steven Stfort,RN - Pt has called to report hemoptysis. He is nervous. He actually stated, "Lord I'm dying". He just awakened from nap. The first 2 episodes were quarter size. He is also having a little come out his nose when blowing. He is still on Afinitor 10mg  po qd. Next appt due next week. Please advise.

## 2021-03-12 ENCOUNTER — Telehealth: Payer: Self-pay | Admitting: Hematology and Oncology

## 2021-03-12 NOTE — Telephone Encounter (Signed)
Called patient to notify him of appointment with Dr. Alcide Clever on October 17th at 2:30pm. Gave him the address and phone number. He is still having hemoptysis which waxes and wanes. He verbalizes understanding.

## 2021-03-17 NOTE — Progress Notes (Signed)
[facility]  [address] [place],  [place]  [address] [phone]  Clinic Day:  03/18/2021  Referring physician: [name], NP  ASSESSMENT & PLAN:   Assessment & Plan: Hemoptysis He had intermittent hemoptysis for several weeks, but then had a severe episode on October 12th. We advised him to hold everolimus. He went to the ER due to the severity. CTA chest did not reveal any evidence of pulmonary embolism. The multifocal pulmonary metastasis remained stable. There is an ascending thoracic aortic aneurysm measuring up to 4.5 cm. Refer to cardiothoracic surgery was recommended, but given his widespread metastatic disease, we will continue to follow this.  He saw Dr. Alcide Clever on Monday and is scheduled for bronchoscopy on November 11th. The hemoptysis has lessened with holding everolimus, so we will continue to hold until he has had his bronchoscopy.  Lung metastases ([place]) He was on palliative everolimus 10 mg daily. The lung metastases were stable on recent imaging. Everolimus is on hold due to the hemoptysis. The patient is reluctant to resume everolimus, so we will continue to hold treatment until after bronchoscopy. He understands this is the last line of therapy available to him.  Bone metastasis ([place]) His pain is better controlled since right shoulder surgery. He continues denosumab every 4 weeks and will proceed with that this week.  Malignant neoplasm of left kidney, except renal pelvis ([place]) He has been through multiple lines of therapy for recurrent, metastatic disease. Some therapies were discontinued due to toxicities. He was tolerating everolimus well. This is currently on hold due to hemoptysis.    The patient understands the plans discussed today and is in agreement with them.  He knows to contact our office if he develops concerns prior to his next appointment.   I provided 30 minutes of face-to-face time during this encounter and >  50% was spent counseling as documented under my assessment and plan.    Marvia Pickles, PA-C  [facility] [address] [place] [address] Dept: [phone] Dept Fax: [phone]   No orders of the defined types were placed in this encounter.     CHIEF COMPLAINT:  CC: Metastatic kidney cancer to lungs and bone with hemoptysis  Current Treatment:  Everolimus/denosumab   HISTORY OF PRESENT ILLNESS:  Steven Peck is a 55 y.o. 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  We began seeing him in May 2020 when he was referred by [name], NP, for a left renal mass, hypercalcemia and iron deficiency anemia.  He had transient hematuria.  CT abdomen and pelvis revealed marked generalized swelling of the left kidney with hyperdensity throughout the renal collecting system, renal pelvis, ureter and posterior bladder consistent with gross hemorrhage with a 6-7 cm infiltrating mass in the upper pole of the left kidney.  There was no evidence of venous invasion.  There were abnormal nodes in the retroperitoneum medial to the left kidney, up to 1.5 cm in diameter.  PET scan revealed malignancy of the kidney, but no definite evidence of metastasis.  There were two right pulmonary nodules measuring 3 mm.  MRI brain did not reveal any intracranial metastasis.  He underwent biopsy of the left kidney mass in June which revealed renal cell carcinoma.  He underwent left radical nephrectomy in August. 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  11 lymph nodes were  negative for metastasis.  Tumor extended into the renal vein, but margins were clear.  He received IV iron in the form of Feraheme in July 2020.  Due to his high risk of recurrence, he was recommended for adjuvant oral chemotherapy with sunitinib 50 mg daily for 2 weeks on and 1 week off for total of 1 year.  Unfortunately,  he developed severe hypertension secondary to sunitinib, with a blood pressure of 252/129.  He was evaluated in the emergency department and placed on clonidine 0.2 mg 3 times daily.  The hypertension was uncontrolled with this as well as other medications.  Sunitinib was therefore discontinued after only 2 weeks of therapy.     He saw Dr. [name] for routine follow-up in February 2021.  At that time, CT abdomen and pelvis revealed basilar pulmonary nodules, which had increased in size and number, and were felt to be highly suspicious for metastatic disease.  There was a persistent splenic lesion, once again felt to be benign.  He was referred back to [place].  He had worsening anemia and iron panel and ferritin were equivocal, but a soluble transferrin was normal, so inconsistent with iron deficiency. PET scan revealed several of the pulmonary nodules to be hypermetabolic, consistent with metastatic disease.  There was no evidence of recurrent or metastatic disease within the abdomen and pelvis.  We recommended palliative immunotherapy with ipilimumab/nivolumab for 4 cycles to be followed by maintenance nivolumab.  He experienced severe pain of his right shoulder and down his right arm with associated swelling.  He is unable to fully lift his arm, and his right neck is also swollen. Ultrasound was negative for thrombosis.  His Port-A-Cath was then removed.  He was seen in clinic at the end of the day on July 1st., after he had been seen in the emergency department earlier in the day because of mild hemoptysis.  CTA chest with contrast revealed pulmonary metastases with mild progression from March 2021.  The apical segment right lower lobe nodule was 17 mm, previously 10 mm.  Mild ground-glass opacity around a right lower lobe nodule measuring 22 mm, previously 14 mm.  It was unclear if this represents pseudoprogression or true progression.  He had persistent severe right shoulder pain, despite tramadol.  Plain films of  the right shoulder in July revealed a large lucent lesion in the right humeral head with cortical destruction, as well as moderate degenerative changes of the acromioclavicular and glenohumeral joints.  CT chest, abdomen and pelvis in July revealed a destructive metastatic lesion of the right proximal humerus measuring 19 mm.  Numerous pulmonary nodules were again observed, similar to June, but new and enlarging nodules overall since March 2021.  The splenic lesion measuring 5.6 x 3.0 cm, previously 4.7 x 5.0 cm, was indeterminate, but suspicious for metastasis despite lack of FDG uptake, as it has enlarged since previous imaging.  Nivolumab was discontinued.  He saw Dr. [name] and received palliative radiation to the right humeral head lesion in July.   He was placed on denosumab every 4 weeks for the bone metastasis.   Due to his progressive disease he was switched to palliative oral chemotherapy with cabozantinib 60 mg daily in July. Cabozantinib had to be held briefly due to uncontrolled hypertension.  He also had hypopigmentation of the perioral area and upper  back.  He discontinued tramadol as this was causing severe headaches and was using hydrocodone/APAP as needed for pain, but he did not tolerate that either.  He resumed cabozantinib in August once his blood pressure was controlled.   We added mirtazapine 15 mg at bedtime for insomnia in September.  He discontinued cabozantinib in September due to progressive vitiligo.  We recommended alternative therapy with axitinib 5 mg twice daily. Repeat CT chest, abdomen and pelvis in September prior to starting axitinib revealed a decrease in the pulmonary nodules, without evidence of new metastatic disease in the lungs or abdomen and pelvis.  He was found to have an elevated uric acid and findings consistent with gouty arthritis, which he claims he has dealt with in the past.  He had symptoms of shortness of breath, fatigue and hoarseness, as well as a single  episode of hemoptysis, so he stopped his axitinib on his own. Repeat CT imaging  in November revealed mild interval progression of bilateral pulmonary metastases, and slight interval increase in size of bilateral external iliac lymph nodes although they remain within normal limits for size.  No evidence for recurrence in the nephrectomy bed was seen.  We resumed axitinib at a lower dose of 3 mg twice daily but unfortunately, he experienced the same side effects, including shortness of breath and edema. CTA chest in late November was read as a very limited study due to respiratory motion artifact. No large or central pulmonary artery embolus identified. Known pulmonary metastatic disease and aortic atherosclerosis. Axitinib was discontinued. He eventually resumed hydrocodone/APAP 10/325 at bedtime as needed.   We then recommended treatment with everolimus 10 mg daily, which he started in January 2022. He tolerated everolimus fairly well, except for pruritus, for which he was given triamcinolone cream.  He had continued right shoulder pain, which was waking him up at night despite hydrocodone /APAP and Advil at bedtime, so we started him on MS Contin 15 mg every 12 hours.  We tried increasing the dose to 30 mg, but he felt this did not give him adequate relief and he has stopped this.  His anemia has been steadily worsening.  Evaluation revealed a normal B12 and folate, no evidence of hemolysis, but equivocal results on his iron studies.  He was given IV iron in the form of Venofer from March 15th through March 23rd.   He did undergo EGD and colonoscopy in March.  No source of bleeding was found.  H pylori was positive, so the patient was placed on triple therapy.  CT chest, abdomen and pelvis in April revealed mild progression of bilateral pulmonary nodules compatible with metastatic disease. Index anterior right upper lobe lesion measured previously at 1.4 cm is 1.8 cm.  An 8 mm left lower lobe nodule measured  previously is 7 mm.  The 1.7 cm right lower lobe nodule measured previously is 2.0 cm.  Index right lower lobe retro hilar nodule measured previously at 1.4 cm measures 2.1 cm.  No definite new pulmonary nodule or mass.  There was a similar appearance of the lytic lesion involving the right humeral head with associated joint effusion.  No evidence for local recurrence in the left nephrectomy bed. Denosumab was held in April due to mild hypocalcemia.  He was referred back to Dr. [name] for additional radiation therapy to the right humeral head and completed palliative radiation. He was also referred to Dr. Redmond Pulling, Orthopedic Oncology, at [facility].  He states he had occasional mouth sores, which resolve with MBX solution.  He states he took MS Contin 15 mg every 12 hours regularly for 3 weeks and it did not seem to help  his pain, so he stopped it.   He is no longer working, so is able to take the hydrocodone/APAP more frequently and is taking 2 tablets 3 times a day.  He states this does help his pain.  I warned him about taking more than this  due to the Tylenol. He did not tolerate oxycodone very well. He is hesitant to try another long acting narcotic.  CT chest from July 18th revealed the bilateral pulmonary metastases remain fairly stable.  A few of the previously non indexed pulmonary nodules have increased in size and one has decreased from 1.8 to 1.5 cm.  No definite new suspicious pulmonary nodules or masses.  Similar size of the 4.1 cm ascending aortic aneurysm.  Dr. Magda Bernheim did curettage of the bone tumor of the right proximal humerus on July 29th.  He had continued everolimus and tolerated this fairly well. He continues denosumab every 4 weeks for the bone metastasis. He was seen off schedule on September 29th after calling to report hemoptysis. This occurred for 2 days but then resolved on it's own. CT chest on September 27th revealed widespread metastatic disease to the lungs appeared to be stable  in number and generally stable in size, with potential minimal progression of some right-sided pulmonary nodules, as detailed above. No new metastatic lesions identified in the thorax.  He had gone to urgent care and his COVID test was negative, but he was diagnosed with acute sinusitis. He was given prescriptions of Avelox and prednisone, as well as an inhaler, so we instructed him to proceed with filling these prescriptions. He telephoned again last week with recurrent severe hemoptysis. Rarely hemoptysis can be associated with everolimus, so we had him hold everolimus and referred him to Dr. Alcide Clever for consideration of bronchoscopy. Due to the severity of the hemoptysis, he went to the emergency room again.  CTA chest did not reveal any evidence of pulmonary embolism. The multifocal pulmonary metastasis were stable. There is an ascending thoracic aortic aneurysm measuring up to 4.5 cm. Refer to cardiothoracic surgery was recommended, but given his widespread metastatic disease, we will continue to follow this.  INTERVAL HISTORY:  Damean is here today for repeat clinical assessment prior to denosumab. He saw Dr. Alcide Clever on Monday. The patient states he is scheduled for bronchoscopy on November 7th. He states his hemoptysis has improved since stopping everolimus, but he still has intermittent, mild hemoptysis. His cough has improved. He states the benzonatate makes him cough when he lays down.  He has been taking them benzonatate scheduled, not as needed.  So advised him he to use this only as needed. I do not feel benzonatate caused his cough when lying down.  This is possibly due to acid reflux.  I will place him back on pantoprazole 40 mg daily. He reports mild right hand swelling without pain for about 4 weeks. This may be lymphedema. If it worsens, he knows to contact us.  He denies fevers or chills. He reports his right shoulder and arm pain are fairly well controlled with medication since undergoing  surgical procedure.  His appetite is good. His weight has increased 4 pounds over last month . He states he is not taking blood pressure medication, as it was not covered by his insurance.  Apparently, many of his recently prescribed medications have not been covered by insurance, including benzonatate and allopurinol. He paid for these out of pocket, but due to limited income, states he cannot continue to do this.  I feel he needs his olmesartan 20 mg daily, so refilled this.  We will arrange for patient assistance to cover his pantoprazole and olmesartan.  REVIEW OF SYSTEMS:  Review of Systems  Constitutional:  Negative for appetite change, chills, fatigue, fever and unexpected weight change.  HENT:   Negative for lump/mass, mouth sores and sore throat.   Respiratory:  Positive for hemoptysis (mild, intermittent). Negative for cough and shortness of breath.   Cardiovascular:  Negative for chest pain and leg swelling.  Gastrointestinal:  Negative for abdominal pain, blood in stool, constipation, diarrhea, nausea and vomiting.  Genitourinary:  Negative for difficulty urinating, dysuria, frequency and hematuria.   Musculoskeletal:  Negative for arthralgias, back pain, flank pain and myalgias.  Skin:  Negative for itching, rash and wound.  Neurological:  Negative for dizziness, extremity weakness, headaches, light-headedness and numbness.  Hematological:  Negative for adenopathy.  Psychiatric/Behavioral:  Negative for depression and sleep disturbance. The patient is not nervous/anxious.     VITALS:  Blood pressure (!) 154/90, pulse 100, temperature 97.9 F (36.6 C), temperature source Oral, resp. rate 20, height _0  (1.778 m), weight 219 lb 12.8 oz (99.7 kg), SpO2 100 %.  Wt Readings from Last 3 Encounters:  03/18/21 219 lb 12.8 oz (99.7 kg)  02/20/21 215 lb 11.2 oz (97.8 kg)  01/30/21 215 lb 12.8 oz (97.9 kg)    Body mass index is 31.54 kg/m.  Performance status (ECOG): 1 - Symptomatic  but completely ambulatory  PHYSICAL EXAM:  Physical Exam Vitals and nursing note reviewed.  Constitutional:      General: He is not in acute distress.    Appearance: Normal appearance. He is normal weight.  HENT:     Head: Normocephalic and atraumatic.     Mouth/Throat:     Mouth: Mucous membranes are moist.     Pharynx: Oropharynx is clear. No oropharyngeal exudate or posterior oropharyngeal erythema.  Eyes:     General: No scleral icterus.    Extraocular Movements: Extraocular movements intact.     Conjunctiva/sclera: Conjunctivae normal.     Pupils: Pupils are equal, round, and reactive to light.  Cardiovascular:     Rate and Rhythm: Normal rate and regular rhythm.     Heart sounds: Normal heart sounds. No murmur heard.   No friction rub. No gallop.  Pulmonary:     Effort: Pulmonary effort is normal.     Breath sounds: Normal breath sounds. No wheezing, rhonchi or rales.  Abdominal:     General: Bowel sounds are normal. There is no distension.     Palpations: Abdomen is soft. There is no hepatomegaly, splenomegaly or mass.     Tenderness: There is no abdominal tenderness.  Musculoskeletal:        General: Swelling (mild, right hand swelling) present. No tenderness. Normal range of motion.     Cervical back: Normal range of motion and neck supple. No tenderness.     Right lower leg: No edema.     Left lower leg: No edema.  Lymphadenopathy:     Cervical: No cervical adenopathy.     Upper Body:     Right upper body: No supraclavicular or axillary adenopathy.     Left upper body: No supraclavicular or axillary adenopathy.     Lower Body: No right inguinal adenopathy. No left inguinal adenopathy.  Skin:    General: Skin is warm and dry.     Coloration: Skin is not jaundiced.     Findings: No  rash.  Neurological:     Mental Status: He is alert and oriented to person, place, and time.     Cranial Nerves: No cranial nerve deficit.  Psychiatric:        Mood and Affect: Mood  normal.        Behavior: Behavior normal.        Thought Content: Thought content normal.    LABS:   CBC Latest Ref Rng & Units 03/18/2021 02/20/2021 01/30/2021  WBC - 4.3 5.6 4.2  Hemoglobin 13.5 - 17.5 10.6(A) 11.7(A) 11.0(A)  Hematocrit 41 - 53 35(A) 39(A) 35(A)  Platelets 150 - 399 278 328 218   CMP Latest Ref Rng & Units 03/18/2021 02/20/2021 01/30/2021  Glucose 70 - 99 mg/dL - - -  BUN 4 - _0 Creatinine 0.6 - 1.3 1.1 1.0 1.0  Sodium 137 - 147 139 138 137  Potassium 3.4 - 5.3 3.9 3.9 4.4  Chloride 99 - 108 103 103 104  CO2 13 - 22 28(A) 25(A) 23(A)  Calcium 8.7 - 10.7 9.7 9.8 9.0  Alkaline Phos 25 - 125 108 112 87  AST 14 - 40 _1 ALT 10 - 40 13 36 15     No results found for: CEA1 / No results found for: CEA1 No results found for: PSA1 No results found for: GTX646 No results found for: CAN125  No results found for: TOTALPROTELP, ALBUMINELP, A1GS, A2GS, BETS, BETA2SER, GAMS, MSPIKE, SPEI Lab Results  Component Value Date   TIBC 224 (L) 10/20/2020   TIBC 234 07/29/2020   FERRITIN 1,807 (H) 10/20/2020   FERRITIN 681 07/29/2020   IRONPCTSAT 8 (L) 10/20/2020   IRONPCTSAT TNP 07/29/2020   Lab Results  Component Value Date   LDH 302 (A) 07/29/2020    STUDIES:  No results found.    HISTORY:   Past Medical History:  Diagnosis Date   Anemia    Asthma    as a child   Cancer ([place])    Left renal cancer   Constipation    Hemoptysis 03/18/2021   Hypertension    Malignant neoplasm of left kidney, except renal pelvis [facility])     Past Surgical History:  Procedure Laterality Date   CYSTOSCOPY N/A 01/10/2019   Procedure: CYSTOSCOPY;  Surgeon: Alexis Frock, MD;  Location: [facility];  Service: Urology;  Laterality: N/A;   EXCISION / CURETTAGE TUMOR HUMERUS Right 12/26/2020   ROBOT ASSISTED LAPAROSCOPIC NEPHRECTOMY Left 01/10/2019   Procedure: XI ROBOTIC ASSISTED LAPAROSCOPIC NEPHRECTOMY;  Surgeon: Alexis Frock, MD;  Location: [facility];  Service:  Urology;  Laterality: Left;  3 HRS    No family history on file.  Social History:  reports that he has never smoked. He has never used smokeless tobacco. He reports that he does not currently use alcohol. He reports that he does not use drugs.The patient is accompanied by significant other today.  Allergies:  Allergies  Allergen Reactions   Lisinopril Swelling    Angioedema of lips Angioedema of lips Angioedema of lips Angioedema of lips   Tramadol     Other reaction(s): Other (See Comments) Other reaction(s): Other (see comments) Severe headache   Hydrochlorothiazide Swelling    Current Medications: Current Outpatient Medications  Medication Sig Dispense Refill   allopurinol (ZYLOPRIM) 300 MG tablet Take 1 tablet (300 mg total) by mouth daily. 30 tablet 5   pantoprazole (PROTONIX) 40 MG tablet Take 1 tablet (40 mg total) by mouth daily. [place]  tablet 2   AFINITOR 10 MG tablet TAKE 1 TABLET DAILY (Patient not taking: Reported on 03/18/2021) 30 tablet 5   ANUCORT-HC 25 MG suppository Place 25 mg rectally 2 (two) times daily as needed.     benzonatate (TESSALON) 200 MG capsule Take 200 mg by mouth 3 (three) times daily as needed.     calcium citrate-vitamin D (CITRACAL+D) 315-200 MG-UNIT tablet Take 1 tablet by mouth 2 (two) times daily.     HYDROcodone-acetaminophen (NORCO/VICODIN) 5-325 MG tablet Take 1-2 tablets by mouth every 6 (six) hours as needed. 60 tablet 0   magic mouthwash w/lidocaine SOLN Take 5 mLs by mouth every 3 (three) hours as needed for mouth pain. Or throat pain; swish and spit 240 mL 3   NARCAN 4 MG/0.1ML LIQD nasal spray kit SMARTSIG:1 Both Nares Daily PRN     olmesartan (BENICAR) 20 MG tablet Take 1 tablet (20 mg total) by mouth daily. 30 tablet 2   Omega-3 1000 MG CAPS Take by mouth.     ondansetron (ZOFRAN) 4 MG tablet Take 4 mg by mouth every 8 (eight) hours as needed for nausea or vomiting. (Patient not taking: Reported on 03/18/2021)     prochlorperazine  (COMPAZINE) 10 MG tablet Take 10 mg by mouth every 6 (six) hours as needed for nausea or vomiting. (Patient not taking: Reported on 03/18/2021)     vitamin B-12 (CYANOCOBALAMIN) 100 MCG tablet Take 100 mcg by mouth daily.     No current facility-administered medications for this visit.

## 2021-03-18 ENCOUNTER — Encounter: Payer: Self-pay | Admitting: Hematology and Oncology

## 2021-03-18 ENCOUNTER — Inpatient Hospital Stay (INDEPENDENT_AMBULATORY_CARE_PROVIDER_SITE_OTHER): Payer: Medicaid Other | Admitting: Hematology and Oncology

## 2021-03-18 ENCOUNTER — Inpatient Hospital Stay: Payer: Medicaid Other | Attending: Oncology

## 2021-03-18 ENCOUNTER — Other Ambulatory Visit: Payer: Self-pay | Admitting: Hematology and Oncology

## 2021-03-18 ENCOUNTER — Telehealth: Payer: Self-pay | Admitting: Oncology

## 2021-03-18 DIAGNOSIS — C7802 Secondary malignant neoplasm of left lung: Secondary | ICD-10-CM

## 2021-03-18 DIAGNOSIS — R042 Hemoptysis: Secondary | ICD-10-CM

## 2021-03-18 DIAGNOSIS — C642 Malignant neoplasm of left kidney, except renal pelvis: Secondary | ICD-10-CM

## 2021-03-18 DIAGNOSIS — C7951 Secondary malignant neoplasm of bone: Secondary | ICD-10-CM

## 2021-03-18 DIAGNOSIS — C7801 Secondary malignant neoplasm of right lung: Secondary | ICD-10-CM

## 2021-03-18 DIAGNOSIS — Z23 Encounter for immunization: Secondary | ICD-10-CM | POA: Insufficient documentation

## 2021-03-18 HISTORY — DX: Hemoptysis: R04.2

## 2021-03-18 LAB — CBC AND DIFFERENTIAL
HCT: 35 — AB (ref 41–53)
Hemoglobin: 10.6 — AB (ref 13.5–17.5)
Neutrophils Absolute: 2.58
Platelets: 278 (ref 150–399)
WBC: 4.3

## 2021-03-18 LAB — HEPATIC FUNCTION PANEL
ALT: 13 (ref 10–40)
AST: 21 (ref 14–40)
Alkaline Phosphatase: 108 (ref 25–125)
Bilirubin, Total: 0.5

## 2021-03-18 LAB — BASIC METABOLIC PANEL
BUN: 14 (ref 4–21)
CO2: 28 — AB (ref 13–22)
Chloride: 103 (ref 99–108)
Creatinine: 1.1 (ref 0.6–1.3)
Glucose: 100
Potassium: 3.9 (ref 3.4–5.3)
Sodium: 139 (ref 137–147)

## 2021-03-18 LAB — COMPREHENSIVE METABOLIC PANEL
Albumin: 3.8 (ref 3.5–5.0)
Calcium: 9.7 (ref 8.7–10.7)

## 2021-03-18 LAB — CBC: RBC: 4.57 (ref 3.87–5.11)

## 2021-03-18 MED ORDER — PANTOPRAZOLE SODIUM 40 MG PO TBEC: 40.0000 mg | DELAYED_RELEASE_TABLET | Freq: Every day | ORAL | 2 refills | Status: DC

## 2021-03-18 MED ORDER — OLMESARTAN MEDOXOMIL 20 MG PO TABS: 20.0000 mg | ORAL_TABLET | Freq: Every day | ORAL | 2 refills | Status: AC

## 2021-03-18 NOTE — Telephone Encounter (Signed)
Per 10/19 los next appt scheduled and given to patient 

## 2021-03-18 NOTE — Assessment & Plan Note (Signed)
He had intermittent hemoptysis for several weeks, but then had a severe episode on October 12th. We advised him to hold everolimus. He went to the ER due to the severity. CTA chest did not reveal any evidence of pulmonary embolism. The multifocal pulmonary metastasis remained stable. There is an ascending thoracic aortic aneurysm measuring up to 4.5 cm. Refer to cardiothoracic surgery was recommended, but given his widespread metastatic disease, we will continue to follow this.  He saw Dr. Alcide Clever on Monday and is scheduled for bronchoscopy on November 11th. The hemoptysis has lessened with holding everolimus, so we will continue to hold until he has had his bronchoscopy.

## 2021-03-18 NOTE — Assessment & Plan Note (Signed)
He has been through multiple lines of therapy for recurrent, metastatic disease. Some therapies were discontinued due to toxicities. He was tolerating everolimus well. This is currently on hold due to hemoptysis.

## 2021-03-18 NOTE — Assessment & Plan Note (Signed)
His pain is better controlled since right shoulder surgery. He continues denosumab every 4 weeks and will proceed with that this week.

## 2021-03-18 NOTE — Assessment & Plan Note (Addendum)
He was on palliative everolimus 10 mg daily. The lung metastases were stable on recent imaging. Everolimus is on hold due to the hemoptysis. The patient is reluctant to resume everolimus, so we will continue to hold treatment until after bronchoscopy. He understands this is the last line of therapy available to him.

## 2021-03-19 ENCOUNTER — Encounter: Payer: Self-pay | Admitting: Hematology and Oncology

## 2021-03-19 ENCOUNTER — Encounter: Payer: Self-pay | Admitting: Oncology

## 2021-03-20 ENCOUNTER — Ambulatory Visit: Payer: Medicaid Other | Admitting: Hematology and Oncology

## 2021-03-20 ENCOUNTER — Other Ambulatory Visit: Payer: Medicaid Other

## 2021-03-25 ENCOUNTER — Encounter: Payer: Self-pay | Admitting: Oncology

## 2021-03-30 ENCOUNTER — Inpatient Hospital Stay: Payer: Medicaid Other

## 2021-03-30 ENCOUNTER — Telehealth: Payer: Self-pay

## 2021-03-30 ENCOUNTER — Other Ambulatory Visit: Payer: Self-pay | Admitting: Oncology

## 2021-03-30 ENCOUNTER — Other Ambulatory Visit: Payer: Self-pay

## 2021-03-30 VITALS — BP 155/90 | HR 92 | Temp 98.1°F | Resp 20 | Ht 70.0 in | Wt 220.0 lb

## 2021-03-30 DIAGNOSIS — K047 Periapical abscess without sinus: Secondary | ICD-10-CM

## 2021-03-30 DIAGNOSIS — Z23 Encounter for immunization: Secondary | ICD-10-CM | POA: Diagnosis present

## 2021-03-30 MED ORDER — AMOXICILLIN 500 MG PO CAPS: 500.0000 mg | ORAL_CAPSULE | Freq: Three times a day (TID) | ORAL | 1 refills | Status: DC

## 2021-03-30 MED ORDER — INFLUENZA VAC SPLIT QUAD 0.5 ML IM SUSY
0.5000 mL | PREFILLED_SYRINGE | Freq: Once | INTRAMUSCULAR | Status: AC
  Administered 2021-03-30: 0.5 mL via INTRAMUSCULAR
  Filled 2021-03-30: qty 0.5

## 2021-03-30 NOTE — Patient Instructions (Signed)
Influenza Virus Vaccine injection What is this medication? INFLUENZA VIRUS VACCINE (in floo EN zuh VAHY ruhs vak SEEN) helps to reduce the risk of getting influenza also known as the flu. The vaccine only helps protect you against some strains of the flu. This medicine may be used for other purposes; ask your health care provider or pharmacist if you have questions. COMMON BRAND NAME(S): Afluria, Afluria Quadrivalent, Agriflu, Alfuria, FLUAD, FLUAD Quadrivalent, Fluarix, Fluarix Quadrivalent, Flublok, Flublok Quadrivalent, FLUCELVAX, FLUCELVAX Quadrivalent, Flulaval, Flulaval Quadrivalent, Fluvirin, Fluzone, Fluzone High-Dose, Fluzone Intradermal, Fluzone Quadrivalent What should I tell my care team before I take this medication? They need to know if you have any of these conditions: bleeding disorder like hemophilia fever or infection Guillain-Barre syndrome or other neurological problems immune system problems infection with the human immunodeficiency virus (HIV) or AIDS low blood platelet counts multiple sclerosis an unusual or allergic reaction to influenza virus vaccine, latex, other medicines, foods, dyes, or preservatives. Different brands of vaccines contain different allergens. Some may contain latex or eggs. Talk to your doctor about your allergies to make sure that you get the right vaccine. pregnant or trying to get pregnant breast-feeding How should I use this medication? This vaccine is for injection into a muscle or under the skin. It is given by a health care professional. A copy of Vaccine Information Statements will be given before each vaccination. Read this sheet carefully each time. The sheet may change frequently. Talk to your healthcare provider to see which vaccines are right for you. Some vaccines should not be used in all age groups. Overdosage: If you think you have taken too much of this medicine contact a poison control center or emergency room at once. NOTE: This  medicine is only for you. Do not share this medicine with others. What if I miss a dose? This does not apply. What may interact with this medication? chemotherapy or radiation therapy medicines that lower your immune system like etanercept, anakinra, infliximab, and adalimumab medicines that treat or prevent blood clots like warfarin phenytoin steroid medicines like prednisone or cortisone theophylline vaccines This list may not describe all possible interactions. Give your health care provider a list of all the medicines, herbs, non-prescription drugs, or dietary supplements you use. Also tell them if you smoke, drink alcohol, or use illegal drugs. Some items may interact with your medicine. What should I watch for while using this medication? Report any side effects that do not go away within 3 days to your doctor or health care professional. Call your health care provider if any unusual symptoms occur within 6 weeks of receiving this vaccine. You may still catch the flu, but the illness is not usually as bad. You cannot get the flu from the vaccine. The vaccine will not protect against colds or other illnesses that may cause fever. The vaccine is needed every year. What side effects may I notice from receiving this medication? Side effects that you should report to your doctor or health care professional as soon as possible: allergic reactions like skin rash, itching or hives, swelling of the face, lips, or tongue Side effects that usually do not require medical attention (report to your doctor or health care professional if they continue or are bothersome): fever headache muscle aches and pains pain, tenderness, redness, or swelling at the injection site tiredness Side effects that you should report to your doctor or health care professional as soon as possible: allergic reactions like skin rash, itching or hives, swelling of   the face, lips, or tongue Side effects that usually do not  require medical attention (report to your doctor or health care professional if they continue or are bothersome): fever headache muscle aches and pains pain, tenderness, redness, or swelling at the injection site tiredness This list may not describe all possible side effects. Call your doctor for medical advice about side effects. You may report side effects to FDA at 1-800-FDA-1088. Where should I keep my medication? The vaccine will be given by a health care professional in a clinic, pharmacy, doctor's office, or other health care setting. You will not be given vaccine doses to store at home. NOTE: This sheet is a summary. It may not cover all possible information. If you have questions about this medicine, talk to your doctor, pharmacist, or health care provider.  2022 [employer] Standard (2020-01-22 19:49:22)  

## 2021-03-30 NOTE — Progress Notes (Signed)
Discharged home, stable- Patient prefers to return for another visit if Delton See is needed this month.

## 2021-03-30 NOTE — Telephone Encounter (Addendum)
RE: Pt req Amoxicillin for tooth ache Received: Today Derwood Kaplan, MD  Dairl Ponder, RN Okay, will do   RE: Pt req Amoxicillin for tooth ache Received: Today Derwood Kaplan, MD  Dairl Ponder, RN Okay, went to send in and he has 3 pharmacies, [facility], [place].  Is it one of those or do you need to enter another pharmacy?        Previous Messages   ----- Message -----  From: Dairl Ponder, RN  Sent: 03/30/2021  11:25 AM EDT  To: Derwood Kaplan, MD  Subject: Pt req Amoxicillin for tooth ache               Pt called to ask if we would send him in some amoxicillin for his left upper tooth. He is scheduled to have dental extractions next month. When he called the dentist for refill, they told him they didn't give him the antibiotic initially. Pt uses [facility] if you will send him something in.

## 2021-03-31 NOTE — Progress Notes (Signed)
Delton See continues to be on hold per Dr. Hinton Rao.  Raul Del [place], [place], BCPS, BCOP 03/30/2021  9:30 AM

## 2021-04-13 DIAGNOSIS — R000 Tachycardia, unspecified: Secondary | ICD-10-CM

## 2021-04-13 NOTE — Progress Notes (Signed)
Steven Peck  [address] [place],  [place]  [address] [phone]  Clinic Day:  04/20/2021  Referring physician: [name], NP  This document serves as a record of services personally performed by Steven Poisson, MD. It was created on their behalf by Peck,Steven E, a trained medical scribe. The creation of this record is based on the scribe's personal observations and the provider's statements to them.  ASSESSMENT & PLAN:   Assessment & Plan: Hemoptysis He had intermittent hemoptysis for several weeks, but then had a severe episode on October 12th. We advised him to hold everolimus. He went to the ER due to the severity. CTA chest did not reveal any evidence of pulmonary embolism. The multifocal pulmonary metastasis remained stable. There is an ascending thoracic aortic aneurysm measuring up to 4.5 cm. Bronchoscopy with Dr. Alcide Clever from November 11th revealed obstructed right lower lobes by mucus plug with tumor.  Biopsies were taken and pathology confirmed metastatic carcinoma, consistent with primary renal cell carcinoma. He may benefit from palliative radiation, so I will speak with Dr. [name] and Dr. Alcide Clever. He denies further episodes of hemoptysis.  Lung metastases ([place]) He was on palliative everolimus 10 mg daily. The lung metastases were stable on recent imaging. Everolimus remains on hold at this time, but we will probably resume it once we resolve the hemoptysis. He understands this is the last line of therapy available to him.  Bone metastasis ([place]) His pain is better controlled since right shoulder surgery. The denosumab  has been on hold, mainly because he had generalized pain after this. He is willing to try it again.  Malignant neoplasm of left kidney, except renal pelvis ([place]) He has been through multiple lines of therapy for recurrent, metastatic disease. Some therapies were discontinued due to toxicities. He was tolerating  everolimus well, but this is currently on hold due to hemoptysis.   Hypercalcemia This is mild and asymptomatic but I am concerned that this is caused by his malignancy. We will plan to administer denosumab this week. If he can tolerate it, I would like to continue this monthly.  In view of his hypercalcemia, we will plan to administer denosumab this week. As recent bronchoscopy has revealed obstruction of the right lower lobes by mucus plug with tumor, he may benefit from palliative radiation. I will plan to speak with Dr. [name] and Dr. Alcide Clever regarding this. He is scheduled with Dr. Alcide Clever tomorrow. He denies any other episodes of hemoptysis, but we will continue to hold everolimus at this time. We will see him back in 4 weeks with CBC, CMP and iron studies for repeat evaluation. The patient understands the plans discussed today and is in agreement with them.  He knows to contact our office if he develops concerns prior to his next appointment.  I provided 20 minutes of face-to-face time during this encounter and > 50% was spent counseling as documented under my assessment and plan.    [facility] [address] [place] [address] Dept: [phone] Dept Fax: [phone]   No orders of the defined types were placed in this encounter.     CHIEF COMPLAINT:  CC: Metastatic kidney cancer to lungs and bone with hemoptysis  Current Treatment:  Everolimus/denosumab   HISTORY OF PRESENT ILLNESS:  Steven Peck is a 55 y.o. 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  We began seeing him  in May 2020 when he was referred by [name], NP, for a left renal mass, hypercalcemia and iron deficiency anemia.  He had transient hematuria.  CT abdomen and pelvis revealed marked generalized swelling of the left kidney with hyperdensity throughout the renal collecting system, renal pelvis, ureter and  posterior bladder consistent with gross hemorrhage with a 6-7 cm infiltrating mass in the upper pole of the left kidney.  There was no evidence of venous invasion.  There were abnormal nodes in the retroperitoneum medial to the left kidney, up to 1.5 cm in diameter.  PET scan revealed malignancy of the kidney, but no definite evidence of metastasis.  There were two right pulmonary nodules measuring 3 mm.  MRI brain did not reveal any intracranial metastasis.  He underwent biopsy of the left kidney mass in June which revealed renal cell carcinoma.  He underwent left radical nephrectomy in August. 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  11 lymph nodes were negative for metastasis.  Tumor extended into the renal vein, but margins were clear.  He received IV iron in the form of Feraheme in July 2020.  Due to his high risk of recurrence, he was recommended for adjuvant oral chemotherapy with sunitinib 50 mg daily for 2 weeks on and 1 week off for total of 1 year.  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  He was evaluated in the emergency department and placed on clonidine 0.2 mg 3 times daily.  The hypertension was uncontrolled with this as well as other medications.  Sunitinib was therefore discontinued after only 2 weeks of therapy.     He saw Dr. [name] for routine follow-up in February 2021.  At that time, CT abdomen and pelvis revealed basilar pulmonary nodules, which had increased in size and number, and were felt to be highly suspicious for metastatic disease.  There was a persistent splenic lesion, once again felt to be benign.  He was referred back to [place].  He had worsening anemia and iron panel and ferritin were equivocal, but a soluble transferrin was normal, so inconsistent with iron deficiency. PET scan revealed several of the pulmonary nodules to be hypermetabolic, consistent with metastatic disease.  There was  no evidence of recurrent or metastatic disease within the abdomen and pelvis.  We recommended palliative immunotherapy with ipilimumab/nivolumab for 4 cycles to be followed by maintenance nivolumab.  He experienced severe pain of his right shoulder and down his right arm with associated swelling.  He is unable to fully lift his arm, and his right neck is also swollen. Ultrasound was negative for thrombosis.  His Port-A-Cath was then removed.  He was seen in clinic at the end of the day on July 1st., after he had been seen in the emergency department earlier in the day because of mild hemoptysis.  CTA chest with contrast revealed pulmonary metastases with mild progression from March 2021.  The apical segment right lower lobe nodule was 17 mm, previously 10 mm.  Mild ground-glass opacity around a right lower lobe nodule measuring 22 mm, previously 14 mm.  It was unclear if this represents pseudoprogression or true progression.  He had persistent severe right shoulder pain, despite tramadol.  Plain films of the right shoulder in July revealed a large lucent lesion in the right humeral head with cortical destruction, as well as moderate degenerative changes of the acromioclavicular and glenohumeral joints.  CT chest, abdomen and  pelvis in July revealed a destructive metastatic lesion of the right proximal humerus measuring 19 mm.  Numerous pulmonary nodules were again observed, similar to June, but new and enlarging nodules overall since March 2021.  The splenic lesion measuring 5.6 x 3.0 cm, previously 4.7 x 5.0 cm, was indeterminate, but suspicious for metastasis despite lack of FDG uptake, as it has enlarged since previous imaging.  Nivolumab was discontinued.  He saw Dr. [name] and received palliative radiation to the right humeral head lesion in July.   He was placed on denosumab every 4 weeks for the bone metastasis.   Due to his progressive disease he was switched to palliative oral chemotherapy with  cabozantinib 60 mg daily in July. Cabozantinib had to be held briefly due to uncontrolled hypertension.  He also had hypopigmentation of the perioral area and upper  back.  He discontinued tramadol as this was causing severe headaches and was using hydrocodone/APAP as needed for pain, but he did not tolerate that either.  He resumed cabozantinib in August once his blood pressure was controlled.   We added mirtazapine 15 mg at bedtime for insomnia in September.  He discontinued cabozantinib in September due to progressive vitiligo.  We recommended alternative therapy with axitinib 5 mg twice daily. Repeat CT chest, abdomen and pelvis in September prior to starting axitinib revealed a decrease in the pulmonary nodules, without evidence of new metastatic disease in the lungs or abdomen and pelvis.  He was found to have an elevated uric acid and findings consistent with gouty arthritis, which he claims he has dealt with in the past.  He had symptoms of shortness of breath, fatigue and hoarseness, as well as a single episode of hemoptysis, so he stopped his axitinib on his own. Repeat CT imaging  in November revealed mild interval progression of bilateral pulmonary metastases, and slight interval increase in size of bilateral external iliac lymph nodes although they remain within normal limits for size.  No evidence for recurrence in the nephrectomy bed was seen.  We resumed axitinib at a lower dose of 3 mg twice daily but unfortunately, he experienced the same side effects, including shortness of breath and edema. CTA chest in late November was read as a very limited study due to respiratory motion artifact. No large or central pulmonary artery embolus identified. Known pulmonary metastatic disease and aortic atherosclerosis. Axitinib was discontinued. He eventually resumed hydrocodone/APAP 10/325 at bedtime as needed.   We then recommended treatment with everolimus 10 mg daily, which he started in January 2022. He  tolerated everolimus fairly well, except for pruritus, for which he was given triamcinolone cream.  He had continued right shoulder pain, which was waking him up at night despite hydrocodone /APAP and Advil at bedtime, so we started him on MS Contin 15 mg every 12 hours.  We tried increasing the dose to 30 mg, but he felt this did not give him adequate relief and he has stopped this.  His anemia has been steadily worsening.  Evaluation revealed a normal B12 and folate, no evidence of hemolysis, but equivocal results on his iron studies.  He was given IV iron in the form of Venofer from March 15th through March 23rd.   He did undergo EGD and colonoscopy in March.  No source of bleeding was found.  H pylori was positive, so the patient was placed on triple therapy.  CT chest, abdomen and pelvis in April revealed mild progression of bilateral pulmonary nodules compatible with metastatic  disease. Index anterior right upper lobe lesion measured previously at 1.4 cm is 1.8 cm.  An 8 mm left lower lobe nodule measured previously is 7 mm.  The 1.7 cm right lower lobe nodule measured previously is 2.0 cm.  Index right lower lobe retro hilar nodule measured previously at 1.4 cm measures 2.1 cm.  No definite new pulmonary nodule or mass.  There was a similar appearance of the lytic lesion involving the right humeral head with associated joint effusion.  No evidence for local recurrence in the left nephrectomy bed. Denosumab was held in April due to mild hypocalcemia.  He was referred back to Dr. [name] for additional radiation therapy to the right humeral head and completed palliative radiation. He was also referred to Dr. Redmond Pulling, Orthopedic Oncology, at [facility].  He states he had occasional mouth sores, which resolve with MBX solution.  He states he took MS Contin 15 mg every 12 hours regularly for 3 weeks and it did not seem to help his pain, so he stopped it.   He is no longer working, so is able to take the  hydrocodone/APAP more frequently and is taking 2 tablets 3 times a day.  He states this does help his pain.  I warned him about taking more than this  due to the Tylenol. He did not tolerate oxycodone very well. He is hesitant to try another long acting narcotic.  CT chest from July 18th revealed the bilateral pulmonary metastases remain fairly stable.  A few of the previously non indexed pulmonary nodules have increased in size and one has decreased from 1.8 to 1.5 cm.  No definite new suspicious pulmonary nodules or masses.  Similar size of the 4.1 cm ascending aortic aneurysm.  Dr. Magda Bernheim did curettage of the bone tumor of the right proximal humerus on July 29th.  He had continued everolimus and tolerated this fairly well. He continues denosumab every 4 weeks for the bone metastasis. He was seen off schedule on September 29th after calling to report hemoptysis. This occurred for 2 days but then resolved on it's own. CT chest on September 27th revealed widespread metastatic disease to the lungs appeared to be stable in number and generally stable in size, with potential minimal progression of some right-sided pulmonary nodules, as detailed above. No new metastatic lesions identified in the thorax.  He had gone to urgent care and his COVID test was negative, but he was diagnosed with acute sinusitis. He was given prescriptions of Avelox and prednisone, as well as an inhaler, so we instructed him to proceed with filling these prescriptions. He telephoned again last week with recurrent severe hemoptysis. Rarely hemoptysis can be associated with everolimus, so we had him hold everolimus and referred him to Dr. Alcide Clever for consideration of bronchoscopy. Due to the severity of the hemoptysis, he went to the emergency room again.  CTA chest did not reveal any evidence of pulmonary embolism. The multifocal pulmonary metastasis were stable. There is an ascending thoracic aortic aneurysm measuring up to 4.5 cm. Refer  to cardiothoracic surgery was recommended, but given his widespread metastatic disease, we will continue to follow this.  INTERVAL HISTORY:  Trevel is here for routine follow up and states that he is doing well. The everolimus has been on hold since October 19th, but his most recent scans were stable. 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 He states that he is doing well from his right shoulder surgery. However, he underwent bronchoscopy with Dr.  Chodri on November 11th which revealed obstructed subsegmental bronchi of the right lower lobe apical segment and right lower lobe lateral basal segment by mucus plug with tumor.  Biopsies were taken and pathology is confirmed metastatic carcinoma, consistent with primary renal cell carcinoma. Blood counts and chemistries are unremarkable except for a hemoglobin of 10.8, stable and a calcium of 11.4. The hypercalcemia is a new finding. His  appetite is good, and he has lost 2 pounds since his last visit.  He denies fever, chills or other signs of infection.  He denies nausea, vomiting, bowel issues, or abdominal pain.  He denies sore throat, cough, dyspnea, or chest pain.  REVIEW OF SYSTEMS:  Review of Systems  Constitutional: Negative.  Negative for appetite change, chills, fatigue, fever and unexpected weight change.  HENT:  Negative.    Eyes: Negative.   Respiratory: Negative.  Negative for chest tightness, cough, hemoptysis, shortness of breath and wheezing.   Cardiovascular: Negative.  Negative for chest pain, leg swelling and palpitations.  Gastrointestinal: Negative.  Negative for abdominal distention, abdominal pain, blood in stool, constipation, diarrhea, nausea and vomiting.  Endocrine: Negative.   Genitourinary: Negative.  Negative for difficulty urinating, dysuria, frequency and hematuria.   Musculoskeletal: Negative.  Negative for arthralgias, back pain, flank pain, gait problem and myalgias.  Skin: Negative.   Neurological:   Positive for numbness (of the bilateral feet). Negative for dizziness, extremity weakness, gait problem, headaches, light-headedness, seizures and speech difficulty.  Hematological: Negative.   Psychiatric/Behavioral: Negative.  Negative for depression and sleep disturbance. The patient is not nervous/anxious.   All other systems reviewed and are negative.   VITALS:  Blood pressure (!) 130/91, pulse (!) 106, temperature 97.9 F (36.6 C), temperature source Oral, resp. rate 18, height $RemoveBe'5\' 10"'ljwkTAfVH$  (1.778 m), weight 217 lb 14.4 oz (98.8 kg), SpO2 98 %.  Wt Readings from Last 3 Encounters:  04/20/21 217 lb 14.4 oz (98.8 kg)  03/30/21 220 lb (99.8 kg)  03/18/21 219 lb 12.8 oz (99.7 kg)    Body mass index is 31.27 kg/m.  Performance status (ECOG): 1 - Symptomatic but completely ambulatory  PHYSICAL EXAM:  Physical Exam Constitutional:      General: He is not in acute distress.    Appearance: Normal appearance. He is normal weight.  HENT:     Head: Normocephalic and atraumatic.  Eyes:     General: No scleral icterus.    Extraocular Movements: Extraocular movements intact.     Conjunctiva/sclera: Conjunctivae normal.     Pupils: Pupils are equal, round, and reactive to light.  Cardiovascular:     Rate and Rhythm: Regular rhythm. Tachycardia present.     Pulses: Normal pulses.     Heart sounds: Normal heart sounds. No murmur heard.   No friction rub. No gallop.  Pulmonary:     Effort: Pulmonary effort is normal. No respiratory distress.     Breath sounds: Normal breath sounds.  Abdominal:     General: Bowel sounds are normal. There is no distension.     Palpations: Abdomen is soft. There is no hepatomegaly, splenomegaly or mass.     Tenderness: There is no abdominal tenderness.  Musculoskeletal:        General: Normal range of motion.     Cervical back: Normal range of motion and neck supple.     Right lower leg: No edema.     Left lower leg: No edema.  Lymphadenopathy:      Cervical: No cervical adenopathy.  Skin:    General: Skin is warm and dry.  Neurological:     General: No focal deficit present.     Mental Status: He is alert and oriented to person, place, and time. Mental status is at baseline.  Psychiatric:        Mood and Affect: Mood normal.        Behavior: Behavior normal.        Thought Content: Thought content normal.        Judgment: Judgment normal.    LABS:   CBC Latest Ref Rng & Units 04/20/2021 03/18/2021 03/11/2021  WBC - 4.8 4.3 4.7  Hemoglobin 13.5 - 17.5 10.8(A) 10.6(A) 11.3(A)  Hematocrit 41 - 53 35(A) 35(A) 35(A)  Platelets 150 - 399 300 278 260   CMP Latest Ref Rng & Units 04/20/2021 03/18/2021 03/11/2021  Glucose 70 - 99 mg/dL - - -  BUN 4 - $R'21 15 14 14  'ic$ Creatinine 0.6 - 1.3 1.1 1.1 1.1  Sodium 137 - 147 137 139 140  Potassium 3.4 - 5.3 3.9 3.9 3.6  Chloride 99 - 108 101 103 105  CO2 13 - 22 26(A) 28(A) 30(A)  Calcium 8.7 - 10.7 11.4(A) 9.7 9.4  Alkaline Phos 25 - 125 75 108 118  AST 14 - 40 $Re'20 21 31  'ZSV$ ALT 10 - 40 $Re'14 13 27     'JZK$ No results found for: CEA1 / No results found for: CEA1  Lab Results  Component Value Date   TIBC 224 (L) 10/20/2020   TIBC 234 07/29/2020   FERRITIN 1,807 (H) 10/20/2020   FERRITIN 681 07/29/2020   IRONPCTSAT 8 (L) 10/20/2020   IRONPCTSAT TNP 07/29/2020   Lab Results  Component Value Date   LDH 302 (A) 07/29/2020    STUDIES:  No results found.    HISTORY:   Allergies:  Allergies  Allergen Reactions   Lisinopril Swelling    Angioedema of lips Angioedema of lips Angioedema of lips Angioedema of lips   Tramadol     Other reaction(s): Other (See Comments) Other reaction(s): Other (see comments) Severe headache   Hydrochlorothiazide Swelling    Current Medications: Current Outpatient Medications  Medication Sig Dispense Refill   Multiple Vitamins-Minerals (MULTIVITAMIN WITH MINERALS) tablet Take 1 tablet by mouth daily.     AFINITOR 10 MG tablet TAKE 1 TABLET DAILY  (Patient not taking: Reported on 03/18/2021) 30 tablet 5   allopurinol (ZYLOPRIM) 300 MG tablet Take 1 tablet (300 mg total) by mouth daily. 30 tablet 5   amoxicillin (AMOXIL) 500 MG capsule Take 1 capsule (500 mg total) by mouth 3 (three) times daily. 30 capsule 1   ANUCORT-HC 25 MG suppository Place 25 mg rectally 2 (two) times daily as needed.     benzonatate (TESSALON) 200 MG capsule Take 200 mg by mouth 3 (three) times daily as needed.     calcium citrate-vitamin D (CITRACAL+D) 315-200 MG-UNIT tablet Take 1 tablet by mouth 2 (two) times daily.     colchicine 0.6 MG tablet Take 0.6 mg by mouth 2 (two) times daily.     HYDROcodone-acetaminophen (NORCO/VICODIN) 5-325 MG tablet Take 1-2 tablets by mouth every 6 (six) hours as needed. 60 tablet 0   magic mouthwash w/lidocaine SOLN Take 5 mLs by mouth every 3 (three) hours as needed for mouth pain. Or throat pain; swish and spit 240 mL 3   NARCAN 4 MG/0.1ML LIQD nasal spray kit SMARTSIG:1 Both Nares Daily PRN     olmesartan (BENICAR)  20 MG tablet Take 1 tablet (20 mg total) by mouth daily. 30 tablet 2   Omega-3 1000 MG CAPS Take by mouth.     ondansetron (ZOFRAN) 4 MG tablet Take 4 mg by mouth every 8 (eight) hours as needed for nausea or vomiting. (Patient not taking: Reported on 03/18/2021)     pantoprazole (PROTONIX) 40 MG tablet Take 1 tablet (40 mg total) by mouth daily. 30 tablet 2   prochlorperazine (COMPAZINE) 10 MG tablet Take 10 mg by mouth every 6 (six) hours as needed for nausea or vomiting. (Patient not taking: Reported on 03/18/2021)     vitamin B-12 (CYANOCOBALAMIN) 100 MCG tablet Take 100 mcg by mouth daily.     No current facility-administered medications for this visit.    I, Rita Ohara, am acting as scribe for Derwood Kaplan, MD  I have reviewed this report as typed by the medical scribe, and it is complete and accurate.

## 2021-04-15 ENCOUNTER — Other Ambulatory Visit: Payer: Medicaid Other

## 2021-04-15 ENCOUNTER — Other Ambulatory Visit: Payer: Self-pay

## 2021-04-15 ENCOUNTER — Ambulatory Visit: Payer: Medicaid Other | Admitting: Oncology

## 2021-04-15 DIAGNOSIS — C7951 Secondary malignant neoplasm of bone: Secondary | ICD-10-CM

## 2021-04-15 DIAGNOSIS — M25511 Pain in right shoulder: Secondary | ICD-10-CM

## 2021-04-15 DIAGNOSIS — G893 Neoplasm related pain (acute) (chronic): Secondary | ICD-10-CM

## 2021-04-15 MED ORDER — HYDROCODONE-ACETAMINOPHEN 5-325 MG PO TABS: 1.0000 | ORAL_TABLET | Freq: Four times a day (QID) | ORAL | 0 refills | Status: DC | PRN

## 2021-04-20 ENCOUNTER — Other Ambulatory Visit: Payer: Self-pay | Admitting: Oncology

## 2021-04-20 ENCOUNTER — Encounter: Payer: Self-pay | Admitting: Oncology

## 2021-04-20 ENCOUNTER — Inpatient Hospital Stay (INDEPENDENT_AMBULATORY_CARE_PROVIDER_SITE_OTHER): Payer: Medicaid Other | Admitting: Oncology

## 2021-04-20 ENCOUNTER — Inpatient Hospital Stay: Payer: Medicaid Other | Attending: Oncology | Admitting: Hematology and Oncology

## 2021-04-20 VITALS — BP 130/91 | HR 106 | Temp 97.9°F | Resp 18 | Ht 70.0 in | Wt 217.9 lb

## 2021-04-20 DIAGNOSIS — C7802 Secondary malignant neoplasm of left lung: Secondary | ICD-10-CM

## 2021-04-20 DIAGNOSIS — Z7983 Long term (current) use of bisphosphonates: Secondary | ICD-10-CM | POA: Insufficient documentation

## 2021-04-20 DIAGNOSIS — C7801 Secondary malignant neoplasm of right lung: Secondary | ICD-10-CM

## 2021-04-20 DIAGNOSIS — D500 Iron deficiency anemia secondary to blood loss (chronic): Secondary | ICD-10-CM | POA: Diagnosis not present

## 2021-04-20 DIAGNOSIS — Z923 Personal history of irradiation: Secondary | ICD-10-CM | POA: Insufficient documentation

## 2021-04-20 DIAGNOSIS — C642 Malignant neoplasm of left kidney, except renal pelvis: Secondary | ICD-10-CM | POA: Diagnosis not present

## 2021-04-20 DIAGNOSIS — G4700 Insomnia, unspecified: Secondary | ICD-10-CM | POA: Insufficient documentation

## 2021-04-20 DIAGNOSIS — R042 Hemoptysis: Secondary | ICD-10-CM | POA: Insufficient documentation

## 2021-04-20 DIAGNOSIS — C7951 Secondary malignant neoplasm of bone: Secondary | ICD-10-CM

## 2021-04-20 DIAGNOSIS — Z905 Acquired absence of kidney: Secondary | ICD-10-CM | POA: Insufficient documentation

## 2021-04-20 DIAGNOSIS — D649 Anemia, unspecified: Secondary | ICD-10-CM | POA: Insufficient documentation

## 2021-04-20 DIAGNOSIS — I10 Essential (primary) hypertension: Secondary | ICD-10-CM | POA: Insufficient documentation

## 2021-04-20 DIAGNOSIS — I712 Thoracic aortic aneurysm, without rupture, unspecified: Secondary | ICD-10-CM | POA: Insufficient documentation

## 2021-04-20 DIAGNOSIS — Z79899 Other long term (current) drug therapy: Secondary | ICD-10-CM | POA: Insufficient documentation

## 2021-04-20 LAB — CBC: RBC: 4.35 (ref 3.87–5.11)

## 2021-04-20 LAB — CBC AND DIFFERENTIAL
HCT: 35 — AB (ref 41–53)
Hemoglobin: 10.8 — AB (ref 13.5–17.5)
Neutrophils Absolute: 2.83
Platelets: 300 (ref 150–399)
WBC: 4.8

## 2021-04-20 LAB — HEPATIC FUNCTION PANEL
ALT: 14 (ref 10–40)
AST: 20 (ref 14–40)
Alkaline Phosphatase: 75 (ref 25–125)
Bilirubin, Total: 0.5

## 2021-04-20 LAB — BASIC METABOLIC PANEL
BUN: 15 (ref 4–21)
CO2: 26 — AB (ref 13–22)
Chloride: 101 (ref 99–108)
Creatinine: 1.1 (ref 0.6–1.3)
Glucose: 112
Potassium: 3.9 (ref 3.4–5.3)
Sodium: 137 (ref 137–147)

## 2021-04-20 LAB — COMPREHENSIVE METABOLIC PANEL
Albumin: 4.2 (ref 3.5–5.0)
Calcium: 11.4 — AB (ref 8.7–10.7)

## 2021-04-20 NOTE — Addendum Note (Signed)
Addended by: Juanetta Beets on: 04/20/2021 09:19 PM   Modules accepted: Orders

## 2021-04-21 ENCOUNTER — Encounter: Payer: Self-pay | Admitting: Oncology

## 2021-04-21 ENCOUNTER — Other Ambulatory Visit: Payer: Self-pay

## 2021-04-21 ENCOUNTER — Inpatient Hospital Stay: Payer: Medicaid Other

## 2021-04-21 VITALS — BP 124/80 | HR 100 | Temp 97.6°F | Resp 18 | Ht 70.0 in | Wt 216.5 lb

## 2021-04-21 DIAGNOSIS — C7951 Secondary malignant neoplasm of bone: Secondary | ICD-10-CM

## 2021-04-21 DIAGNOSIS — C7801 Secondary malignant neoplasm of right lung: Secondary | ICD-10-CM | POA: Diagnosis present

## 2021-04-21 DIAGNOSIS — Z7983 Long term (current) use of bisphosphonates: Secondary | ICD-10-CM | POA: Diagnosis not present

## 2021-04-21 DIAGNOSIS — I712 Thoracic aortic aneurysm, without rupture, unspecified: Secondary | ICD-10-CM | POA: Diagnosis not present

## 2021-04-21 DIAGNOSIS — D649 Anemia, unspecified: Secondary | ICD-10-CM | POA: Diagnosis not present

## 2021-04-21 DIAGNOSIS — Z79899 Other long term (current) drug therapy: Secondary | ICD-10-CM | POA: Diagnosis not present

## 2021-04-21 DIAGNOSIS — Z905 Acquired absence of kidney: Secondary | ICD-10-CM | POA: Diagnosis not present

## 2021-04-21 DIAGNOSIS — I10 Essential (primary) hypertension: Secondary | ICD-10-CM | POA: Diagnosis not present

## 2021-04-21 DIAGNOSIS — R042 Hemoptysis: Secondary | ICD-10-CM | POA: Diagnosis not present

## 2021-04-21 DIAGNOSIS — Z923 Personal history of irradiation: Secondary | ICD-10-CM | POA: Diagnosis not present

## 2021-04-21 DIAGNOSIS — C642 Malignant neoplasm of left kidney, except renal pelvis: Secondary | ICD-10-CM | POA: Diagnosis not present

## 2021-04-21 DIAGNOSIS — G4700 Insomnia, unspecified: Secondary | ICD-10-CM | POA: Diagnosis not present

## 2021-04-21 MED ORDER — DENOSUMAB 120 MG/1.7ML ~~LOC~~ SOLN
120.0000 mg | Freq: Once | SUBCUTANEOUS | Status: AC
  Administered 2021-04-21: 120 mg via SUBCUTANEOUS
  Filled 2021-04-21: qty 1.7

## 2021-04-21 NOTE — Patient Instructions (Signed)
Denosumab injection What is this medication? DENOSUMAB (den oh sue mab) slows bone breakdown. Prolia is used to treat osteoporosis in women after menopause and in men, and in people who are taking corticosteroids for 6 months or more. Xgeva is used to treat a high calcium level due to cancer and to prevent bone fractures and other bone problems caused by multiple myeloma or cancer bone metastases. Xgeva is also used to treat giant cell tumor of the bone. This medicine may be used for other purposes; ask your health care provider or pharmacist if you have questions. COMMON BRAND NAME(S): Prolia, XGEVA What should I tell my care team before I take this medication? They need to know if you have any of these conditions: dental disease having surgery or tooth extraction infection kidney disease low levels of calcium or Vitamin D in the blood malnutrition on hemodialysis skin conditions or sensitivity thyroid or parathyroid disease an unusual reaction to denosumab, other medicines, foods, dyes, or preservatives pregnant or trying to get pregnant breast-feeding How should I use this medication? This medicine is for injection under the skin. It is given by a health care professional in a hospital or clinic setting. A special MedGuide will be given to you before each treatment. Be sure to read this information carefully each time. For Prolia, talk to your pediatrician regarding the use of this medicine in children. Special care may be needed. For Xgeva, talk to your pediatrician regarding the use of this medicine in children. While this drug may be prescribed for children as young as 13 years for selected conditions, precautions do apply. Overdosage: If you think you have taken too much of this medicine contact a poison control center or emergency room at once. NOTE: This medicine is only for you. Do not share this medicine with others. What if I miss a dose? It is important not to miss your dose.  Call your doctor or health care professional if you are unable to keep an appointment. What may interact with this medication? Do not take this medicine with any of the following medications: other medicines containing denosumab This medicine may also interact with the following medications: medicines that lower your chance of fighting infection steroid medicines like prednisone or cortisone This list may not describe all possible interactions. Give your health care provider a list of all the medicines, herbs, non-prescription drugs, or dietary supplements you use. Also tell them if you smoke, drink alcohol, or use illegal drugs. Some items may interact with your medicine. What should I watch for while using this medication? Visit your doctor or health care professional for regular checks on your progress. Your doctor or health care professional may order blood tests and other tests to see how you are doing. Call your doctor or health care professional for advice if you get a fever, chills or sore throat, or other symptoms of a cold or flu. Do not treat yourself. This drug may decrease your body's ability to fight infection. Try to avoid being around people who are sick. You should make sure you get enough calcium and vitamin D while you are taking this medicine, unless your doctor tells you not to. Discuss the foods you eat and the vitamins you take with your health care professional. See your dentist regularly. Brush and floss your teeth as directed. Before you have any dental work done, tell your dentist you are receiving this medicine. Do not become pregnant while taking this medicine or for 5 months after   stopping it. Talk with your doctor or health care professional about your birth control options while taking this medicine. Women should inform their doctor if they wish to become pregnant or think they might be pregnant. There is a potential for serious side effects to an unborn child. Talk to  your health care professional or pharmacist for more information. What side effects may I notice from receiving this medication? Side effects that you should report to your doctor or health care professional as soon as possible: allergic reactions like skin rash, itching or hives, swelling of the face, lips, or tongue bone pain breathing problems dizziness jaw pain, especially after dental work redness, blistering, peeling of the skin signs and symptoms of infection like fever or chills; cough; sore throat; pain or trouble passing urine signs of low calcium like fast heartbeat, muscle cramps or muscle pain; pain, tingling, numbness in the hands or feet; seizures unusual bleeding or bruising unusually weak or tired Side effects that usually do not require medical attention (report to your doctor or health care professional if they continue or are bothersome): constipation diarrhea headache joint pain loss of appetite muscle pain runny nose tiredness upset stomach This list may not describe all possible side effects. Call your doctor for medical advice about side effects. You may report side effects to FDA at 1-800-FDA-1088. Where should I keep my medication? This medicine is only given in a clinic, doctor's office, or other health care setting and will not be stored at home. NOTE: This sheet is a summary. It may not cover all possible information. If you have questions about this medicine, talk to your doctor, pharmacist, or health care provider.  2022 Elsevier/Gold Standard (2017-09-23 00:00:00)

## 2021-04-27 ENCOUNTER — Encounter: Payer: Self-pay | Admitting: Oncology

## 2021-04-28 ENCOUNTER — Other Ambulatory Visit: Payer: Self-pay

## 2021-04-28 ENCOUNTER — Telehealth: Payer: Self-pay

## 2021-04-28 NOTE — Telephone Encounter (Addendum)
Pt called, states he went to ED on Sunday because he was hurting in his chest. They did scans, EKG and prescribed new meds. He wanted to make sure we knew and could get the records.

## 2021-04-29 ENCOUNTER — Other Ambulatory Visit: Payer: Self-pay

## 2021-04-29 ENCOUNTER — Encounter: Payer: Self-pay | Admitting: Oncology

## 2021-04-30 ENCOUNTER — Other Ambulatory Visit: Payer: Self-pay | Admitting: Oncology

## 2021-04-30 DIAGNOSIS — M109 Gout, unspecified: Secondary | ICD-10-CM

## 2021-05-12 NOTE — Progress Notes (Signed)
Patient called me and left a message stating that his Medicaid was no longer active. He wanted me to help him find other insurance. I called patient back to speak with him and had to leave a message asking him to return my call.

## 2021-05-14 ENCOUNTER — Other Ambulatory Visit: Payer: Self-pay | Admitting: Oncology

## 2021-05-14 DIAGNOSIS — M109 Gout, unspecified: Secondary | ICD-10-CM

## 2021-05-15 NOTE — Assessment & Plan Note (Addendum)
He was on palliative everolimus 10 mg daily, but was placed on hold due to hemoptysis.  The lung metastases were stable on CTA chest in October.  Bronchoscopy in November with Dr. Alcide Clever revealed obstructed right lower lobes by mucus plug with tumor.  Biopsies confirmed metastatic carcinoma, consistent with primary renal cell carcinoma. He was referred for palliative radiation, but the hemoptysis resolved, so radiation was not recommended.  CTA chest done on November 27 in the emergency room evaluate for chest pain revealed progressive disease with a dominant mass in the right lower lobe measuring 43 x 22 mm, previously 35 x 25 mm and a dominant right upper lobe mass measuring 19 mm previously 17 mm.  A 14 mm right supraclavicular node was also seen.  I recommend resuming everolimus 10 mg daily.  He understands this is the last line of therapy available to him. He will let us know if he does not have everolimus at home. We will plan to see him back in 4 weeks with a CBC and comprehensive metabolic panel.

## 2021-05-15 NOTE — Assessment & Plan Note (Addendum)
His pain is better controlled since right shoulder surgery, although he still requires regular use of hydrocodone/APAP. The denosumab had been on hold, mainly because he had generalized pain after this. He resumed denosumab on November 22nd and states he has had generalized bone pain since receiving this. As his calcium is normal, we will hold denosumab this month.

## 2021-05-15 NOTE — Assessment & Plan Note (Addendum)
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 Due to his high risk of recurrence, we recommended adjuvant oral chemotherapy with sunitinib 50 mg daily for 2 weeks on and 1 week off for total of 1 year. 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 He was evaluated in the emergency department and placed on clonidine 0.2 mg 3 times daily. The hypertension was uncontrolled despite multiple medications. Sunitinib was therefore discontinued after only 2 weeks of therapy. He has been through multiple other therapies, but did not tolerate them. Everolimus has been on hold since he developed hemoptysis in October.  He will resume everolimus at this time due to progressive lung metastasis.

## 2021-05-15 NOTE — Progress Notes (Addendum)
[name]  [address] [place],  [place]  [address] [phone]  Clinic Day:  05/18/21  Referring physician: [name], NP  ASSESSMENT & PLAN:   Assessment & Plan: Lung metastases [facility]) He was on palliative everolimus 10 mg daily, but was placed on hold due to hemoptysis.  The lung metastases were stable on CTA chest in October.  Bronchoscopy in November with Dr. Alcide Peck revealed obstructed right lower lobes by mucus plug with tumor.  Biopsies confirmed metastatic carcinoma, consistent with primary renal cell carcinoma. He was referred for palliative radiation, but the hemoptysis resolved, so radiation was not recommended.  CTA chest done on November 27 in the emergency room evaluate for chest pain revealed progressive disease with a dominant mass in the right lower lobe measuring 43 x 22 mm, previously 35 x 25 mm and a dominant right upper lobe mass measuring 19 mm previously 17 mm.  A 14 mm right supraclavicular node was also seen.  I recommend resuming everolimus 10 mg daily.  He understands this is the last line of therapy available to him. He will let us know if he does not have everolimus at home. We will plan to see him back in 4 weeks with a CBC and comprehensive metabolic panel.  Bone metastasis ([place]) His pain is better controlled since right shoulder surgery, although he still requires regular use of hydrocodone/APAP. The denosumab had been on hold, mainly because he had generalized pain after this. He resumed denosumab on November 22nd and states he has had generalized bone pain since receiving this. As his calcium is normal, we will hold denosumab this month.  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  Due to his high risk of recurrence, we recommended adjuvant oral chemotherapy with sunitinib 50 mg daily for 2  weeks on and 1 week off for total of 1 year. 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  He was evaluated in the emergency department and placed on clonidine 0.2 mg 3 times daily.  The hypertension was uncontrolled despite multiple medications. Sunitinib was therefore discontinued after only 2 weeks of therapy. He has been through multiple other therapies, but did not tolerate them. Everolimus has been on hold since he developed hemoptysis in October.  He will resume everolimus at this time due to progressive lung metastasis.  Hypercalcemia Resolved with denosumab in November. We will continue to monitor this.  Iron deficiency anemia due to chronic blood loss His hemoglobin has improved, but he has recurrent microcytosis. Iron studies from today are consistent with anemia of chronic disease.  Weight loss His appetite is decreased and he is losing weight, so I recommended placing him on mirtazapine 15 mg at bedtime for his appetite.   The patient understands the plans discussed today and is in agreement with them.  He knows to contact our office if he develops concerns prior to his next appointment.      Steven Pickles, PA-C  [facility] AT [facility] [address] [place] [address] Dept: [phone] Dept Fax: [phone]   Orders Placed This Encounter  Procedures   CBC and differential    This external order was created through the Results Console.   CBC    This external order was created through the Results  Console.   CBC    This order was created through External Result Entry   Basic metabolic panel    This external order was created through the Results Console.   Comprehensive metabolic panel    This external order was created through the Results Console.   Hepatic function panel    This external order was created through the Results Console.      CHIEF  COMPLAINT:  CC: Metastatic kidney cancer to lung and bone  Current Treatment: Denosumab every 4 weeks   HISTORY OF PRESENT ILLNESS:   Oncology History  Malignant neoplasm of left kidney, except renal pelvis ([place])  11/18/2018 Cancer Staging   Staging form: Kidney, AJCC 8th Edition - Clinical stage from 11/18/2018: Stage III (cT3a, cN0, cM0) - Signed by Steven Kaplan, MD on 01/18/2021 Histopathologic type: Clear cell adenocarcinoma, NOS Stage prefix: Initial diagnosis Histologic grade (G): G3 Histologic grading system: 4 grade system Laterality: Left Tumor size (mm): 95 Lymph-vascular invasion (LVI): Venous (large vessel) invasion only (V) Diagnostic confirmation: Positive histology Specimen type: Excision Staged by: Managing physician Histologic sub-type: Clear cell Sarcomatoid features: Absent Rhabdoid features: Absent Tumor necrosis: Absent Invasion beyond capsule into fat or perisinus tissues: Absent Venous invasion (V): V2 - Macroscopic venous invasion Venous involvement of the kidney: Renal vein Adrenal extension: Negative Stage used in treatment planning: Yes National guidelines used in treatment planning: Yes Type of national guideline used in treatment planning: NCCN Staging comments: Radical nephrectomy.  Attempted adjuvant sunitinib but unable to tolerate    02/17/2020 Initial Diagnosis   Malignant neoplasm of left kidney, except renal pelvis ([facility])      INTERVAL HISTORY:  Steven Peck is here today for repeat clinical assessment.  He resumed denosumab last month when he had mild hypercalcemia. He reports generalized bone pain since receiving denosumab in November.  He also reports swelling and numbness of his feet.  The swelling does not worsen throughout the day.  When he saw Dr. Orlene Peck for possible palliative radiation to the lung, his hemoptysis had resolved, so radiation was not recommended.  He denies recurrent hemoptysis.  He was seen in the emergency room on  November 27th to evaluate chest pain.  Cardiac evaluation was negative.  CTA chest did not reveal any evidence of pulmonary embolism.  There was evidence of progressive disease with an increase in the dominant masses in the right lower lobe and right upper lobe.  He was discharged on azithromycin, sucralfate and famotidine.  He denies continued chest pain.   He denies fevers or chills. He states his right shoulder pain persists.  He rates it 4 out of 10 today.  He has some relief with hydrocodone/APAP. His appetite is decreased. His weight has decreased 5 pounds over last 4 weeks .  REVIEW OF SYSTEMS:  Review of Systems  Constitutional:  Negative for appetite change, chills, fatigue, fever and unexpected weight change.  HENT:   Negative for lump/mass, mouth sores and sore throat.   Respiratory:  Negative for cough and shortness of breath.   Cardiovascular:  Positive for leg swelling (Swelling of the bilateral feet). Negative for chest pain.  Gastrointestinal:  Negative for abdominal pain, constipation, diarrhea, nausea and vomiting.  Genitourinary:  Negative for difficulty urinating, dysuria, frequency and hematuria.   Musculoskeletal:  Positive for arthralgias (Mainly right shoulder, but generalized for the past month). Negative for back pain and myalgias.  Skin:  Negative for itching, rash and wound.  Neurological:  Positive for numbness (Numbness of  bilateral feet). Negative for dizziness, extremity weakness, headaches and light-headedness.  Hematological:  Negative for adenopathy.  Psychiatric/Behavioral:  Negative for depression and sleep disturbance. The patient is not nervous/anxious.     VITALS:  Blood pressure 127/71, pulse (!) 111, temperature 97.6 F (36.4 C), temperature source Oral, resp. rate 18, height $RemoveBe'5\' 10"'ExBlVIYcN$  (1.778 m), weight 217 lb (98.4 kg), SpO2 100 %.  Wt Readings from Last 3 Encounters:  05/18/21 217 lb (98.4 kg)  04/21/21 216 lb 8 oz (98.2 kg)  04/20/21 217 lb 14.4 oz  (98.8 kg)    Body mass index is 31.14 kg/m.  Performance status (ECOG): 2 - Symptomatic, <50% confined to bed  PHYSICAL EXAM:  Physical Exam Vitals and nursing note reviewed.  Constitutional:      General: He is not in acute distress.    Appearance: Normal appearance. He is normal weight.  HENT:     Head: Normocephalic and atraumatic.     Mouth/Throat:     Mouth: Mucous membranes are moist.     Pharynx: Oropharynx is clear. No oropharyngeal exudate or posterior oropharyngeal erythema.  Eyes:     General: No scleral icterus.    Extraocular Movements: Extraocular movements intact.     Conjunctiva/sclera: Conjunctivae normal.     Pupils: Pupils are equal, round, and reactive to light.  Cardiovascular:     Rate and Rhythm: Normal rate and regular rhythm.     Heart sounds: Normal heart sounds. No murmur heard.   No friction rub. No gallop.  Pulmonary:     Effort: Pulmonary effort is normal.     Breath sounds: Normal breath sounds. No wheezing, rhonchi or rales.  Abdominal:     General: Bowel sounds are normal. There is no distension.     Palpations: Abdomen is soft. There is no hepatomegaly, splenomegaly or mass.     Tenderness: There is no abdominal tenderness.  Musculoskeletal:        General: Normal range of motion.     Cervical back: Normal range of motion and neck supple. No tenderness.     Right lower leg: Edema (Trace pedal edema) present.     Left lower leg: Edema (Trace pedal edema) present.  Lymphadenopathy:     Cervical: No cervical adenopathy.     Upper Body:     Right upper body: No supraclavicular or axillary adenopathy.     Left upper body: No supraclavicular or axillary adenopathy.     Lower Body: No right inguinal adenopathy. No left inguinal adenopathy.  Skin:    General: Skin is warm and dry.     Coloration: Skin is not jaundiced.     Findings: No rash.  Neurological:     Mental Status: He is alert and oriented to person, place, and time.     Cranial  Nerves: No cranial nerve deficit.  Psychiatric:        Mood and Affect: Mood normal.        Behavior: Behavior normal.        Thought Content: Thought content normal.    LABS:   CBC Latest Ref Rng & Units 05/18/2021 04/20/2021 03/18/2021  WBC - 5.2 4.8 4.3  Hemoglobin 13.5 - 17.5 11.0(A) 10.8(A) 10.6(A)  Hematocrit 41 - 53 35(A) 35(A) 35(A)  Platelets 150 - 399 382 300 278   CMP Latest Ref Rng & Units 05/18/2021 04/20/2021 03/18/2021  Glucose 70 - 99 mg/dL - - -  BUN 4 - $R'21 11 15 14  'Hx$ Creatinine  0.6 - 1.3 0.9 1.1 1.1  Sodium 137 - 147 135(A) 137 139  Potassium 3.4 - 5.3 4.7 3.9 3.9  Chloride 99 - 108 103 101 103  CO2 13 - 22 21 26(A) 28(A)  Calcium 8.7 - 10.7 8.5(A) 11.4(A) 9.7  Alkaline Phos 25 - 125 101 75 108  AST 14 - 40 $Re'17 20 21  'Rra$ ALT 10 - 40 $Re'14 14 13     'vTf$ No results found for: CEA1 / No results found for: CEA1 No results found for: PSA1 No results found for: WOE321 No results found for: CAN125  No results found for: TOTALPROTELP, ALBUMINELP, A1GS, A2GS, BETS, BETA2SER, GAMS, MSPIKE, SPEI Lab Results  Component Value Date   TIBC 190 (L) 05/18/2021   TIBC 224 (L) 10/20/2020   TIBC 234 07/29/2020   FERRITIN 1,836 (H) 05/18/2021   FERRITIN 1,807 (H) 10/20/2020   FERRITIN 681 07/29/2020   IRONPCTSAT 14 (L) 05/18/2021   IRONPCTSAT 8 (L) 10/20/2020   IRONPCTSAT TNP 07/29/2020   Lab Results  Component Value Date   LDH 302 (A) 07/29/2020    STUDIES:  No results found.    HISTORY:   Past Medical History:  Diagnosis Date   Anemia    Asthma    as a child   Cancer ([place])    Left renal cancer   Constipation    Hemoptysis 03/18/2021   Hypertension    Malignant neoplasm of left kidney, except renal pelvis ([place])    Weight loss 05/18/2021    Past Surgical History:  Procedure Laterality Date   CYSTOSCOPY N/A 01/10/2019   Procedure: CYSTOSCOPY;  Surgeon: Alexis Frock, MD;  Location: [facility];  Service: Urology;  Laterality: N/A;   EXCISION / CURETTAGE TUMOR  HUMERUS Right 12/26/2020   ROBOT ASSISTED LAPAROSCOPIC NEPHRECTOMY Left 01/10/2019   Procedure: XI ROBOTIC ASSISTED LAPAROSCOPIC NEPHRECTOMY;  Surgeon: Alexis Frock, MD;  Location: [facility];  Service: Urology;  Laterality: Left;  3 HRS    History reviewed. No pertinent family history.  Social History:  reports that he has never smoked. He has never used smokeless tobacco. He reports that he does not currently use alcohol. He reports that he does not use drugs.The patient is alone today.  Allergies:  Allergies  Allergen Reactions   Lisinopril Swelling    Angioedema of lips Angioedema of lips Angioedema of lips Angioedema of lips   Tramadol     Other reaction(s): Other (See Comments) Other reaction(s): Other (see comments) Severe headache   Hydrochlorothiazide Swelling    Current Medications: Current Outpatient Medications  Medication Sig Dispense Refill   HYDROcodone-acetaminophen (NORCO/VICODIN) 5-325 MG tablet Take 1-2 tablets by mouth every 6 (six) hours as needed for moderate pain.     mirtazapine (REMERON) 15 MG tablet Take 1 tablet (15 mg total) by mouth at bedtime. 30 tablet 1   AFINITOR 10 MG tablet TAKE 1 TABLET DAILY (Patient not taking: Reported on 03/18/2021) 30 tablet 5   benzonatate (TESSALON) 200 MG capsule Take 200 mg by mouth 3 (three) times daily as needed.     calcium citrate-vitamin D (CITRACAL+D) 315-200 MG-UNIT tablet Take 1 tablet by mouth 2 (two) times daily.     colchicine 0.6 MG tablet TAKE 1 TABLET BY MOUTH TWICE A DAY 180 tablet 1   magic mouthwash w/lidocaine SOLN Take 5 mLs by mouth every 3 (three) hours as needed for mouth pain. Or throat pain; swish and spit 240 mL 3   Multiple Vitamins-Minerals (MULTIVITAMIN WITH MINERALS) tablet  Take 1 tablet by mouth daily.     NARCAN 4 MG/0.1ML LIQD nasal spray kit SMARTSIG:1 Both Nares Daily PRN     olmesartan (BENICAR) 20 MG tablet Take 1 tablet (20 mg total) by mouth daily. 30 tablet 2   Omega-3 1000 MG CAPS  Take by mouth.     ondansetron (ZOFRAN) 4 MG tablet Take 4 mg by mouth every 8 (eight) hours as needed for nausea or vomiting. (Patient not taking: Reported on 03/18/2021)     pantoprazole (PROTONIX) 40 MG tablet Take 1 tablet (40 mg total) by mouth daily. 30 tablet 2   prochlorperazine (COMPAZINE) 10 MG tablet Take 10 mg by mouth every 6 (six) hours as needed for nausea or vomiting. (Patient not taking: Reported on 03/18/2021)     sucralfate (CARAFATE) 1 g tablet Take 1 g by mouth 3 (three) times daily.     SYMBICORT 80-4.5 MCG/ACT inhaler SMARTSIG:2 Inhalation Via Inhaler Twice Daily     No current facility-administered medications for this visit.

## 2021-05-18 ENCOUNTER — Encounter: Payer: Self-pay | Admitting: Hematology and Oncology

## 2021-05-18 ENCOUNTER — Inpatient Hospital Stay: Payer: Medicaid Other | Attending: Oncology | Admitting: Hematology and Oncology

## 2021-05-18 ENCOUNTER — Inpatient Hospital Stay: Payer: Medicaid Other

## 2021-05-18 ENCOUNTER — Telehealth: Payer: Self-pay | Admitting: Hematology and Oncology

## 2021-05-18 DIAGNOSIS — I10 Essential (primary) hypertension: Secondary | ICD-10-CM | POA: Diagnosis not present

## 2021-05-18 DIAGNOSIS — R634 Abnormal weight loss: Secondary | ICD-10-CM

## 2021-05-18 DIAGNOSIS — C7801 Secondary malignant neoplasm of right lung: Secondary | ICD-10-CM

## 2021-05-18 DIAGNOSIS — C7802 Secondary malignant neoplasm of left lung: Secondary | ICD-10-CM

## 2021-05-18 DIAGNOSIS — C642 Malignant neoplasm of left kidney, except renal pelvis: Secondary | ICD-10-CM | POA: Insufficient documentation

## 2021-05-18 DIAGNOSIS — Z79899 Other long term (current) drug therapy: Secondary | ICD-10-CM | POA: Insufficient documentation

## 2021-05-18 DIAGNOSIS — D509 Iron deficiency anemia, unspecified: Secondary | ICD-10-CM | POA: Insufficient documentation

## 2021-05-18 DIAGNOSIS — C7951 Secondary malignant neoplasm of bone: Secondary | ICD-10-CM | POA: Diagnosis not present

## 2021-05-18 DIAGNOSIS — D500 Iron deficiency anemia secondary to blood loss (chronic): Secondary | ICD-10-CM

## 2021-05-18 HISTORY — DX: Abnormal weight loss: R63.4

## 2021-05-18 LAB — CBC AND DIFFERENTIAL
HCT: 35 — AB (ref 41–53)
Hemoglobin: 11 — AB (ref 13.5–17.5)
Neutrophils Absolute: 3.22
Platelets: 382 (ref 150–399)
WBC: 5.2

## 2021-05-18 LAB — HEPATIC FUNCTION PANEL
ALT: 14 (ref 10–40)
AST: 17 (ref 14–40)
Alkaline Phosphatase: 101 (ref 25–125)
Bilirubin, Total: 0.5

## 2021-05-18 LAB — BASIC METABOLIC PANEL
BUN: 11 (ref 4–21)
CO2: 21 (ref 13–22)
Chloride: 103 (ref 99–108)
Creatinine: 0.9 (ref 0.6–1.3)
Glucose: 106
Potassium: 4.7 (ref 3.4–5.3)
Sodium: 135 — AB (ref 137–147)

## 2021-05-18 LAB — CBC
MCV: 79 — AB (ref 80–94)
RBC: 4.46 (ref 3.87–5.11)

## 2021-05-18 LAB — IRON AND TIBC
Iron: 27 ug/dL — ABNORMAL LOW (ref 45–182)
Saturation Ratios: 14 % — ABNORMAL LOW (ref 17.9–39.5)
TIBC: 190 ug/dL — ABNORMAL LOW (ref 250–450)
UIBC: 163 ug/dL

## 2021-05-18 LAB — FERRITIN: Ferritin: 1836 ng/mL — ABNORMAL HIGH (ref 24–336)

## 2021-05-18 LAB — COMPREHENSIVE METABOLIC PANEL
Albumin: 3.9 (ref 3.5–5.0)
Calcium: 8.5 — AB (ref 8.7–10.7)

## 2021-05-18 MED ORDER — MIRTAZAPINE 15 MG PO TABS: 15.0000 mg | ORAL_TABLET | Freq: Every day | ORAL | 1 refills | Status: DC

## 2021-05-18 NOTE — Assessment & Plan Note (Signed)
Resolved with denosumab in November. We will continue to monitor this.

## 2021-05-18 NOTE — Assessment & Plan Note (Signed)
His appetite is decreased and he is losing weight, so I recommended placing him on mirtazapine 15 mg at bedtime for his appetite.

## 2021-05-18 NOTE — Assessment & Plan Note (Addendum)
His hemoglobin has improved, but he has recurrent microcytosis. Iron studies from today are consistent with anemia of chronic disease.

## 2021-05-18 NOTE — Telephone Encounter (Signed)
Per 12/16 los next appt scheduled and given to patient °

## 2021-05-19 ENCOUNTER — Other Ambulatory Visit: Payer: Self-pay

## 2021-05-19 ENCOUNTER — Other Ambulatory Visit: Payer: Self-pay | Admitting: Hematology and Oncology

## 2021-05-19 ENCOUNTER — Encounter: Payer: Self-pay | Admitting: Oncology

## 2021-05-19 DIAGNOSIS — C7951 Secondary malignant neoplasm of bone: Secondary | ICD-10-CM

## 2021-05-19 DIAGNOSIS — M25511 Pain in right shoulder: Secondary | ICD-10-CM

## 2021-05-19 MED ORDER — MIRTAZAPINE 15 MG PO TABS: 15.0000 mg | ORAL_TABLET | Freq: Every day | ORAL | 1 refills | Status: DC

## 2021-05-19 MED ORDER — HYDROCODONE-ACETAMINOPHEN 5-325 MG PO TABS
1.0000 | ORAL_TABLET | Freq: Four times a day (QID) | ORAL | 0 refills | Status: DC | PRN
Start: 2021-05-19 — End: 2021-06-15

## 2021-05-26 ENCOUNTER — Other Ambulatory Visit (HOSPITAL_COMMUNITY): Payer: Self-pay

## 2021-05-26 ENCOUNTER — Other Ambulatory Visit: Payer: Self-pay

## 2021-05-26 ENCOUNTER — Encounter: Payer: Self-pay | Admitting: Oncology

## 2021-05-26 DIAGNOSIS — C642 Malignant neoplasm of left kidney, except renal pelvis: Secondary | ICD-10-CM

## 2021-05-26 MED ORDER — AFINITOR 10 MG PO TABS
10.0000 mg | ORAL_TABLET | Freq: Every day | ORAL | 5 refills | Status: DC
  Filled 2021-05-26: qty 56, 56d supply, fill #0
  Filled 2021-05-27: qty 28, 28d supply, fill #0
  Filled 2021-06-25 – 2021-07-21 (×2): qty 28, 28d supply, fill #1

## 2021-05-26 NOTE — Progress Notes (Signed)
Called and spoke with patient, he stated that he believes his Medicaid will be inactive starting 05/31/21. I suggested calling his case worker to find out if that is true. He stated that he was not interested in getting any information regarding commercial insurance at this time. He is going to call me back and let me know what he finds out regarding his insurance, if its inactive and he does not get any other insurance, we will need to apply for free Xgeva. If I do not hear from him by the first of January I will reach back out to him.

## 2021-05-27 ENCOUNTER — Other Ambulatory Visit (HOSPITAL_COMMUNITY): Payer: Self-pay

## 2021-05-28 ENCOUNTER — Other Ambulatory Visit (HOSPITAL_COMMUNITY): Payer: Self-pay

## 2021-06-05 ENCOUNTER — Other Ambulatory Visit: Payer: Self-pay | Admitting: Pharmacist

## 2021-06-15 ENCOUNTER — Other Ambulatory Visit: Payer: Self-pay

## 2021-06-15 ENCOUNTER — Inpatient Hospital Stay: Payer: Medicaid Other

## 2021-06-15 ENCOUNTER — Inpatient Hospital Stay: Payer: Medicaid Other | Attending: Oncology | Admitting: Hematology and Oncology

## 2021-06-15 ENCOUNTER — Encounter: Payer: Self-pay | Admitting: Hematology and Oncology

## 2021-06-15 DIAGNOSIS — C642 Malignant neoplasm of left kidney, except renal pelvis: Secondary | ICD-10-CM

## 2021-06-15 DIAGNOSIS — C7802 Secondary malignant neoplasm of left lung: Secondary | ICD-10-CM

## 2021-06-15 DIAGNOSIS — R634 Abnormal weight loss: Secondary | ICD-10-CM | POA: Diagnosis not present

## 2021-06-15 DIAGNOSIS — C7801 Secondary malignant neoplasm of right lung: Secondary | ICD-10-CM | POA: Diagnosis not present

## 2021-06-15 DIAGNOSIS — C7951 Secondary malignant neoplasm of bone: Secondary | ICD-10-CM

## 2021-06-15 LAB — HEPATIC FUNCTION PANEL
ALT: 21 (ref 10–40)
AST: 24 (ref 14–40)
Alkaline Phosphatase: 120 (ref 25–125)
Bilirubin, Total: 0.5

## 2021-06-15 LAB — BASIC METABOLIC PANEL
BUN: 14 (ref 4–21)
CO2: 22 (ref 13–22)
Chloride: 107 (ref 99–108)
Creatinine: 0.9 (ref 0.6–1.3)
Glucose: 110
Potassium: 4.4 (ref 3.4–5.3)
Sodium: 136 — AB (ref 137–147)

## 2021-06-15 LAB — CBC AND DIFFERENTIAL
HCT: 35 — AB (ref 41–53)
Hemoglobin: 11.2 — AB (ref 13.5–17.5)
Neutrophils Absolute: 2.27
Platelets: 340 (ref 150–399)
WBC: 4.2

## 2021-06-15 LAB — COMPREHENSIVE METABOLIC PANEL
Albumin: 3.9 (ref 3.5–5.0)
Calcium: 8.7 (ref 8.7–10.7)

## 2021-06-15 LAB — CBC: RBC: 4.61 (ref 3.87–5.11)

## 2021-06-15 MED ORDER — PANTOPRAZOLE SODIUM 40 MG PO TBEC: 40.0000 mg | DELAYED_RELEASE_TABLET | Freq: Every day | ORAL | 2 refills | Status: DC

## 2021-06-15 MED ORDER — MIRTAZAPINE 30 MG PO TABS: 30.0000 mg | ORAL_TABLET | Freq: Every day | ORAL | 3 refills | Status: DC

## 2021-06-15 MED ORDER — OXYCODONE HCL 5 MG PO TABS: 5.0000 mg | ORAL_TABLET | ORAL | 0 refills | Status: DC | PRN

## 2021-06-15 NOTE — Assessment & Plan Note (Addendum)
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 Due to his high risk of recurrence, we recommended adjuvant oral chemotherapy with sunitinib 50 mg daily for 2 weeks on and 1 week off for total of 1 year. 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 He was evaluated in the emergency department and placed on clonidine 0.2 mg 3 times daily. The hypertension was uncontrolled despite multiple medications. Sunitinib was therefore discontinued after only 2 weeks of therapy. He has been through multiple other therapies, but did not tolerate them. Everolimus was on hold since he developed hemoptysis in October; he resumed last month. He is tolerating well and will continue everolimus at this time due to progressive lung metastasis. He will return to clinic in 4 weeks for repeat evaluation.

## 2021-06-15 NOTE — Assessment & Plan Note (Addendum)
He was on palliative everolimus 10 mg daily, but was placed on hold due to hemoptysis.  The lung metastases were stable on CTA chest in October.  Bronchoscopy in November with Dr. Alcide Clever revealed obstructed right lower lobes by mucus plug with tumor.  Biopsies confirmed metastatic carcinoma, consistent with primary renal cell carcinoma. He was referred for palliative radiation, but the hemoptysis resolved, so radiation was not recommended.  CTA chest done on November 27 in the emergency room evaluate for chest pain revealed progressive disease with a dominant mass in the right lower lobe measuring 43 x 22 mm, previously 35 x 25 mm and a dominant right upper lobe mass measuring 19 mm previously 17 mm.  A 14 mm right supraclavicular node was also seen. He resumed everolimus and is tolerating well.

## 2021-06-15 NOTE — Assessment & Plan Note (Addendum)
His pain is better controlled since right shoulder surgery, although he still has breakthrough pain and would like to go back to oxycodone as the hydrocodone is not as effective anymor. The denosumab had been on hold, mainly because he had generalized pain after this. He resumed denosumab on November 22nd and states he has had generalized bone pain since receiving this. As his calcium is normal, we will hold denosumab this month.

## 2021-06-15 NOTE — Progress Notes (Signed)
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  Due to his high risk of recurrence, we recommended adjuvant oral chemotherapy with sunitinib 50 mg daily for 2 weeks on and 1 week off for total of 1 year. 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  He was evaluated in the emergency department and placed on clonidine 0.2 mg 3 times daily.  The hypertension was uncontrolled despite multiple medications. Sunitinib was therefore discontinued after only 2 weeks of therapy. He has been through multiple other therapies, but did not tolerate them. Everolimus was on hold since he developed hemoptysis in October; he resumed last month. He is tolerating well and will continue everolimus at this time due to progressive lung metastasis. He will return to clinic in 4 weeks for repeat evaluation.  Bone metastasis ([place]) His pain is better controlled since right shoulder surgery, although he still has breakthrough pain and would like to go back to oxycodone as the hydrocodone is not as effective anymor. The denosumab had been on hold, mainly because he had generalized pain after this. He resumed denosumab on November 22nd and states he has had generalized bone pain since receiving this. As his calcium is normal, we will hold denosumab this month.  Lung metastases ([place]) He was on palliative everolimus 10 mg daily, but was placed on hold due to hemoptysis.  The lung metastases were stable on CTA chest in October.  Bronchoscopy in  November with Dr. Alcide Clever revealed obstructed right lower lobes by mucus plug with tumor.  Biopsies confirmed metastatic carcinoma, consistent with primary renal cell carcinoma. He was referred for palliative radiation, but the hemoptysis resolved, so radiation was not recommended.  CTA chest done on November 27 in the emergency room evaluate for chest pain revealed progressive disease with a dominant mass in the right lower lobe measuring 43 x 22 mm, previously 35 x 25 mm and a dominant right upper lobe mass measuring 19 mm previously 17 mm.  A 14 mm right supraclavicular node was also seen. He resumed everolimus and is tolerating well.  Weight loss His appetite remains low despite Remeron 15 mg daily. We will increase this to 30 mg nightly.    The patient understands the plans discussed today and is in agreement with them.  He knows to contact our office if he develops concerns prior to his next appointment.    Melodye Ped, NP  [place] [address] [place] [address] Dept: [phone] Dept Fax: [phone]   Orders Placed This Encounter  Procedures   CBC and differential    This external order was created through the Results Console.   CBC    This external order was created through the Results Console.   Basic metabolic panel    This external order was created through the Results Console.   Basic metabolic panel    This external order was created through the Results Console.   Comprehensive metabolic panel    This external order was created through the  Results Console.   Hepatic function panel    This external order was created through the Results Console.      CHIEF COMPLAINT:  CC: A 56 year old male with history of renal cell carcinoma here for 4 week evaluation  Current Treatment:  Everolimus  INTERVAL HISTORY:  Leotis is here today for repeat clinical assessment. He denies fevers or chills.  He denies pain. His appetite is good. His weight has decreased 8 pounds over last 4 weeks .  I have reviewed the past medical history, past surgical history, social history and family history with the patient and they are unchanged from previous note.  ALLERGIES:  is allergic to lisinopril, tramadol, and hydrochlorothiazide.  MEDICATIONS:  Current Outpatient Medications  Medication Sig Dispense Refill   mirtazapine (REMERON) 30 MG tablet Take 1 tablet (30 mg total) by mouth at bedtime. 30 tablet 3   oxyCODONE (OXY IR/ROXICODONE) 5 MG immediate release tablet Take 1-2 tablets (5-10 mg total) by mouth every 4 (four) hours as needed for severe pain. 120 tablet 0   AFINITOR 10 MG tablet Take 1 tablet (10 mg total) by mouth daily. 30 tablet 5   benzonatate (TESSALON) 200 MG capsule Take 200 mg by mouth 3 (three) times daily as needed.     calcium citrate-vitamin D (CITRACAL+D) 315-200 MG-UNIT tablet Take 1 tablet by mouth 2 (two) times daily.     colchicine 0.6 MG tablet TAKE 1 TABLET BY MOUTH TWICE A DAY 180 tablet 1   magic mouthwash w/lidocaine SOLN Take 5 mLs by mouth every 3 (three) hours as needed for mouth pain. Or throat pain; swish and spit 240 mL 3   Multiple Vitamins-Minerals (MULTIVITAMIN WITH MINERALS) tablet Take 1 tablet by mouth daily.     NARCAN 4 MG/0.1ML LIQD nasal spray kit SMARTSIG:1 Both Nares Daily PRN     olmesartan (BENICAR) 20 MG tablet Take 1 tablet (20 mg total) by mouth daily. 30 tablet 2   Omega-3 1000 MG CAPS Take by mouth.     ondansetron (ZOFRAN) 4 MG tablet Take 4 mg by mouth every 8 (eight) hours as needed for nausea or vomiting. (Patient not taking: Reported on 03/18/2021)     pantoprazole (PROTONIX) 40 MG tablet Take 1 tablet (40 mg total) by mouth daily. 30 tablet 2   prochlorperazine (COMPAZINE) 10 MG tablet Take 10 mg by mouth every 6 (six) hours as needed for nausea or vomiting. (Patient not taking: Reported on 03/18/2021)     sucralfate (CARAFATE) 1 g  tablet Take 1 g by mouth 3 (three) times daily.     SYMBICORT 80-4.5 MCG/ACT inhaler SMARTSIG:2 Inhalation Via Inhaler Twice Daily     No current facility-administered medications for this visit.    HISTORY OF PRESENT ILLNESS:   Oncology History  Malignant neoplasm of left kidney, except renal pelvis ([place])  11/18/2018 Cancer Staging   Staging form: Kidney, AJCC 8th Edition - Clinical stage from 11/18/2018: Stage III (cT3a, cN0, cM0) - Signed by Derwood Kaplan, MD on 01/18/2021 Histopathologic type: Clear cell adenocarcinoma, NOS Stage prefix: Initial diagnosis Histologic grade (G): G3 Histologic grading system: 4 grade system Laterality: Left Tumor size (mm): 95 Lymph-vascular invasion (LVI): Venous (large vessel) invasion only (V) Diagnostic confirmation: Positive histology Specimen type: Excision Staged by: Managing physician Histologic sub-type: Clear cell Sarcomatoid features: Absent Rhabdoid features: Absent Tumor necrosis: Absent Invasion beyond capsule into fat or perisinus tissues: Absent Venous invasion (V): V2 - Macroscopic venous invasion Venous involvement of the  kidney: Renal vein Adrenal extension: Negative Stage used in treatment planning: Yes National guidelines used in treatment planning: Yes Type of national guideline used in treatment planning: NCCN Staging comments: Radical nephrectomy.  Attempted adjuvant sunitinib but unable to tolerate    02/17/2020 Initial Diagnosis   Malignant neoplasm of left kidney, except renal pelvis ([facility])       REVIEW OF SYSTEMS:   Constitutional: Denies fevers, chills or abnormal weight loss Eyes: Denies blurriness of vision Ears, nose, mouth, throat, and face: Denies mucositis or sore throat Respiratory: Denies cough, dyspnea or wheezes Cardiovascular: Denies palpitation, chest discomfort or lower extremity swelling Gastrointestinal:  Denies nausea, heartburn or change in bowel habits Skin: Denies abnormal skin  rashes Lymphatics: Denies new lymphadenopathy or easy bruising Neurological:Denies numbness, tingling or new weaknesses Behavioral/Psych: Mood is stable, no new changes  All other systems were reviewed with the patient and are negative.   VITALS:  Blood pressure (!) 141/87, pulse (!) 113, temperature 98.2 F (36.8 C), temperature source Oral, resp. rate 18, height $RemoveBe'5\' 10"'XvkPrgHZg$  (1.778 m), weight 209 lb 14.4 oz (95.2 kg), SpO2 100 %.  Wt Readings from Last 3 Encounters:  06/15/21 209 lb 14.4 oz (95.2 kg)  05/18/21 217 lb (98.4 kg)  04/21/21 216 lb 8 oz (98.2 kg)    Body mass index is 30.12 kg/m.  Performance status (ECOG): 1 - Symptomatic but completely ambulatory  PHYSICAL EXAM:   GENERAL:alert, no distress and comfortable SKIN: skin color, texture, turgor are normal, no rashes or significant lesions EYES: normal, Conjunctiva are pink and non-injected, sclera clear OROPHARYNX:no exudate, no erythema and lips, buccal mucosa, and tongue normal  NECK: supple, thyroid normal size, non-tender, without nodularity LYMPH:  no palpable lymphadenopathy in the cervical, axillary or inguinal LUNGS: clear to auscultation and percussion with normal breathing effort HEART: regular rate & rhythm and no murmurs and no lower extremity edema ABDOMEN:abdomen soft, non-tender and normal bowel sounds Musculoskeletal:no cyanosis of digits and no clubbing  NEURO: alert & oriented x 3 with fluent speech, no focal motor/sensory deficits  LABORATORY DATA:  I have reviewed the data as listed    Component Value Date/Time   NA 136 (A) 06/15/2021 0000   K 4.4 06/15/2021 0000   CL 107 06/15/2021 0000   CO2 22 06/15/2021 0000   GLUCOSE 120 (H) 01/11/2019 0508   BUN 14 06/15/2021 0000   CREATININE 0.9 06/15/2021 0000   CREATININE 1.17 01/11/2019 0508   CALCIUM 8.7 06/15/2021 0000   ALBUMIN 3.9 06/15/2021 0000   AST 24 06/15/2021 0000   ALT 21 06/15/2021 0000   ALKPHOS 120 06/15/2021 0000   GFRNONAA >60  01/11/2019 0508   GFRAA >60 01/11/2019 0508    No results found for: SPEP, UPEP  Lab Results  Component Value Date   WBC 4.2 06/15/2021   NEUTROABS 2.27 06/15/2021   HGB 11.2 (A) 06/15/2021   HCT 35 (A) 06/15/2021   MCV 79 (A) 05/18/2021   PLT 340 06/15/2021      Chemistry      Component Value Date/Time   NA 136 (A) 06/15/2021 0000   K 4.4 06/15/2021 0000   CL 107 06/15/2021 0000   CO2 22 06/15/2021 0000   BUN 14 06/15/2021 0000   CREATININE 0.9 06/15/2021 0000   CREATININE 1.17 01/11/2019 0508   GLU 110 06/15/2021 0000      Component Value Date/Time   CALCIUM 8.7 06/15/2021 0000   ALKPHOS 120 06/15/2021 0000   AST 24 06/15/2021  0000   ALT 21 06/15/2021 0000       RADIOGRAPHIC STUDIES: I have personally reviewed the radiological images as listed and agreed with the findings in the report. No results found.

## 2021-06-15 NOTE — Assessment & Plan Note (Signed)
His appetite remains low despite Remeron 15 mg daily. We will increase this to 30 mg nightly.

## 2021-06-15 NOTE — Addendum Note (Signed)
Addended byGeorgette Shell on: 06/15/2021 01:27 PM   Modules accepted: Orders

## 2021-06-16 ENCOUNTER — Ambulatory Visit: Payer: Medicaid Other

## 2021-06-17 ENCOUNTER — Telehealth: Payer: Self-pay

## 2021-06-17 NOTE — Telephone Encounter (Addendum)
06/18/21 Steven Peck - Pt notified of below. He states he feels better today than yesterday.  06/17/21 Dr Hinton Rao @ 1719-  let's have him hold the Afinitor until he is over the worst.  06/17/21 [name],RN - Pt called. He is @ Urgent Care, as has been sick few days. Flu test negative. COVID test neg. They diagnosed him with bronchitis and sinus infection. He received antibiotic injection, prednisone pack and an inhaler. T 101. He is still on Afinitor. However, didn't take it yesterday as he felt bad. he didn't realized he had fever today until he go to Urgent Care.  I sent the above message to Dr Hinton Rao and [facility] @ [phone].

## 2021-06-18 ENCOUNTER — Telehealth: Payer: Self-pay | Admitting: Dietician

## 2021-06-18 ENCOUNTER — Encounter: Payer: Self-pay | Admitting: Oncology

## 2021-06-18 NOTE — Telephone Encounter (Signed)
Nutrition Assessment   Reason for Assessment: MST screen for weight loss.   Patient is 56 YO man with Malignant neoplasm of left kidney, except renal pelvis ([place]). 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 He has mets to bone and both lungs. He is followed by Dr. Hinton Rao. PMHx includes  HTN, Hypercalcemia, Vit D deficiency.  He reports usually skips breakfast and lunch hasn't had any appetite lately. Dinner he usually goes out [employer], [employer], some times [employer]). Reports he tries to get some healthy stuff, broccoli, beans, greens) after dinner snacking on ham sandwiches with mayo. Has been avoiding cheese, was drinking milk and having cereal with milk daily but stopped with hypercalcemia.  Fluids  Body armour, Pedialyte Orange juice (not tasting good anymore too sweet), lots of water (1/2 big jug)    Medications: Remeron increase prescribed hasn't started taking it yet. He plans to start tonight. No longer taking MVI but reports he'll start back up.  Labs: 06/15/21 Ca+ wnl, Hgb 11.2 improving    Anthropometrics: Weight loss 7.1#, 3% past month  Height: 70": Weight: 06/15/21 209.9# UBW: 213-220 BMI: 30.12   INTERVENTION: Encouraged more meals more frequently. Eating within 1-2 hours of waking and no more than 3-4 hours between feeds. Reviewed goals for healthy eating and encouraged more whole grains and plant based choices. Will mail eating tips for poor appetite. Offered to schedule future nutrition consult he denied need at this time feels he has good information to help. Provided contact information if he changes mind.   MONITORING, EVALUATION, GOAL: weight trends, PO intake and labs  Next Visit: at patient or provider request or MST screen  April Manson, RDN, LDN Registered Dietitian, [place] Part Time Remote (Usual office hours: Tuesday-Thursday) Cell: [phone]

## 2021-06-22 ENCOUNTER — Other Ambulatory Visit: Payer: Self-pay | Admitting: Pharmacist

## 2021-06-22 ENCOUNTER — Other Ambulatory Visit (HOSPITAL_COMMUNITY): Payer: Self-pay

## 2021-06-25 ENCOUNTER — Other Ambulatory Visit (HOSPITAL_COMMUNITY): Payer: Self-pay

## 2021-06-29 ENCOUNTER — Other Ambulatory Visit (HOSPITAL_COMMUNITY): Payer: Self-pay

## 2021-07-08 ENCOUNTER — Other Ambulatory Visit: Payer: Self-pay | Admitting: Hematology and Oncology

## 2021-07-08 DIAGNOSIS — R64 Cachexia: Secondary | ICD-10-CM

## 2021-07-10 ENCOUNTER — Other Ambulatory Visit: Payer: Self-pay

## 2021-07-13 ENCOUNTER — Encounter: Payer: Self-pay | Admitting: Hematology and Oncology

## 2021-07-13 ENCOUNTER — Inpatient Hospital Stay: Payer: Medicaid Other | Attending: Oncology | Admitting: Hematology and Oncology

## 2021-07-13 ENCOUNTER — Other Ambulatory Visit: Payer: Self-pay

## 2021-07-13 ENCOUNTER — Inpatient Hospital Stay: Payer: Medicaid Other

## 2021-07-13 VITALS — BP 115/83 | HR 138 | Temp 98.3°F | Resp 20 | Ht 70.0 in | Wt 212.0 lb

## 2021-07-13 DIAGNOSIS — Z905 Acquired absence of kidney: Secondary | ICD-10-CM | POA: Diagnosis not present

## 2021-07-13 DIAGNOSIS — C7951 Secondary malignant neoplasm of bone: Secondary | ICD-10-CM

## 2021-07-13 DIAGNOSIS — D509 Iron deficiency anemia, unspecified: Secondary | ICD-10-CM | POA: Diagnosis not present

## 2021-07-13 DIAGNOSIS — C7802 Secondary malignant neoplasm of left lung: Secondary | ICD-10-CM

## 2021-07-13 DIAGNOSIS — I10 Essential (primary) hypertension: Secondary | ICD-10-CM | POA: Diagnosis not present

## 2021-07-13 DIAGNOSIS — C642 Malignant neoplasm of left kidney, except renal pelvis: Secondary | ICD-10-CM

## 2021-07-13 DIAGNOSIS — D500 Iron deficiency anemia secondary to blood loss (chronic): Secondary | ICD-10-CM | POA: Diagnosis not present

## 2021-07-13 DIAGNOSIS — M25462 Effusion, left knee: Secondary | ICD-10-CM

## 2021-07-13 DIAGNOSIS — C7800 Secondary malignant neoplasm of unspecified lung: Secondary | ICD-10-CM | POA: Insufficient documentation

## 2021-07-13 DIAGNOSIS — M2540 Effusion, unspecified joint: Secondary | ICD-10-CM | POA: Insufficient documentation

## 2021-07-13 DIAGNOSIS — Z79899 Other long term (current) drug therapy: Secondary | ICD-10-CM | POA: Diagnosis not present

## 2021-07-13 LAB — BASIC METABOLIC PANEL
BUN: 11 (ref 4–21)
CO2: 27 — AB (ref 13–22)
Chloride: 103 (ref 99–108)
Creatinine: 1 (ref 0.6–1.3)
Glucose: 114
Potassium: 4.8 (ref 3.4–5.3)
Sodium: 137 (ref 137–147)

## 2021-07-13 LAB — CBC: RBC: 4.58 (ref 3.87–5.11)

## 2021-07-13 LAB — CBC AND DIFFERENTIAL
HCT: 34 — AB (ref 41–53)
Hemoglobin: 11 — AB (ref 13.5–17.5)
Neutrophils Absolute: 4.03
Platelets: 488 — AB (ref 150–399)
WBC: 6.6

## 2021-07-13 LAB — IRON AND TIBC
Iron: 22 ug/dL — ABNORMAL LOW (ref 45–182)
Saturation Ratios: 10 % — ABNORMAL LOW (ref 17.9–39.5)
TIBC: 211 ug/dL — ABNORMAL LOW (ref 250–450)
UIBC: 189 ug/dL

## 2021-07-13 LAB — COMPREHENSIVE METABOLIC PANEL
Albumin: 3.9 (ref 3.5–5.0)
Calcium: 9.3 (ref 8.7–10.7)

## 2021-07-13 LAB — HEPATIC FUNCTION PANEL
ALT: 18 (ref 10–40)
AST: 22 (ref 14–40)
Alkaline Phosphatase: 120 (ref 25–125)
Bilirubin, Total: 0.5

## 2021-07-13 LAB — FERRITIN: Ferritin: 2101 ng/mL — ABNORMAL HIGH (ref 24–336)

## 2021-07-13 NOTE — Progress Notes (Signed)
Patient Care Team: [name], NP as PCP - General (Family Medicine) Derwood Kaplan, MD as PCP - Hematology/Oncology (Oncology) Magda Bernheim, MD as Referring Physician (Orthopedic Surgery)  Clinic Day:  07/13/2021  Referring physician: [name], NP  ASSESSMENT & PLAN:   Assessment & Plan: Malignant neoplasm of left kidney, except renal pelvis [facility]) History of stage III renal cell carcinoma diagnosed in June 2020 treated with a left nephrectomy in August 2020.  Due to his high risk of recurrence, we recommended adjuvant oral chemotherapy with sunitinib 50 mg daily for 2 weeks on and 1 week off for total of 1 year. 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  He was evaluated in the emergency department and placed on clonidine 0.2 mg 3 times daily.  The hypertension was uncontrolled despite multiple medications. Sunitinib was therefore discontinued after only 2 weeks of therapy. He has been through multiple other therapies, but did not tolerate them. Everolimus was on hold since he developed hemoptysis in October; he resumed last month. He is tolerating well and will continue everolimus at this time due to progressive lung metastasis. He will return to clinic in 4 weeks for repeat evaluation.  Iron deficiency anemia due to chronic blood loss His hemoglobin has improved, but he has recurrent microcytosis. I will repeat iron studies today as he states feeling very tired and elevated heart rate.  Joint effusion Increased fluid and pain noted to left knee and right elbow. He has had fluid drained from his knee in past. I will refer him to [facility] for evaluation.    The patient understands the plans discussed today and is in agreement with them.  He knows to contact our office if he develops concerns prior to his next appointment.     Melodye Ped, NP  [facility] [address] [place] [address] Dept: [phone] Dept Fax: [phone]   Orders Placed This Encounter  Procedures   Iron and TIBC    Standing Status:   Future    Number of Occurrences:   1    Standing Expiration Date:   07/13/2022   Ferritin    Standing Status:   Future    Number of Occurrences:   1    Standing Expiration Date:   07/13/2022      CHIEF COMPLAINT:  CC: A 56 year old male with history of renal cell carcinoma  Current Treatment:  Everolimus  INTERVAL HISTORY:  Steven Peck is here today for repeat clinical assessment. He denies fevers or chills. He denies pain. His appetite is good. His weight has been stable.  I have reviewed the past medical history, past surgical history, social history and family history with the patient and they are unchanged from previous note.  ALLERGIES:  is allergic to lisinopril, tramadol, and hydrochlorothiazide.  MEDICATIONS:  Current Outpatient Medications  Medication Sig Dispense Refill   mirtazapine (REMERON) 30 MG tablet TAKE 1 TABLET BY MOUTH AT BEDTIME. 90 tablet 2   Pseudoeph-Doxylamine-DM-APAP (NYQUIL PO) Take by mouth every evening.     AFINITOR 10 MG tablet Take 1 tablet (10 mg total) by mouth daily. 30 tablet 5   calcium citrate-vitamin D (CITRACAL+D) 315-200 MG-UNIT tablet Take 1 tablet by mouth 2 (two) times daily.     colchicine 0.6 MG tablet TAKE 1 TABLET BY MOUTH TWICE A DAY 180 tablet 1   magic mouthwash w/lidocaine SOLN Take  5 mLs by mouth every 3 (three) hours as needed for mouth pain. Or throat pain; swish and spit 240 mL 3   Multiple Vitamins-Minerals (MULTIVITAMIN WITH MINERALS) tablet Take 1 tablet by mouth daily.     NARCAN 4 MG/0.1ML LIQD nasal spray kit SMARTSIG:1 Both Nares Daily PRN     olmesartan (BENICAR) 20 MG tablet Take 1 tablet (20 mg total) by mouth daily. 30 tablet 2   Omega-3 1000 MG CAPS Take by mouth.     ondansetron (ZOFRAN) 4 MG tablet Take 4 mg by mouth every 8  (eight) hours as needed for nausea or vomiting. (Patient not taking: Reported on 03/18/2021)     oxyCODONE (OXY IR/ROXICODONE) 5 MG immediate release tablet Take 1-2 tablets (5-10 mg total) by mouth every 4 (four) hours as needed for severe pain. 120 tablet 0   pantoprazole (PROTONIX) 40 MG tablet Take 1 tablet (40 mg total) by mouth daily. 30 tablet 2   prochlorperazine (COMPAZINE) 10 MG tablet Take 10 mg by mouth every 6 (six) hours as needed for nausea or vomiting. (Patient not taking: Reported on 03/18/2021)     sucralfate (CARAFATE) 1 g tablet Take 1 g by mouth 3 (three) times daily.     VENTOLIN HFA 108 (90 Base) MCG/ACT inhaler SMARTSIG:2 Puff(s) Via Inhaler 4 Times Daily PRN     No current facility-administered medications for this visit.    HISTORY OF PRESENT ILLNESS:   Oncology History  Malignant neoplasm of left kidney, except renal pelvis ([place])  11/18/2018 Cancer Staging   Staging form: Kidney, AJCC 8th Edition - Clinical stage from 11/18/2018: Stage III (cT3a, cN0, cM0) - Signed by Derwood Kaplan, MD on 01/18/2021 Histopathologic type: Clear cell adenocarcinoma, NOS Stage prefix: Initial diagnosis Histologic grade (G): G3 Histologic grading system: 4 grade system Laterality: Left Tumor size (mm): 95 Lymph-vascular invasion (LVI): Venous (large vessel) invasion only (V) Diagnostic confirmation: Positive histology Specimen type: Excision Staged by: Managing physician Histologic sub-type: Clear cell Sarcomatoid features: Absent Rhabdoid features: Absent Tumor necrosis: Absent Invasion beyond capsule into fat or perisinus tissues: Absent Venous invasion (V): V2 - Macroscopic venous invasion Venous involvement of the kidney: Renal vein Adrenal extension: Negative Stage used in treatment planning: Yes National guidelines used in treatment planning: Yes Type of national guideline used in treatment planning: NCCN Staging comments: Radical nephrectomy.  Attempted adjuvant  sunitinib but unable to tolerate    02/17/2020 Initial Diagnosis   Malignant neoplasm of left kidney, except renal pelvis ([facility])       REVIEW OF SYSTEMS:   Constitutional: Denies fevers, chills or abnormal weight loss Eyes: Denies blurriness of vision Ears, nose, mouth, throat, and face: Denies mucositis or sore throat Respiratory: Denies cough, dyspnea or wheezes Cardiovascular: Denies palpitation, chest discomfort or lower extremity swelling Gastrointestinal:  Denies nausea, heartburn or change in bowel habits Skin: Denies abnormal skin rashes Lymphatics: Denies new lymphadenopathy or easy bruising Neurological:Denies numbness, tingling or new weaknesses Behavioral/Psych: Mood is stable, no new changes  All other systems were reviewed with the patient and are negative.   VITALS:  Blood pressure 115/83, pulse (!) 138, temperature 98.3 F (36.8 C), temperature source Oral, resp. rate 20, height $RemoveBe'5\' 10"'jbsYVCQen$  (1.778 m), weight 212 lb (96.2 kg), SpO2 97 %.  Wt Readings from Last 3 Encounters:  07/13/21 212 lb (96.2 kg)  06/15/21 209 lb 14.4 oz (95.2 kg)  05/18/21 217 lb (98.4 kg)    Body mass index is 30.42 kg/m.  Performance status (  ECOG): 1 - Symptomatic but completely ambulatory  PHYSICAL EXAM:   GENERAL:alert, no distress and comfortable SKIN: skin color, texture, turgor are normal, no rashes or significant lesions EYES: normal, Conjunctiva are pink and non-injected, sclera clear OROPHARYNX:no exudate, no erythema and lips, buccal mucosa, and tongue normal  NECK: supple, thyroid normal size, non-tender, without nodularity LYMPH:  no palpable lymphadenopathy in the cervical, axillary or inguinal LUNGS: clear to auscultation and percussion with normal breathing effort HEART: regular rate & rhythm and no murmurs and no lower extremity edema ABDOMEN:abdomen soft, non-tender and normal bowel sounds Musculoskeletal:no cyanosis of digits and no clubbing  NEURO: alert & oriented x 3  with fluent speech, no focal motor/sensory deficits  LABORATORY DATA:  I have reviewed the data as listed    Component Value Date/Time   NA 137 07/13/2021 0000   K 4.8 07/13/2021 0000   CL 103 07/13/2021 0000   CO2 27 (A) 07/13/2021 0000   GLUCOSE 120 (H) 01/11/2019 0508   BUN 11 07/13/2021 0000   CREATININE 1.0 07/13/2021 0000   CREATININE 1.17 01/11/2019 0508   CALCIUM 9.3 07/13/2021 0000   ALBUMIN 3.9 07/13/2021 0000   AST 22 07/13/2021 0000   ALT 18 07/13/2021 0000   ALKPHOS 120 07/13/2021 0000   GFRNONAA >60 01/11/2019 0508   GFRAA >60 01/11/2019 0508    No results found for: SPEP, UPEP  Lab Results  Component Value Date   WBC 6.6 07/13/2021   NEUTROABS 4.03 07/13/2021   HGB 11.0 (A) 07/13/2021   HCT 34 (A) 07/13/2021   MCV 79 (A) 05/18/2021   PLT 488 (A) 07/13/2021      Chemistry      Component Value Date/Time   NA 137 07/13/2021 0000   K 4.8 07/13/2021 0000   CL 103 07/13/2021 0000   CO2 27 (A) 07/13/2021 0000   BUN 11 07/13/2021 0000   CREATININE 1.0 07/13/2021 0000   CREATININE 1.17 01/11/2019 0508   GLU 114 07/13/2021 0000      Component Value Date/Time   CALCIUM 9.3 07/13/2021 0000   ALKPHOS 120 07/13/2021 0000   AST 22 07/13/2021 0000   ALT 18 07/13/2021 0000       RADIOGRAPHIC STUDIES: I have personally reviewed the radiological images as listed and agreed with the findings in the report. No results found.

## 2021-07-13 NOTE — Assessment & Plan Note (Signed)
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 Due to his high risk of recurrence, we recommended adjuvant oral chemotherapy with sunitinib 50 mg daily for 2 weeks on and 1 week off for total of 1 year. 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 He was evaluated in the emergency department and placed on clonidine 0.2 mg 3 times daily. The hypertension was uncontrolled despite multiple medications. Sunitinib was therefore discontinued after only 2 weeks of therapy. He has been through multiple other therapies, but did not tolerate them. Everolimus was on hold since he developed hemoptysis in October; he resumed last month. He is tolerating well and will continue everolimus at this time due to progressive lung metastasis. He will return to clinic in 4 weeks for repeat evaluation.

## 2021-07-13 NOTE — Assessment & Plan Note (Signed)
Increased fluid and pain noted to left knee and right elbow. He has had fluid drained from his knee in past. I will refer him to [facility] for evaluation.

## 2021-07-13 NOTE — Assessment & Plan Note (Signed)
His hemoglobin has improved, but he has recurrent microcytosis. I will repeat iron studies today as he states feeling very tired and elevated heart rate.

## 2021-07-15 ENCOUNTER — Other Ambulatory Visit (HOSPITAL_COMMUNITY): Payer: Self-pay

## 2021-07-21 ENCOUNTER — Other Ambulatory Visit (HOSPITAL_COMMUNITY): Payer: Self-pay

## 2021-07-21 ENCOUNTER — Other Ambulatory Visit: Payer: Self-pay | Admitting: Hematology and Oncology

## 2021-07-21 MED ORDER — EVEROLIMUS 10 MG PO TABS
10.0000 mg | ORAL_TABLET | Freq: Every day | ORAL | 5 refills | Status: AC
  Filled 2021-07-21: qty 28, 28d supply, fill #0
  Filled 2021-09-04: qty 28, 28d supply, fill #1

## 2021-07-22 ENCOUNTER — Other Ambulatory Visit (HOSPITAL_COMMUNITY): Payer: Self-pay

## 2021-08-11 ENCOUNTER — Other Ambulatory Visit (HOSPITAL_COMMUNITY): Payer: Self-pay

## 2021-08-13 ENCOUNTER — Inpatient Hospital Stay: Payer: Medicaid Other

## 2021-08-13 ENCOUNTER — Inpatient Hospital Stay: Payer: Medicaid Other | Attending: Oncology | Admitting: Oncology

## 2021-08-13 ENCOUNTER — Other Ambulatory Visit (HOSPITAL_COMMUNITY): Payer: Self-pay

## 2021-08-13 ENCOUNTER — Other Ambulatory Visit: Payer: Self-pay | Admitting: Oncology

## 2021-08-13 ENCOUNTER — Encounter: Payer: Self-pay | Admitting: Oncology

## 2021-08-13 ENCOUNTER — Other Ambulatory Visit: Payer: Self-pay

## 2021-08-13 VITALS — BP 144/93 | HR 124 | Temp 96.2°F | Resp 16 | Ht 69.0 in | Wt 214.0 lb

## 2021-08-13 DIAGNOSIS — D500 Iron deficiency anemia secondary to blood loss (chronic): Secondary | ICD-10-CM | POA: Diagnosis not present

## 2021-08-13 DIAGNOSIS — D509 Iron deficiency anemia, unspecified: Secondary | ICD-10-CM | POA: Diagnosis present

## 2021-08-13 DIAGNOSIS — C7951 Secondary malignant neoplasm of bone: Secondary | ICD-10-CM | POA: Diagnosis not present

## 2021-08-13 DIAGNOSIS — C7801 Secondary malignant neoplasm of right lung: Secondary | ICD-10-CM

## 2021-08-13 DIAGNOSIS — C642 Malignant neoplasm of left kidney, except renal pelvis: Secondary | ICD-10-CM

## 2021-08-13 DIAGNOSIS — C7802 Secondary malignant neoplasm of left lung: Secondary | ICD-10-CM

## 2021-08-13 LAB — BASIC METABOLIC PANEL
BUN: 12 (ref 4–21)
CO2: 25 — AB (ref 13–22)
Chloride: 105 (ref 99–108)
Creatinine: 1 (ref 0.6–1.3)
Glucose: 91
Potassium: 3.8 mEq/L (ref 3.5–5.1)
Sodium: 140 (ref 137–147)

## 2021-08-13 LAB — HEPATIC FUNCTION PANEL
ALT: 16 U/L (ref 10–40)
AST: 18 (ref 14–40)
Alkaline Phosphatase: 92 (ref 25–125)
Bilirubin, Total: 0.6

## 2021-08-13 LAB — CBC AND DIFFERENTIAL
HCT: 31 — AB (ref 41–53)
Hemoglobin: 9.3 — AB (ref 13.5–17.5)
Neutrophils Absolute: 2.96
Platelets: 363 10*3/uL (ref 150–400)
WBC: 5.2

## 2021-08-13 LAB — FERRITIN: Ferritin: 1837 ng/mL — ABNORMAL HIGH (ref 24–336)

## 2021-08-13 LAB — IRON AND TIBC
Iron: 54 ug/dL (ref 45–182)
Saturation Ratios: 14 % — ABNORMAL LOW (ref 17.9–39.5)
TIBC: 395 ug/dL (ref 250–450)
UIBC: 341 ug/dL

## 2021-08-13 LAB — COMPREHENSIVE METABOLIC PANEL
Albumin: 3.9 (ref 3.5–5.0)
Calcium: 9.9 (ref 8.7–10.7)

## 2021-08-13 LAB — CBC: RBC: 4.24 (ref 3.87–5.11)

## 2021-08-13 NOTE — Progress Notes (Signed)
Pt in for follow up, reports having pain in right shoulder and knees.  Pt reports is working on establishing a PCP.  ?

## 2021-08-15 LAB — SOLUBLE TRANSFERRIN RECEPTOR: Transferrin Receptor: 18.6 nmol/L (ref 12.2–27.3)

## 2021-08-24 ENCOUNTER — Encounter: Payer: Self-pay | Admitting: Oncology

## 2021-08-24 NOTE — Progress Notes (Signed)
?[facility]  ?[address] ?[place],  [place]  [address] ?[phone] ? ?Clinic Day:  08/13/21 ? ? ?ASSESSMENT & PLAN:  ? ?Assessment & Plan: ?Lung metastases ([place]) ?He was on palliative everolimus 10 mg daily, but was placed on hold due to hemoptysis.  The lung metastases were stable on CTA chest in October.  Bronchoscopy in November with Dr. Alcide Clever revealed obstructed right lower lobes by mucus plug with tumor.  Biopsies confirmed metastatic carcinoma, consistent with primary renal cell carcinoma. He was referred for palliative radiation, but the hemoptysis resolved, so radiation was not recommended.  CTA chest done on November 27 in the emergency room evaluate for chest pain revealed progressive disease with a dominant mass in the right lower lobe measuring 43 x 22 mm, previously 35 x 25 mm and a dominant right upper lobe mass measuring 19 mm previously 17 mm.  A 14 mm right supraclavicular node was also seen.  I recommend resuming everolimus 10 mg daily, and he seems to be tolerating it well.  We will be due for restaging scans by next month. ? ?Bone metastasis ([place]) ?His pain is better controlled since right shoulder surgery, although he still requires regular use of hydrocodone/APAP. The denosumab had been on hold, mainly because he had generalized pain after this. He resumed denosumab on November 22nd and states he has had generalized bone pain since receiving this. As his calcium is normal, we will hold denosumab unless needed. ?  ?Malignant neoplasm of left kidney, except renal pelvis ([place]) ?History of stage III renal cell carcinoma diagnosed in June 2020 treated with a left nephrectomy in August 2020.  Due to his high risk of recurrence, we recommended adjuvant oral chemotherapy with sunitinib 50 mg daily for 2 weeks on and 1 week off for total of 1 year. 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  He was  evaluated in the emergency department and placed on clonidine 0.2 mg 3 times daily.  The hypertension was uncontrolled despite multiple medications. Sunitinib was therefore discontinued after only 2 weeks of therapy. He has been through multiple other therapies, but did not tolerate them. Everolimus was been on hold after he developed hemoptysis in October, but resumed at the end of December due to progressive lung metastasis. ?  ?Hypercalcemia ?Resolved with denosumab in November. We will continue to monitor this and hold off on further denosumab unless needed. ?  ?Iron deficiency anemia due to chronic blood loss ?His hemoglobin has worsened considerably, with worsening recurrent microcytosis. Iron studies from today are equivocal with an iron level of 22 but a TIBC of 211.  His iron saturation is low at 10%.  His ferritin is over 2000, but I feel this is an acute phase reactant from his cancer.  I have added a soluble transferrin receptor, but most likely he does need IV iron. ?  ?Weight loss ?His appetite is decreased and he was losing weight, so we had increased the mirtazapine to 30 mg at bedtime for his appetite.  He has gained 2 pounds back since last month. ? ?I will check a transferrin, but most likely he does need IV iron again with the significant drop in his hemoglobin.  He will continue the everolimus and I will see him back in 1 to 2 months with CBC, comprehensive metabolic profile, and CT scans of chest, abdomen and pelvis to assess his response. The patient understands the plans discussed  today and is in agreement with them.  He knows to contact our office if he develops concerns prior to his next appointment. ? ? ? ?Derwood Kaplan, MD  ?[facility] ?[place] ?[place] [address] ?Dept: [phone] ?Dept Fax: [phone]  ? ?Orders Placed This Encounter  ?Procedures  ? Iron and TIBC  ?  ? ? ?CHIEF COMPLAINT:  ?CC:  Metastatic kidney cancer to lung and bone ? ?Current Treatment: Everolimus 10 mg daily ? ? ?HISTORY OF PRESENT ILLNESS:  ? ?Oncology History  ?Malignant neoplasm of left kidney, except renal pelvis ([place])  ?11/18/2018 Cancer Staging  ? Staging form: Kidney, AJCC 8th Edition ?- Clinical stage from 11/18/2018: Stage III (cT3a, cN0, cM0) - Signed by Derwood Kaplan, MD on 01/18/2021 ?Histopathologic type: Clear cell adenocarcinoma, NOS ?Stage prefix: Initial diagnosis ?Histologic grade (G): G3 ?Histologic grading system: 4 grade system ?Laterality: Left ?Tumor size (mm): 95 ?Lymph-vascular invasion (LVI): Venous (large vessel) invasion only (V) ?Diagnostic confirmation: Positive histology ?Specimen type: Excision ?Staged by: Managing physician ?Histologic sub-type: Clear cell ?Sarcomatoid features: Absent ?Rhabdoid features: Absent ?Tumor necrosis: Absent ?Invasion beyond capsule into fat or perisinus tissues: Absent ?Venous invasion (V): V2 - Macroscopic venous invasion ?Venous involvement of the kidney: Renal vein ?Adrenal extension: Negative ?Stage used in treatment planning: Yes ?National guidelines used in treatment planning: Yes ?Type of national guideline used in treatment planning: NCCN ?Staging comments: Radical nephrectomy.  Attempted adjuvant sunitinib but unable to tolerate ?  ?02/17/2020 Initial Diagnosis  ? Malignant neoplasm of left kidney, except renal pelvis ([facility]) ?  ?  ? ?INTERVAL HISTORY:  ?Shaan is here today for repeat clinical assessment.  He resumed denosumab when he had mild hypercalcemia, but reports generalized bone pain since receiving denosumab in November.  We have therefore put the denosumab on hold.  He complains of feeling cold more and increased fatigue.  He continues to have arthralgias and had increasing pain of his knees.  He went to the orthopedic doctor and they performed an arthrocentesis as well as a cortisone injection, which has been somewhat helpful.  He continues to have  pain of the right shoulder that he rates as an 8 out of 10, mainly at bedtime.  He does not have a primary care provider at this time since [name] has left, but agrees to work on getting one.  He denies recurrent hemoptysis.  He was seen in the emergency room on November 27th to evaluate chest pain.  Cardiac evaluation was negative.  CTA chest did not reveal any evidence of pulmonary embolism.  He has not had a repeat scan since that time.  There was evidence of progressive disease with an increase in the dominant masses in the right lower lobe and right upper lobe.  He has been back on everolimus since the end of December.  He denies abdominal pain, bleeding, nausea or bowel problems.  He denies continued chest pain, cough or dyspnea.  He denies abdominal pain He denies fevers or chills.  He has some relief with hydrocodone/APAP, but tries to avoid taking this. His appetite is decreased by about 50%. His weight has increased 2 pounds over the last month.  His labs show that his hemoglobin has dropped from 11 to 9.3 with an MCV of 73 but normal white count and normal platelet count.  His MCV has steadily decreased from 80 in November to 75 in February and now 73.  Comprehensive  metabolic profile is normal. ? ?REVIEW OF SYSTEMS:  ?Review of Systems  ?Constitutional: Negative.  Negative for appetite change, chills, fatigue, fever and unexpected weight change.  ?HENT:  Negative.  Negative for lump/mass, mouth sores and sore throat.   ?Respiratory: Negative.  Negative for cough and shortness of breath.   ?Cardiovascular: Negative.  Negative for chest pain. Leg swelling: Swelling of the bilateral feet. ?Gastrointestinal: Negative.  Negative for abdominal pain, constipation, diarrhea, nausea and vomiting.  ?Endocrine: Negative.   ?Genitourinary: Negative.  Negative for difficulty urinating, dysuria, frequency and hematuria.   ?Musculoskeletal:  Positive for arthralgias (Mainly right shoulder, but generalized for the  past month). Negative for back pain and myalgias.  ?Skin: Negative.  Negative for itching, rash and wound.  ?Neurological: Negative.  Negative for dizziness, extremity weakness, headaches and light-headedness. Num

## 2021-08-26 ENCOUNTER — Encounter: Payer: Self-pay | Admitting: Oncology

## 2021-08-27 ENCOUNTER — Encounter: Payer: Self-pay | Admitting: Oncology

## 2021-08-27 NOTE — Addendum Note (Signed)
Addended by: Juanetta Beets on: 08/27/2021 08:56 AM ? ? Modules accepted: Orders ? ?

## 2021-08-28 ENCOUNTER — Other Ambulatory Visit: Payer: Self-pay | Admitting: Hematology and Oncology

## 2021-08-28 ENCOUNTER — Other Ambulatory Visit: Payer: Self-pay | Admitting: Pharmacist

## 2021-08-28 MED ORDER — OXYCODONE HCL 5 MG PO TABS: 5.0000 mg | ORAL_TABLET | ORAL | 0 refills | Status: AC | PRN

## 2021-08-28 MED FILL — Iron Sucrose Inj 20 MG/ML (Fe Equiv): INTRAVENOUS | Qty: 10 | Status: AC

## 2021-08-28 NOTE — Progress Notes (Signed)
Patient reports generalized pain after xgeva injections.  Consider zoledronic acid if hypercalcemia in the future. ?

## 2021-08-30 ENCOUNTER — Encounter: Payer: Self-pay | Admitting: Oncology

## 2021-08-31 ENCOUNTER — Inpatient Hospital Stay: Payer: Medicaid Other | Attending: Oncology

## 2021-08-31 VITALS — BP 149/74 | HR 116 | Temp 98.1°F | Resp 18 | Ht 69.0 in | Wt 218.0 lb

## 2021-08-31 DIAGNOSIS — D509 Iron deficiency anemia, unspecified: Secondary | ICD-10-CM | POA: Insufficient documentation

## 2021-08-31 DIAGNOSIS — C7951 Secondary malignant neoplasm of bone: Secondary | ICD-10-CM

## 2021-08-31 MED ORDER — SODIUM CHLORIDE 0.9 % IV SOLN: Freq: Once | INTRAVENOUS | Status: AC

## 2021-08-31 MED ORDER — SODIUM CHLORIDE 0.9 % IV SOLN
200.0000 mg | Freq: Once | INTRAVENOUS | Status: AC
  Administered 2021-08-31: 200 mg via INTRAVENOUS
  Filled 2021-08-31: qty 200

## 2021-08-31 NOTE — Patient Instructions (Signed)

## 2021-09-01 ENCOUNTER — Other Ambulatory Visit (HOSPITAL_COMMUNITY): Payer: Self-pay

## 2021-09-01 ENCOUNTER — Telehealth: Payer: Self-pay | Admitting: Oncology

## 2021-09-01 NOTE — Telephone Encounter (Signed)
CT C/A/P has been scheduled for 09/24/21 @ 1:30 pm ; Check in @ 12:30 pm ? ?LVM for patient to call office back to confirm this appt information. ? ?

## 2021-09-02 ENCOUNTER — Inpatient Hospital Stay: Payer: Medicaid Other

## 2021-09-02 VITALS — BP 142/78 | HR 112 | Temp 97.9°F | Resp 18 | Wt 209.5 lb

## 2021-09-02 DIAGNOSIS — D509 Iron deficiency anemia, unspecified: Secondary | ICD-10-CM | POA: Diagnosis not present

## 2021-09-02 DIAGNOSIS — C7951 Secondary malignant neoplasm of bone: Secondary | ICD-10-CM

## 2021-09-02 MED ORDER — SODIUM CHLORIDE 0.9% FLUSH: 10.0000 mL | Freq: Once | INTRAVENOUS | Status: DC | PRN

## 2021-09-02 MED ORDER — SODIUM CHLORIDE 0.9 % IV SOLN: Freq: Once | INTRAVENOUS | Status: AC

## 2021-09-02 MED ORDER — SODIUM CHLORIDE 0.9 % IV SOLN
200.0000 mg | Freq: Once | INTRAVENOUS | Status: AC
  Administered 2021-09-02: 200 mg via INTRAVENOUS
  Filled 2021-09-02: qty 200

## 2021-09-02 NOTE — Progress Notes (Signed)
1324:PT STABLE AT TIME OF DISCHARGE ?

## 2021-09-02 NOTE — Patient Instructions (Signed)

## 2021-09-03 ENCOUNTER — Inpatient Hospital Stay: Payer: Medicaid Other

## 2021-09-03 ENCOUNTER — Other Ambulatory Visit (HOSPITAL_COMMUNITY): Payer: Self-pay

## 2021-09-03 ENCOUNTER — Other Ambulatory Visit: Payer: Self-pay | Admitting: Pharmacist

## 2021-09-03 VITALS — BP 128/72 | HR 122 | Temp 98.5°F | Resp 18 | Wt 208.2 lb

## 2021-09-03 DIAGNOSIS — D509 Iron deficiency anemia, unspecified: Secondary | ICD-10-CM | POA: Diagnosis not present

## 2021-09-03 DIAGNOSIS — C7951 Secondary malignant neoplasm of bone: Secondary | ICD-10-CM

## 2021-09-03 MED ORDER — SODIUM CHLORIDE 0.9 % IV SOLN
200.0000 mg | Freq: Once | INTRAVENOUS | Status: AC
  Administered 2021-09-03: 200 mg via INTRAVENOUS
  Filled 2021-09-03: qty 200

## 2021-09-03 MED ORDER — SODIUM CHLORIDE 0.9 % IV SOLN: Freq: Once | INTRAVENOUS | Status: AC

## 2021-09-03 NOTE — Progress Notes (Addendum)
1140: Heartrate 138 bpm today  on admission. Reported to [facility] FNP  orders to proceed with Iron infusion.1253:PT STABLE AT TIME OF DISCHARGE ?

## 2021-09-03 NOTE — Patient Instructions (Signed)

## 2021-09-04 ENCOUNTER — Other Ambulatory Visit (HOSPITAL_COMMUNITY): Payer: Self-pay

## 2021-09-04 MED ORDER — SODIUM CHLORIDE (PF) 0.9 % IJ SOLN
INTRAMUSCULAR | Status: AC
  Filled 2021-09-04: qty 50

## 2021-09-08 ENCOUNTER — Inpatient Hospital Stay: Payer: Medicaid Other

## 2021-09-08 VITALS — BP 118/75 | HR 128 | Temp 98.2°F | Resp 20 | Ht 69.0 in | Wt 209.5 lb

## 2021-09-08 DIAGNOSIS — C7951 Secondary malignant neoplasm of bone: Secondary | ICD-10-CM

## 2021-09-08 DIAGNOSIS — D509 Iron deficiency anemia, unspecified: Secondary | ICD-10-CM | POA: Diagnosis not present

## 2021-09-08 MED ORDER — SODIUM CHLORIDE 0.9 % IV SOLN
200.0000 mg | Freq: Once | INTRAVENOUS | Status: AC
  Administered 2021-09-08: 200 mg via INTRAVENOUS
  Filled 2021-09-08: qty 200

## 2021-09-08 MED ORDER — SODIUM CHLORIDE 0.9 % IV SOLN: Freq: Once | INTRAVENOUS | Status: AC

## 2021-09-08 NOTE — Patient Instructions (Signed)
Iron Deficiency Anemia, Adult ?Iron deficiency anemia is a condition in which the concentration of red blood cells or hemoglobin in the blood is below normal because of too little iron. Hemoglobin is a substance in red blood cells that carries oxygen to the body's tissues. When the concentration of red blood cells or hemoglobin is too low, not enough oxygen reaches these tissues. ?Iron deficiency anemia is usually long-lasting, and it develops over time. It may or may not cause symptoms. It is a common type of anemia. ?What are the causes? ?This condition may be caused by: ?Not enough iron in the diet. ?Abnormal absorption in the gut. ?Increased need for iron because of pregnancy or heavy menstrual periods, for females. ?Cancers of the gastrointestinal system, such as colon cancer. ?Blood loss caused by bleeding in the intestine. This may be from a gastrointestinal condition like Crohn's disease. ?Frequent blood draws, such as from blood donation. ?What increases the risk? ?The following factors may make you more likely to develop this condition: ?Being pregnant. ?Being a teenage girl going through a growth spurt. ?What are the signs or symptoms? ?Symptoms of this condition may include: ?Pale skin, lips, and nail beds. ?Weakness, dizziness, and getting tired easily. ?Headache. ?Shortness of breath when moving or exercising. ?Cold hands and feet. ?Fast or irregular heartbeat. ?Irritability or rapid breathing. These are more common in severe anemia. ?Mild anemia may not cause any symptoms. ?How is this diagnosed? ?This condition is diagnosed based on: ?Your medical history. ?A physical exam. ?Blood tests. ?You may have additional tests to find the underlying cause of your anemia, such as: ?Testing for blood in the stool (fecal occult blood test). ?A procedure to see inside your colon and rectum (colonoscopy). ?A procedure to see inside your esophagus and stomach (endoscopy). ?A test in which cells are removed from  bone marrow (bone marrow aspiration) or fluid is removed from the bone marrow to be examined. This is rarely needed. ?How is this treated? ?This condition is treated by correcting the cause of your iron deficiency. Treatment may involve: ?Adding iron-rich foods to your diet. ?Taking iron supplements. If you are pregnant or breastfeeding, you may need to take extra iron because your normal diet usually does not provide the amount of iron that you need. ?Increasing vitamin C intake. Vitamin C helps your body absorb iron. Your health care provider may recommend that you take iron supplements along with a glass of orange juice or a vitamin C supplement. ?Medicines to make heavy menstrual flow lighter. ?Surgery. ?You may need repeat blood tests to determine whether treatment is working. If the treatment does not seem to be working, you may need more tests. ?Follow these instructions at home: ?Medicines ?Take over-the-counter and prescription medicines only as told by your health care provider. This includes iron supplements and vitamins. ?For the best iron absorption, you should take iron supplements when your stomach is empty. If you cannot tolerate them on an empty stomach, you may need to take them with food. ?Do not drink milk or take antacids at the same time as your iron supplements. Milk and antacids may interfere with iron absorption. ?Iron supplements may turn stool (feces) a darker color and it may appear black. ?If you cannot tolerate taking iron supplements by mouth, talk with your health care provider about taking them through an IV or through an injection into a muscle. ?Eating and drinking ? ?Talk with your health care provider before changing your diet. He or she may recommend  that you eat foods that contain a lot of iron, such as: ?Liver. ?Low-fat (lean) beef. ?Breads and cereals that have iron added to them (are fortified). ?Eggs. ?Dried fruit. ?Dark green, leafy vegetables. ?To help your body use the  iron from iron-rich foods, eat those foods at the same time as fresh fruits and vegetables that are high in vitamin C. Foods that are high in vitamin C include: ?Oranges. ?Peppers. ?Tomatoes. ?Mangoes. ?Drink enough fluid to keep your urine pale yellow. ?Managing constipation ?If you are taking an iron supplement, it may cause constipation. To prevent or treat constipation, you may need to: ?Take over-the-counter or prescription medicines. ?Eat foods that are high in fiber, such as beans, whole grains, and fresh fruits and vegetables. ?Limit foods that are high in fat and processed sugars, such as fried or sweet foods. ?General instructions ?Return to your normal activities as told by your health care provider. Ask your health care provider what activities are safe for you. ?Practice good hygiene. Anemia can make you more prone to illness and infection. ?Keep all follow-up visits as told by your health care provider. This is important. ?Contact a health care provider if you: ?Feel nauseous or you vomit. ?Feel weak. ?Have unexplained sweating. ?Develop symptoms of constipation, such as: ?Having fewer than three bowel movements a week. ?Straining to have a bowel movement. ?Having stools that are hard, dry, or larger than normal. ?Feeling full or bloated. ?Pain in the lower abdomen. ?Not feeling relief after having a bowel movement. ?Get help right away if you: ?Faint. If this happens, do not drive yourself to the hospital. ?Have chest pain. ?Have shortness of breath that: ?Is severe. ?Gets worse with physical activity. ?Have an irregular or rapid heartbeat. ?Become light-headed when getting up from a sitting or lying down position. ?These symptoms may represent a serious problem that is an emergency. Do not wait to see if the symptoms will go away. Get medical help right away. Call your local emergency services (911 in the [place] Do not drive yourself to the hospital. ?Summary ?Iron deficiency anemia is a condition in  which the concentration of red blood cells or hemoglobin in the blood is below normal because of too little iron. ?This condition is treated by correcting the cause of your iron deficiency. ?Take over-the-counter and prescription medicines only as told by your health care provider. This includes iron supplements and vitamins. ?To help your body use the iron from iron-rich foods, eat those foods at the same time as fresh fruits and vegetables that are high in vitamin C. ?Get help right away if you have shortness of breath that gets worse with physical activity. ?This information is not intended to replace advice given to you by your health care provider. Make sure you discuss any questions you have with your health care provider. ?Document Revised: 01/23/2019 Document Reviewed: 01/23/2019 ?Elsevier Patient Education ? [place]. ?Iron-Rich Diet ?Iron is a mineral that helps your body produce hemoglobin. Hemoglobin is a protein in red blood cells that carries oxygen to your body's tissues. Eating too little iron may cause you to feel weak and tired, and it can increase your risk of infection. Iron is naturally found in many foods, and many foods have iron added to them (are iron-fortified). ?You may need to follow an iron-rich diet if you do not have enough iron in your body due to certain medical conditions. The amount of iron that you need each day depends on your age, your  sex, and any medical conditions you have. Follow instructions from your health care provider or a dietitian about how much iron you should eat each day. ?What are tips for following this plan? ?Reading food labels ?Check food labels to see how many milligrams (mg) of iron are in each serving. ?Cooking ?Cook foods in pots and pans that are made from iron. ?Take these steps to make it easier for your body to absorb iron from certain foods: ?Soak beans overnight before cooking. ?Soak whole grains overnight and drain them before  using. ?Ferment flours before baking, such as by using yeast in bread dough. ?Meal planning ?When you eat foods that contain iron, you should eat them with foods that are high in vitamin C. These include oranges, pep

## 2021-09-08 NOTE — Progress Notes (Signed)
1342-Steven Peck phy, rph and melissa parsons, np notified that pt's pulse is running 129-140 (mostly mid 130's)-ok with both to start transfusion.   ?

## 2021-09-09 ENCOUNTER — Inpatient Hospital Stay: Payer: Medicaid Other

## 2021-09-09 VITALS — BP 115/84 | HR 123 | Temp 98.0°F | Resp 20 | Wt 210.0 lb

## 2021-09-09 DIAGNOSIS — D509 Iron deficiency anemia, unspecified: Secondary | ICD-10-CM | POA: Diagnosis not present

## 2021-09-09 DIAGNOSIS — C7951 Secondary malignant neoplasm of bone: Secondary | ICD-10-CM

## 2021-09-09 MED ORDER — SODIUM CHLORIDE 0.9 % IV SOLN
200.0000 mg | Freq: Once | INTRAVENOUS | Status: AC
  Administered 2021-09-09: 200 mg via INTRAVENOUS
  Filled 2021-09-09: qty 200

## 2021-09-09 MED ORDER — SODIUM CHLORIDE 0.9 % IV SOLN: Freq: Once | INTRAVENOUS | Status: AC

## 2021-09-11 ENCOUNTER — Inpatient Hospital Stay: Payer: Medicaid Other

## 2021-09-11 ENCOUNTER — Other Ambulatory Visit: Payer: Self-pay | Admitting: Hematology and Oncology

## 2021-09-11 ENCOUNTER — Encounter: Payer: Self-pay | Admitting: Oncology

## 2021-09-11 DIAGNOSIS — C7951 Secondary malignant neoplasm of bone: Secondary | ICD-10-CM | POA: Diagnosis not present

## 2021-09-11 DIAGNOSIS — D500 Iron deficiency anemia secondary to blood loss (chronic): Secondary | ICD-10-CM

## 2021-09-11 DIAGNOSIS — C642 Malignant neoplasm of left kidney, except renal pelvis: Secondary | ICD-10-CM | POA: Diagnosis not present

## 2021-09-11 DIAGNOSIS — C7801 Secondary malignant neoplasm of right lung: Secondary | ICD-10-CM | POA: Diagnosis not present

## 2021-09-11 DIAGNOSIS — D509 Iron deficiency anemia, unspecified: Secondary | ICD-10-CM | POA: Diagnosis not present

## 2021-09-11 DIAGNOSIS — C7802 Secondary malignant neoplasm of left lung: Secondary | ICD-10-CM

## 2021-09-11 MED ORDER — SODIUM CHLORIDE 0.9 % IV SOLN: Freq: Once | INTRAVENOUS | Status: AC

## 2021-09-11 MED ORDER — ZOLEDRONIC ACID 4 MG/100ML IV SOLN
4.0000 mg | Freq: Once | INTRAVENOUS | Status: AC
  Administered 2021-09-11: 4 mg via INTRAVENOUS
  Filled 2021-09-11: qty 100

## 2021-09-11 NOTE — Patient Instructions (Signed)

## 2021-09-11 NOTE — Progress Notes (Signed)
PER FAMILY REQUEST,AND AT MELISSA, NP'S ADVICE, PT WAS TRANSPORTED TO [facility], DUE TO ELEVATED HEART RATE OF 140-150 ? ?RELEASED TO EMS STAFF, REPORT CALLED TO ER STAFF BY LISA, RN ?

## 2021-09-14 DIAGNOSIS — C642 Malignant neoplasm of left kidney, except renal pelvis: Secondary | ICD-10-CM | POA: Diagnosis not present

## 2021-09-14 DIAGNOSIS — C7801 Secondary malignant neoplasm of right lung: Secondary | ICD-10-CM | POA: Diagnosis not present

## 2021-09-14 DIAGNOSIS — C7802 Secondary malignant neoplasm of left lung: Secondary | ICD-10-CM | POA: Diagnosis not present

## 2021-09-14 DIAGNOSIS — I517 Cardiomegaly: Secondary | ICD-10-CM

## 2021-09-14 DIAGNOSIS — C7951 Secondary malignant neoplasm of bone: Secondary | ICD-10-CM | POA: Diagnosis not present

## 2021-09-14 DIAGNOSIS — R000 Tachycardia, unspecified: Secondary | ICD-10-CM

## 2021-09-15 ENCOUNTER — Ambulatory Visit: Payer: Medicaid Other | Admitting: Hematology and Oncology

## 2021-09-15 ENCOUNTER — Ambulatory Visit: Payer: Medicaid Other | Admitting: Oncology

## 2021-09-16 ENCOUNTER — Other Ambulatory Visit: Payer: Medicaid Other

## 2021-09-16 ENCOUNTER — Ambulatory Visit: Payer: Medicaid Other | Admitting: Hematology and Oncology

## 2021-09-16 DIAGNOSIS — C7801 Secondary malignant neoplasm of right lung: Secondary | ICD-10-CM | POA: Diagnosis not present

## 2021-09-16 DIAGNOSIS — C7951 Secondary malignant neoplasm of bone: Secondary | ICD-10-CM | POA: Diagnosis not present

## 2021-09-16 DIAGNOSIS — C7802 Secondary malignant neoplasm of left lung: Secondary | ICD-10-CM | POA: Diagnosis not present

## 2021-09-16 DIAGNOSIS — C642 Malignant neoplasm of left kidney, except renal pelvis: Secondary | ICD-10-CM | POA: Diagnosis not present

## 2021-09-17 ENCOUNTER — Ambulatory Visit: Payer: Medicaid Other

## 2021-09-17 ENCOUNTER — Other Ambulatory Visit (HOSPITAL_COMMUNITY): Payer: Self-pay

## 2021-09-17 DIAGNOSIS — C7802 Secondary malignant neoplasm of left lung: Secondary | ICD-10-CM | POA: Diagnosis not present

## 2021-09-17 DIAGNOSIS — C7951 Secondary malignant neoplasm of bone: Secondary | ICD-10-CM | POA: Diagnosis not present

## 2021-09-17 DIAGNOSIS — C642 Malignant neoplasm of left kidney, except renal pelvis: Secondary | ICD-10-CM | POA: Diagnosis not present

## 2021-09-17 DIAGNOSIS — C7801 Secondary malignant neoplasm of right lung: Secondary | ICD-10-CM | POA: Diagnosis not present

## 2021-09-21 DIAGNOSIS — C642 Malignant neoplasm of left kidney, except renal pelvis: Secondary | ICD-10-CM | POA: Diagnosis not present

## 2021-09-21 DIAGNOSIS — C7801 Secondary malignant neoplasm of right lung: Secondary | ICD-10-CM | POA: Diagnosis not present

## 2021-09-21 DIAGNOSIS — C7951 Secondary malignant neoplasm of bone: Secondary | ICD-10-CM | POA: Diagnosis not present

## 2021-09-21 DIAGNOSIS — C7802 Secondary malignant neoplasm of left lung: Secondary | ICD-10-CM | POA: Diagnosis not present

## 2021-09-23 ENCOUNTER — Encounter: Payer: Self-pay | Admitting: Oncology

## 2021-09-25 ENCOUNTER — Ambulatory Visit: Payer: Medicaid Other | Admitting: Oncology

## 2021-09-30 ENCOUNTER — Other Ambulatory Visit: Payer: Medicaid Other

## 2021-09-30 ENCOUNTER — Other Ambulatory Visit: Payer: Self-pay

## 2021-09-30 ENCOUNTER — Ambulatory Visit: Payer: Medicaid Other | Admitting: Oncology

## 2021-10-06 ENCOUNTER — Other Ambulatory Visit (HOSPITAL_COMMUNITY): Payer: Self-pay

## 2021-10-12 ENCOUNTER — Other Ambulatory Visit: Payer: Self-pay | Admitting: Oncology

## 2021-10-12 ENCOUNTER — Inpatient Hospital Stay (INDEPENDENT_AMBULATORY_CARE_PROVIDER_SITE_OTHER): Payer: Medicaid Other | Admitting: Oncology

## 2021-10-12 ENCOUNTER — Inpatient Hospital Stay: Payer: Medicaid Other | Attending: Oncology

## 2021-10-12 ENCOUNTER — Other Ambulatory Visit: Payer: Self-pay | Admitting: Hematology and Oncology

## 2021-10-12 VITALS — BP 157/90 | HR 125 | Temp 98.3°F | Resp 16 | Ht 69.0 in | Wt 199.0 lb

## 2021-10-12 DIAGNOSIS — D500 Iron deficiency anemia secondary to blood loss (chronic): Secondary | ICD-10-CM | POA: Diagnosis not present

## 2021-10-12 DIAGNOSIS — C7800 Secondary malignant neoplasm of unspecified lung: Secondary | ICD-10-CM | POA: Insufficient documentation

## 2021-10-12 DIAGNOSIS — R634 Abnormal weight loss: Secondary | ICD-10-CM | POA: Insufficient documentation

## 2021-10-12 DIAGNOSIS — C7951 Secondary malignant neoplasm of bone: Secondary | ICD-10-CM | POA: Insufficient documentation

## 2021-10-12 DIAGNOSIS — C7802 Secondary malignant neoplasm of left lung: Secondary | ICD-10-CM

## 2021-10-12 DIAGNOSIS — C7801 Secondary malignant neoplasm of right lung: Secondary | ICD-10-CM

## 2021-10-12 DIAGNOSIS — J209 Acute bronchitis, unspecified: Secondary | ICD-10-CM | POA: Insufficient documentation

## 2021-10-12 DIAGNOSIS — C642 Malignant neoplasm of left kidney, except renal pelvis: Secondary | ICD-10-CM | POA: Diagnosis present

## 2021-10-12 DIAGNOSIS — G893 Neoplasm related pain (acute) (chronic): Secondary | ICD-10-CM | POA: Insufficient documentation

## 2021-10-12 LAB — BASIC METABOLIC PANEL
BUN: 26 — AB (ref 4–21)
CO2: 24 — AB (ref 13–22)
Chloride: 95 — AB (ref 99–108)
Creatinine: 1 (ref 0.6–1.3)
Glucose: 145
Potassium: 5.3 mEq/L — AB (ref 3.5–5.1)
Sodium: 133 — AB (ref 137–147)

## 2021-10-12 LAB — COMPREHENSIVE METABOLIC PANEL
Albumin: 3.8 (ref 3.5–5.0)
Calcium: 12.6 — AB (ref 8.7–10.7)

## 2021-10-12 LAB — HEPATIC FUNCTION PANEL
ALT: 81 U/L — AB (ref 10–40)
AST: 34 (ref 14–40)
Alkaline Phosphatase: 195 — AB (ref 25–125)
Bilirubin, Total: 0.8

## 2021-10-12 LAB — CBC AND DIFFERENTIAL
HCT: 32 — AB (ref 41–53)
Hemoglobin: 9.3 — AB (ref 13.5–17.5)
Neutrophils Absolute: 13.77
Platelets: 368 10*3/uL (ref 150–400)
WBC: 16.2

## 2021-10-12 LAB — CBC: RBC: 4.27 (ref 3.87–5.11)

## 2021-10-12 LAB — LACTATE DEHYDROGENASE: LDH: 58 U/L — ABNORMAL LOW (ref 98–192)

## 2021-10-12 MED ORDER — FUROSEMIDE 40 MG PO TABS: 40.0000 mg | ORAL_TABLET | Freq: Every day | ORAL | 0 refills | Status: AC

## 2021-10-12 MED ORDER — MORPHINE SULFATE (CONCENTRATE) 20 MG/ML PO SOLN: 20.0000 mg | ORAL | 0 refills | Status: AC | PRN

## 2021-10-12 MED ORDER — AZITHROMYCIN 250 MG PO TABS: ORAL_TABLET | ORAL | 0 refills | Status: AC

## 2021-10-12 MED ORDER — FERROUS SULFATE 325 (65 FE) MG PO TBEC: 325.0000 mg | DELAYED_RELEASE_TABLET | Freq: Every day | ORAL | 3 refills | Status: AC

## 2021-10-12 MED ORDER — MORPHINE SULFATE ER 30 MG PO TBCR: 30.0000 mg | EXTENDED_RELEASE_TABLET | Freq: Two times a day (BID) | ORAL | 0 refills | Status: AC

## 2021-10-12 NOTE — Addendum Note (Signed)
Addended by: Juanetta Beets on: 10/12/2021 04:52 PM ? ? Modules accepted: Orders ? ?

## 2021-10-12 NOTE — Progress Notes (Signed)
[facility]  [address] [place],    [address] [phone]  Clinic Day:  10/12/21   ASSESSMENT & PLAN:   Assessment & Plan: Lung metastases [facility]) He was on palliative everolimus 10 mg daily, but was placed on hold due to hemoptysis.  The lung metastases were stable on CTA chest in October.  Bronchoscopy in November with Dr. Alcide Clever revealed obstructed right lower lobes by mucus plug with tumor.  Biopsies confirmed metastatic carcinoma, consistent with primary renal cell carcinoma. He was referred for palliative radiation, but the hemoptysis resolved, so radiation was not recommended.  CTA chest done on November 27 in the emergency room evaluate for chest pain revealed progressive disease with a dominant mass in the right lower lobe measuring 43 x 22 mm, previously 35 x 25 mm and a dominant right upper lobe mass measuring 19 mm previously 17 mm. I recommend resuming everolimus 10 mg daily, but he later deteriorated and had a prolonged hospital stay requiring transfusions and treatment of hypercalcemia.  He was discharged to a SNF.  Bone metastasis ([place]) His pain is better controlled since right shoulder surgery, although he still requires regular use of hydrocodone/APAP. The denosumab had been on hold, mainly because he had generalized pain after this.  He has received doses of IV zoledronic acid in the hospital.  However he is now on [place].   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  Due to his high risk of recurrence, we recommended adjuvant oral chemotherapy but sunitinib was discontinued after only 2 weeks of therapy due to uncontrolled hypertension he has been through multiple other therapies, but did not tolerate them. Everolimus was been on hold after he developed hemoptysis in October, but resumed at the end  of December due to progressive lung metastasis.  Since then he was hospitalized with multiple complications and all therapy has been stopped.   Hypercalcemia Resolved with denosumab in November, and zoledronic acid when he was hospitalized.  This has now worsened again to 12.8 today.   Iron deficiency anemia due to chronic blood loss His hemoglobin was as low as 7.4 in the hospital and he was transfused.  He is not currently on iron supplement and so we will have him try that.  His ferritin is over 2000, but I feel this is an acute phase reactant from his cancer.  His hemoglobin is relatively stable at 9.3 today.   Weight loss His appetite is decreased and he is steadily losing weight.  He is now on hospice so we do not plan any aggressive measures.  Acute bronchitis He has wheezing and cough and I think would benefit from a Z-Pak.   We have stopped the everolimus and I explained that we cannot treat him aggressively while he is on hospice, so cannot receive transfusions or treatment for his hypercalcemia.  Furosemide will give him some calcium uric effect and so I will place him on 40 mg daily and recommend that he increase his fluids at the same time.  I will try him on oral ferrous sulfate 1 daily.  His pain is not controlled despite being on a steroid.  We will therefore place him on MS Contin 30 mg every 12 hours with Roxanol concentrate 1 mL every 3 hours as needed for pain and titrate as needed.  I also discussed a  bowel regimen.  I will also place him on a Z-Pak for his acute bronchitis.  I have contacted [facility] and discussed all of these measures with them.  Dr. Marco Collie is their medical director.  I will see him back in 3 weeks with CBC and comprehensive metabolic profile.  The patient understands the plans discussed today and is in agreement with them.  He knows to contact our office if he develops concerns prior to his next appointment.    Derwood Kaplan, MD  [facility] AT [facility] [address] [place] [address] Dept: [phone] Dept Fax: [phone]      CHIEF COMPLAINT:  CC: Metastatic kidney cancer to lung and bone  Current Treatment: Hospice care   HISTORY OF PRESENT ILLNESS:   Oncology History  Malignant neoplasm of left kidney, except renal pelvis ([place])  11/18/2018 Cancer Staging   Staging form: Kidney, AJCC 8th Edition - Clinical stage from 11/18/2018: Stage III (cT3a, cN0, cM0) - Signed by Derwood Kaplan, MD on 01/18/2021 Histopathologic type: Clear cell adenocarcinoma, NOS Stage prefix: Initial diagnosis Histologic grade (G): G3 Histologic grading system: 4 grade system Laterality: Left Tumor size (mm): 95 Lymph-vascular invasion (LVI): Venous (large vessel) invasion only (V) Diagnostic confirmation: Positive histology Specimen type: Excision Staged by: Managing physician Histologic sub-type: Clear cell Sarcomatoid features: Absent Rhabdoid features: Absent Tumor necrosis: Absent Invasion beyond capsule into fat or perisinus tissues: Absent Venous invasion (V): V2 - Macroscopic venous invasion Venous involvement of the kidney: Renal vein Adrenal extension: Negative Stage used in treatment planning: Yes National guidelines used in treatment planning: Yes Type of national guideline used in treatment planning: NCCN Staging comments: Radical nephrectomy.  Attempted adjuvant sunitinib but unable to tolerate    02/17/2020 Initial Diagnosis   Malignant neoplasm of left kidney, except renal pelvis ([facility])       INTERVAL HISTORY:  Steven Peck is here today for repeat clinical assessment.  He had a prolonged hospitalization and was discharged to a SNF.  When he was discharged from there, he was placed on [facility] about 2 weeks ago.  He continues to have pain of the right shoulder that he rates as an 10 out of 10, and the oxycodone 20 mg is not relieving this.   He also has severe pain of the left knee has had injections and arthrocentesis from the orthopedic doctors in the past.  He denies recurrent hemoptysis, but does complain of cough and wheezing.  He does have headaches.  He is on a steroid now.  He denies abdominal pain, bleeding, nausea or bowel problems.  He denies chest pain or abdominal pain.  He denies fevers or chills.  His appetite is decreased. His weight has decreased 15 pounds since his last visit here in March.  His labs show that his hemoglobin has dropped to 9.3.  Comprehensive metabolic profile reveals hypercalcemia with a calcium level of 12.8 corrected.  His SGPT is 81 and his alkaline phosphatase is elevated at 195.  His sodium is low at 133 and potassium high at 5.3.  Nonfasting blood sugar is 145.  BUN is 26 but creatinine normal at 1.0.   Review of Systems  Constitutional:  Positive for appetite change, fatigue and unexpected weight change. Negative for chills and fever.  HENT:  Negative.  Negative for lump/mass, mouth sores and sore throat.   Respiratory:  Positive for cough and wheezing. Negative for shortness of breath.  Cardiovascular: Negative.  Negative for chest pain. Leg swelling: Swelling of the bilateral feet. Gastrointestinal: Negative.  Negative for abdominal pain, constipation, diarrhea, nausea and vomiting.  Endocrine: Negative.   Genitourinary: Negative.  Negative for difficulty urinating, dysuria, frequency and hematuria.   Musculoskeletal:  Positive for arthralgias (Mainly right shoulder, but generalized for the past month). Negative for back pain and myalgias.       He has severe pain of the right shoulder where he has previously had radiation and surgery and rates this as a 10 out of 10.  He also has swelling and pain of the left knee.  Skin: Negative.  Negative for itching, rash and wound.  Neurological:  Positive for headaches. Negative for dizziness, extremity weakness and light-headedness. Numbness: Numbness  of bilateral feet. Hematological: Negative.  Negative for adenopathy.  Psychiatric/Behavioral: Negative.  Negative for depression and sleep disturbance. The patient is not nervous/anxious.     VITALS:  Blood pressure (!) 157/90, pulse (!) 125, temperature 98.3 F (36.8 C), resp. rate 16, height _0  (1.753 m), weight 199 lb (90.3 kg), SpO2 98 %.  Wt Readings from Last 3 Encounters:  10/12/21 199 lb (90.3 kg)  09/09/21 210 lb (95.3 kg)  09/08/21 209 lb 8 oz (95 kg)    Body mass index is 29.39 kg/m.  Performance status (ECOG): 2  PHYSICAL EXAM:  Physical Exam Vitals and nursing note reviewed.  Constitutional:      General: He is not in acute distress.    Appearance: Normal appearance.  HENT:     Head: Normocephalic and atraumatic.     Mouth/Throat:     Mouth: Mucous membranes are moist.     Pharynx: Oropharynx is clear. No oropharyngeal exudate or posterior oropharyngeal erythema.  Eyes:     General: No scleral icterus.    Extraocular Movements: Extraocular movements intact.     Conjunctiva/sclera: Conjunctivae normal.     Pupils: Pupils are equal, round, and reactive to light.  Cardiovascular:     Rate and Rhythm: Regular rhythm. Tachycardia present.     Heart sounds: Normal heart sounds. No murmur heard.   No friction rub. No gallop.  Pulmonary:     Effort: Pulmonary effort is normal.     Breath sounds: Normal breath sounds. No wheezing, rhonchi or rales.  Abdominal:     General: Bowel sounds are normal. There is no distension.     Palpations: Abdomen is soft. There is no hepatomegaly, splenomegaly or mass.     Tenderness: There is no abdominal tenderness.  Musculoskeletal:        General: Normal range of motion.     Cervical back: Normal range of motion and neck supple. No tenderness.     Right lower leg: Edema (Trace pedal edema) present.     Left lower leg: Edema (Trace pedal edema) present.     Comments: Bilateral 1+ edema.  He has swelling and pain of his left  knee.  Lymphadenopathy:     Cervical: No cervical adenopathy.     Upper Body:     Right upper body: No supraclavicular or axillary adenopathy.     Left upper body: No supraclavicular or axillary adenopathy.     Lower Body: No right inguinal adenopathy. No left inguinal adenopathy.  Skin:    General: Skin is warm and dry.     Coloration: Skin is not jaundiced.     Findings: No rash.  Neurological:     Mental Status: He is alert  and oriented to person, place, and time.     Cranial Nerves: No cranial nerve deficit.  Psychiatric:        Mood and Affect: Mood normal.        Behavior: Behavior normal.    LABS:      Latest Ref Rng & Units 10/12/2021   12:00 AM 08/13/2021   12:00 AM 07/13/2021   12:00 AM  CBC  WBC  16.2      5.2      6.6    Hemoglobin 13.5 - 17.5 9.3      9.3      11.0    Hematocrit 41 - 53 32      31      34    Platelets 150 - 400 K/uL 368      363      488       This result is from an external source.      Latest Ref Rng & Units 10/12/2021   12:00 AM 08/13/2021   12:00 AM 07/13/2021   12:00 AM  CMP  BUN 4 - _0 Creatinine 0.6 - 1.3 1.0      1.0      1.0    Sodium 137 - 147 133      140      137    Potassium 3.5 - 5.1 mEq/L 5.3      3.8      4.8    Chloride 99 - 108 95      105      103    CO2 13 - _1 Calcium 8.7 - 10.7 12.6      9.9      9.3    Alkaline Phos 25 - 125 195      92      120    AST 14 - 40 34      18      22    ALT 10 - 40 U/L 81      16      18       This result is from an external source.    Lab Results  Component Value Date   TIBC 395 08/13/2021   TIBC 211 (L) 07/13/2021   TIBC 190 (L) 05/18/2021   FERRITIN 1,837 (H) 08/13/2021   FERRITIN 2,101 (H) 07/13/2021   FERRITIN 1,836 (H) 05/18/2021   IRONPCTSAT 14 (L) 08/13/2021   IRONPCTSAT 10 (L) 07/13/2021   IRONPCTSAT 14 (L) 05/18/2021   Lab Results  Component Value Date   LDH 58 (L) 10/12/2021   LDH 302 (A) 07/29/2020    STUDIES:  No  results found.    HISTORY:   Past Medical History:  Diagnosis Date   Anemia    Asthma    as a child   Cancer ([place])    Left renal cancer   Constipation    Hemoptysis 03/18/2021   Hypertension    Malignant neoplasm of left kidney, except renal pelvis ([place])    Weight loss 05/18/2021    Past Surgical History:  Procedure Laterality Date   CYSTOSCOPY N/A 01/10/2019   Procedure: CYSTOSCOPY;  Surgeon: Alexis Frock, MD;  Location: [facility];  Service: Urology;  Laterality: N/A;   EXCISION / CURETTAGE TUMOR HUMERUS Right  12/26/2020   ROBOT ASSISTED LAPAROSCOPIC NEPHRECTOMY Left 01/10/2019   Procedure: XI ROBOTIC ASSISTED LAPAROSCOPIC NEPHRECTOMY;  Surgeon: Alexis Frock, MD;  Location: [facility];  Service: Urology;  Laterality: Left;  3 HRS    No family history on file.  Social History:  reports that he has never smoked. He has never used smokeless tobacco. He reports that he does not currently use alcohol. He reports that he does not use drugs.The patient is accompanied by his significant other, Cherylene, today.  He has been back at home for 2 weeks, with [facility]  Allergies:  Allergies  Allergen Reactions   Lisinopril Swelling    Angioedema of lips Angioedema of lips Angioedema of lips Angioedema of lips   Tramadol     Other reaction(s): Other (See Comments) Other reaction(s): Other (see comments) Severe headache   Hydrochlorothiazide Swelling   Xgeva [Denosumab] Other (See Comments)    Patient reports generalized pain after xgeva and prefers to not take this medication again.    Current Medications: Current Outpatient Medications  Medication Sig Dispense Refill   azithromycin (ZITHROMAX Z-PAK) 250 MG tablet Take as directed on package 6 each 0   calcium citrate-vitamin D (CITRACAL+D) 315-200 MG-UNIT tablet Take 1 tablet by mouth 2 (two) times daily.     colchicine 0.6 MG tablet TAKE 1 TABLET BY MOUTH TWICE A DAY 180 tablet 1   everolimus (AFINITOR) 10 MG tablet  Take 1 tablet (10 mg total) by mouth daily. 30 tablet 5   ferrous sulfate 325 (65 FE) MG EC tablet Take 1 tablet (325 mg total) by mouth daily with breakfast. 30 tablet 3   furosemide (LASIX) 40 MG tablet Take 1 tablet (40 mg total) by mouth daily. 30 tablet 0   magic mouthwash w/lidocaine SOLN Take 5 mLs by mouth every 3 (three) hours as needed for mouth pain. Or throat pain; swish and spit 240 mL 3   mirtazapine (REMERON) 30 MG tablet TAKE 1 TABLET BY MOUTH AT BEDTIME. 90 tablet 2   morphine (MS CONTIN) 30 MG 12 hr tablet Take 1 tablet (30 mg total) by mouth every 12 (twelve) hours. 60 tablet 0   morphine (ROXANOL) 20 MG/ML concentrated solution Take 1 mL (20 mg total) by mouth every 3 (three) hours as needed for severe pain. 30 mL 0   Multiple Vitamins-Minerals (MULTIVITAMIN WITH MINERALS) tablet Take 1 tablet by mouth daily.     NARCAN 4 MG/0.1ML LIQD nasal spray kit SMARTSIG:1 Both Nares Daily PRN (Patient not taking: Reported on 08/13/2021)     olmesartan (BENICAR) 20 MG tablet Take 1 tablet (20 mg total) by mouth daily. 30 tablet 2   Omega-3 1000 MG CAPS Take by mouth.     ondansetron (ZOFRAN) 4 MG tablet Take 4 mg by mouth every 8 (eight) hours as needed for nausea or vomiting.     oxyCODONE (OXY IR/ROXICODONE) 5 MG immediate release tablet Take 1-2 tablets (5-10 mg total) by mouth every 4 (four) hours as needed for severe pain. 120 tablet 0   pantoprazole (PROTONIX) 40 MG tablet TAKE 1 TABLET BY MOUTH EVERY DAY 90 tablet 0   prochlorperazine (COMPAZINE) 10 MG tablet Take 10 mg by mouth every 6 (six) hours as needed for nausea or vomiting.     SYMBICORT 80-4.5 MCG/ACT inhaler Inhale 2 puffs into the lungs 2 (two) times daily.     VENTOLIN HFA 108 (90 Base) MCG/ACT inhaler SMARTSIG:2 Puff(s) Via Inhaler 4 Times Daily PRN  No current facility-administered medications for this visit.

## 2021-10-19 ENCOUNTER — Other Ambulatory Visit: Payer: Self-pay | Admitting: Hematology and Oncology

## 2021-10-19 DIAGNOSIS — C7951 Secondary malignant neoplasm of bone: Secondary | ICD-10-CM | POA: Diagnosis not present

## 2021-10-19 DIAGNOSIS — C642 Malignant neoplasm of left kidney, except renal pelvis: Secondary | ICD-10-CM | POA: Diagnosis not present

## 2021-10-19 DIAGNOSIS — C7802 Secondary malignant neoplasm of left lung: Secondary | ICD-10-CM | POA: Diagnosis not present

## 2021-10-19 DIAGNOSIS — C7801 Secondary malignant neoplasm of right lung: Secondary | ICD-10-CM | POA: Diagnosis not present

## 2021-10-20 ENCOUNTER — Telehealth: Payer: Self-pay

## 2021-10-20 NOTE — Telephone Encounter (Signed)
-----   Message from Derwood Kaplan, MD sent at 10/19/2021  4:42 PM EDT ----- Yes, I have spoke with her before at [facility]'s request. I spoke with Cherylene today and asked her to relay the info to Jeddo ----- Message ----- From: Georgette Shell, RN Sent: 10/19/2021   4:12 PM EDT To: Derwood Kaplan, MD  Sister has called wanting to speak with you about decisions they need to make.   Palliative vs hospice.  I could not find her name in his chart as someone we can speak to. Deatra Canter [phone]

## 2021-10-21 ENCOUNTER — Other Ambulatory Visit: Payer: Self-pay | Admitting: Oncology

## 2021-10-21 DIAGNOSIS — C7802 Secondary malignant neoplasm of left lung: Secondary | ICD-10-CM | POA: Diagnosis not present

## 2021-10-21 DIAGNOSIS — R000 Tachycardia, unspecified: Secondary | ICD-10-CM | POA: Diagnosis not present

## 2021-10-21 DIAGNOSIS — C7801 Secondary malignant neoplasm of right lung: Secondary | ICD-10-CM | POA: Diagnosis not present

## 2021-10-21 DIAGNOSIS — C642 Malignant neoplasm of left kidney, except renal pelvis: Secondary | ICD-10-CM | POA: Diagnosis not present

## 2021-10-21 DIAGNOSIS — C7951 Secondary malignant neoplasm of bone: Secondary | ICD-10-CM | POA: Diagnosis not present

## 2021-10-26 DIAGNOSIS — C7802 Secondary malignant neoplasm of left lung: Secondary | ICD-10-CM | POA: Diagnosis not present

## 2021-10-26 DIAGNOSIS — C642 Malignant neoplasm of left kidney, except renal pelvis: Secondary | ICD-10-CM | POA: Diagnosis not present

## 2021-10-26 DIAGNOSIS — C7801 Secondary malignant neoplasm of right lung: Secondary | ICD-10-CM | POA: Diagnosis not present

## 2021-10-26 DIAGNOSIS — C7951 Secondary malignant neoplasm of bone: Secondary | ICD-10-CM | POA: Diagnosis not present

## 2021-10-28 DIAGNOSIS — C642 Malignant neoplasm of left kidney, except renal pelvis: Secondary | ICD-10-CM | POA: Diagnosis not present

## 2021-10-28 DIAGNOSIS — C7951 Secondary malignant neoplasm of bone: Secondary | ICD-10-CM | POA: Diagnosis not present

## 2021-10-28 DIAGNOSIS — C7802 Secondary malignant neoplasm of left lung: Secondary | ICD-10-CM | POA: Diagnosis not present

## 2021-10-28 DIAGNOSIS — C7801 Secondary malignant neoplasm of right lung: Secondary | ICD-10-CM | POA: Diagnosis not present

## 2021-11-01 ENCOUNTER — Encounter: Payer: Self-pay | Admitting: Oncology

## 2021-11-02 ENCOUNTER — Other Ambulatory Visit: Payer: Medicaid Other

## 2021-11-02 ENCOUNTER — Ambulatory Visit: Payer: Medicaid Other | Admitting: Hematology and Oncology

## 2021-11-03 ENCOUNTER — Other Ambulatory Visit: Payer: Medicaid Other

## 2021-11-03 ENCOUNTER — Ambulatory Visit: Payer: Medicaid Other | Admitting: Hematology and Oncology

## 2021-11-04 ENCOUNTER — Other Ambulatory Visit: Payer: Medicaid Other

## 2021-11-04 ENCOUNTER — Ambulatory Visit: Payer: Medicaid Other | Admitting: Oncology

## 2021-11-28 DEATH — deceased

## 2021-12-31 IMAGING — DX DG CHEST 2V
2 series · 2 of 2 positions shown · non-contrast
Comparison: Chest x-ray 12/26/2018

CLINICAL DATA: History of nephrectomy for left renal cell cancer.

EXAM:
CHEST - 2 VIEW

[chest pa]
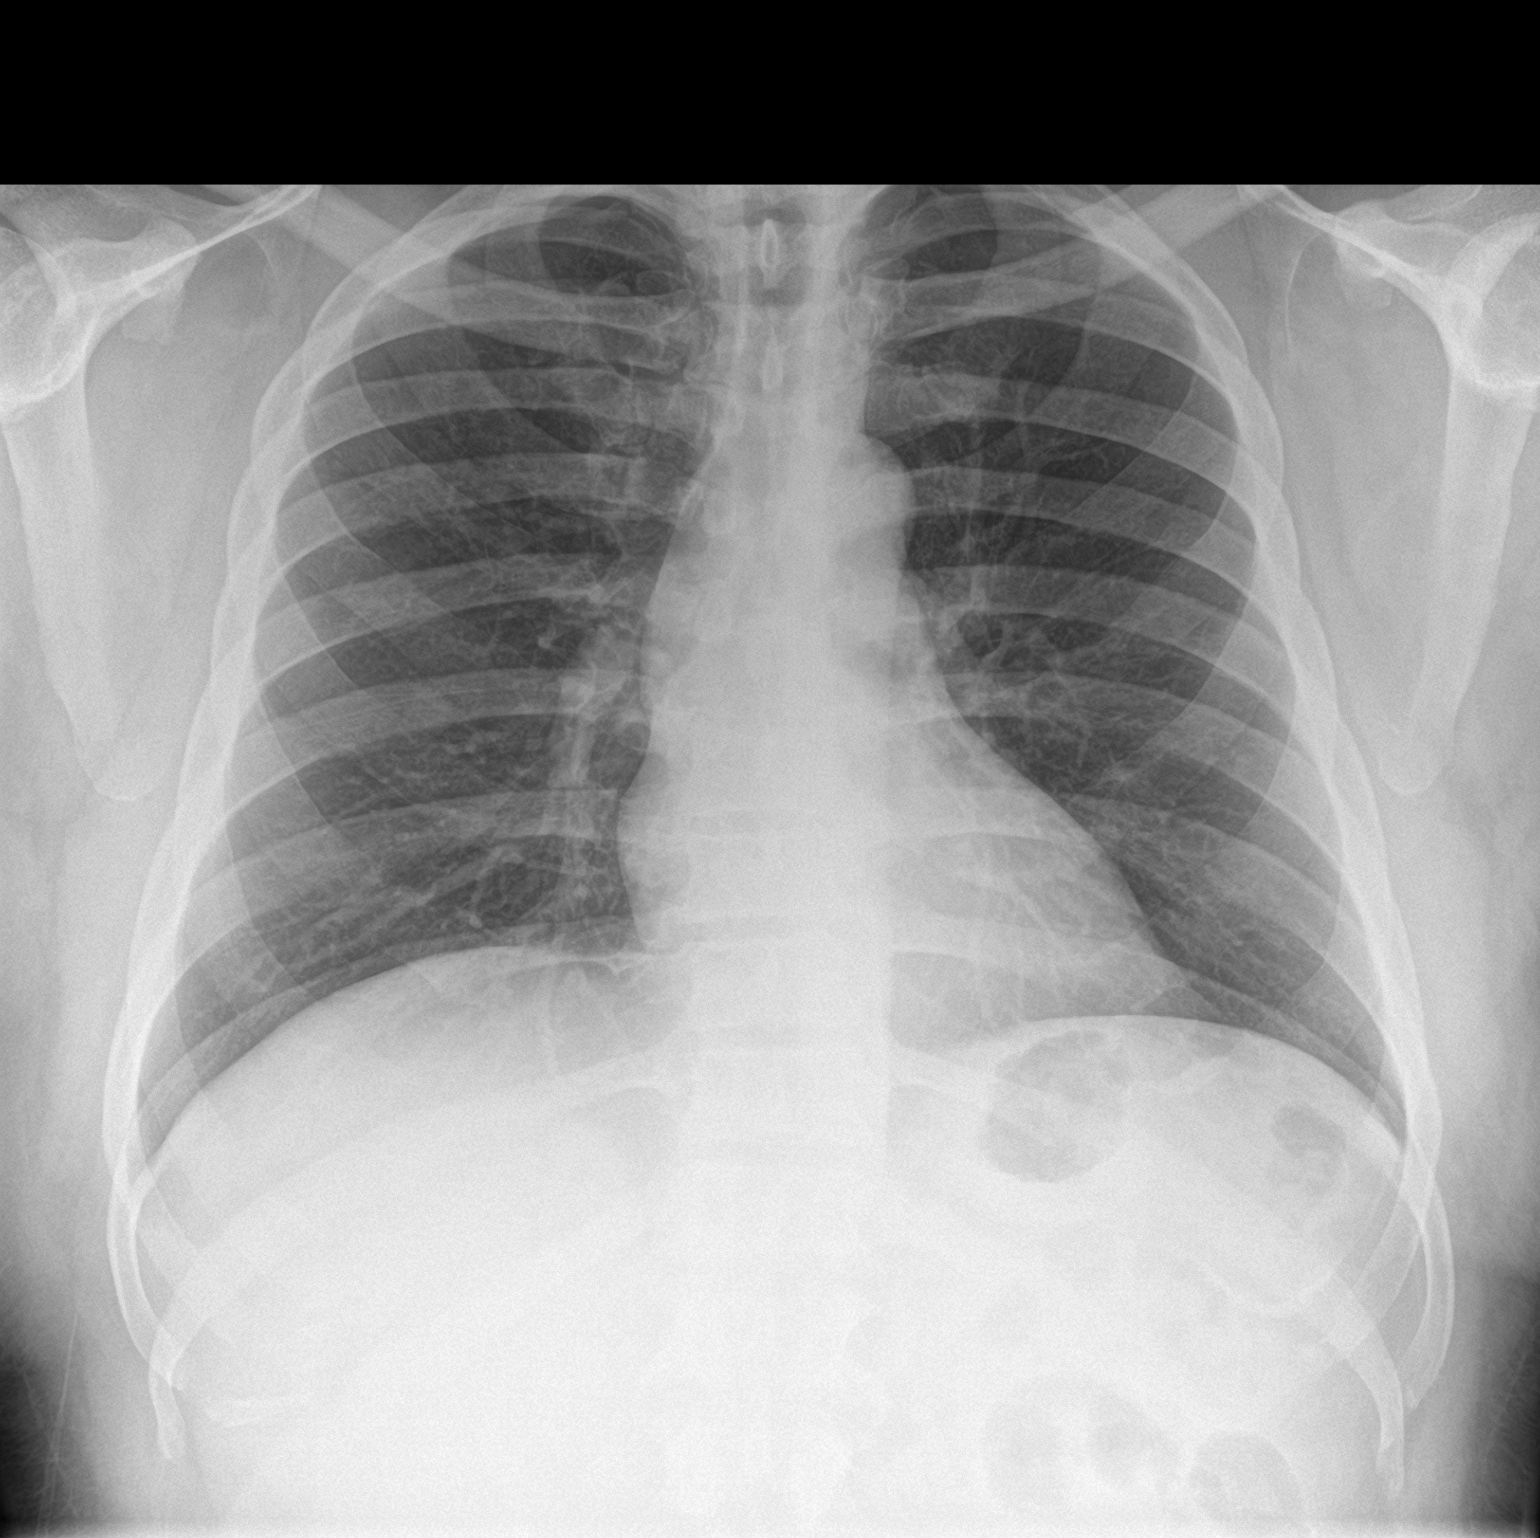

[chest lat]
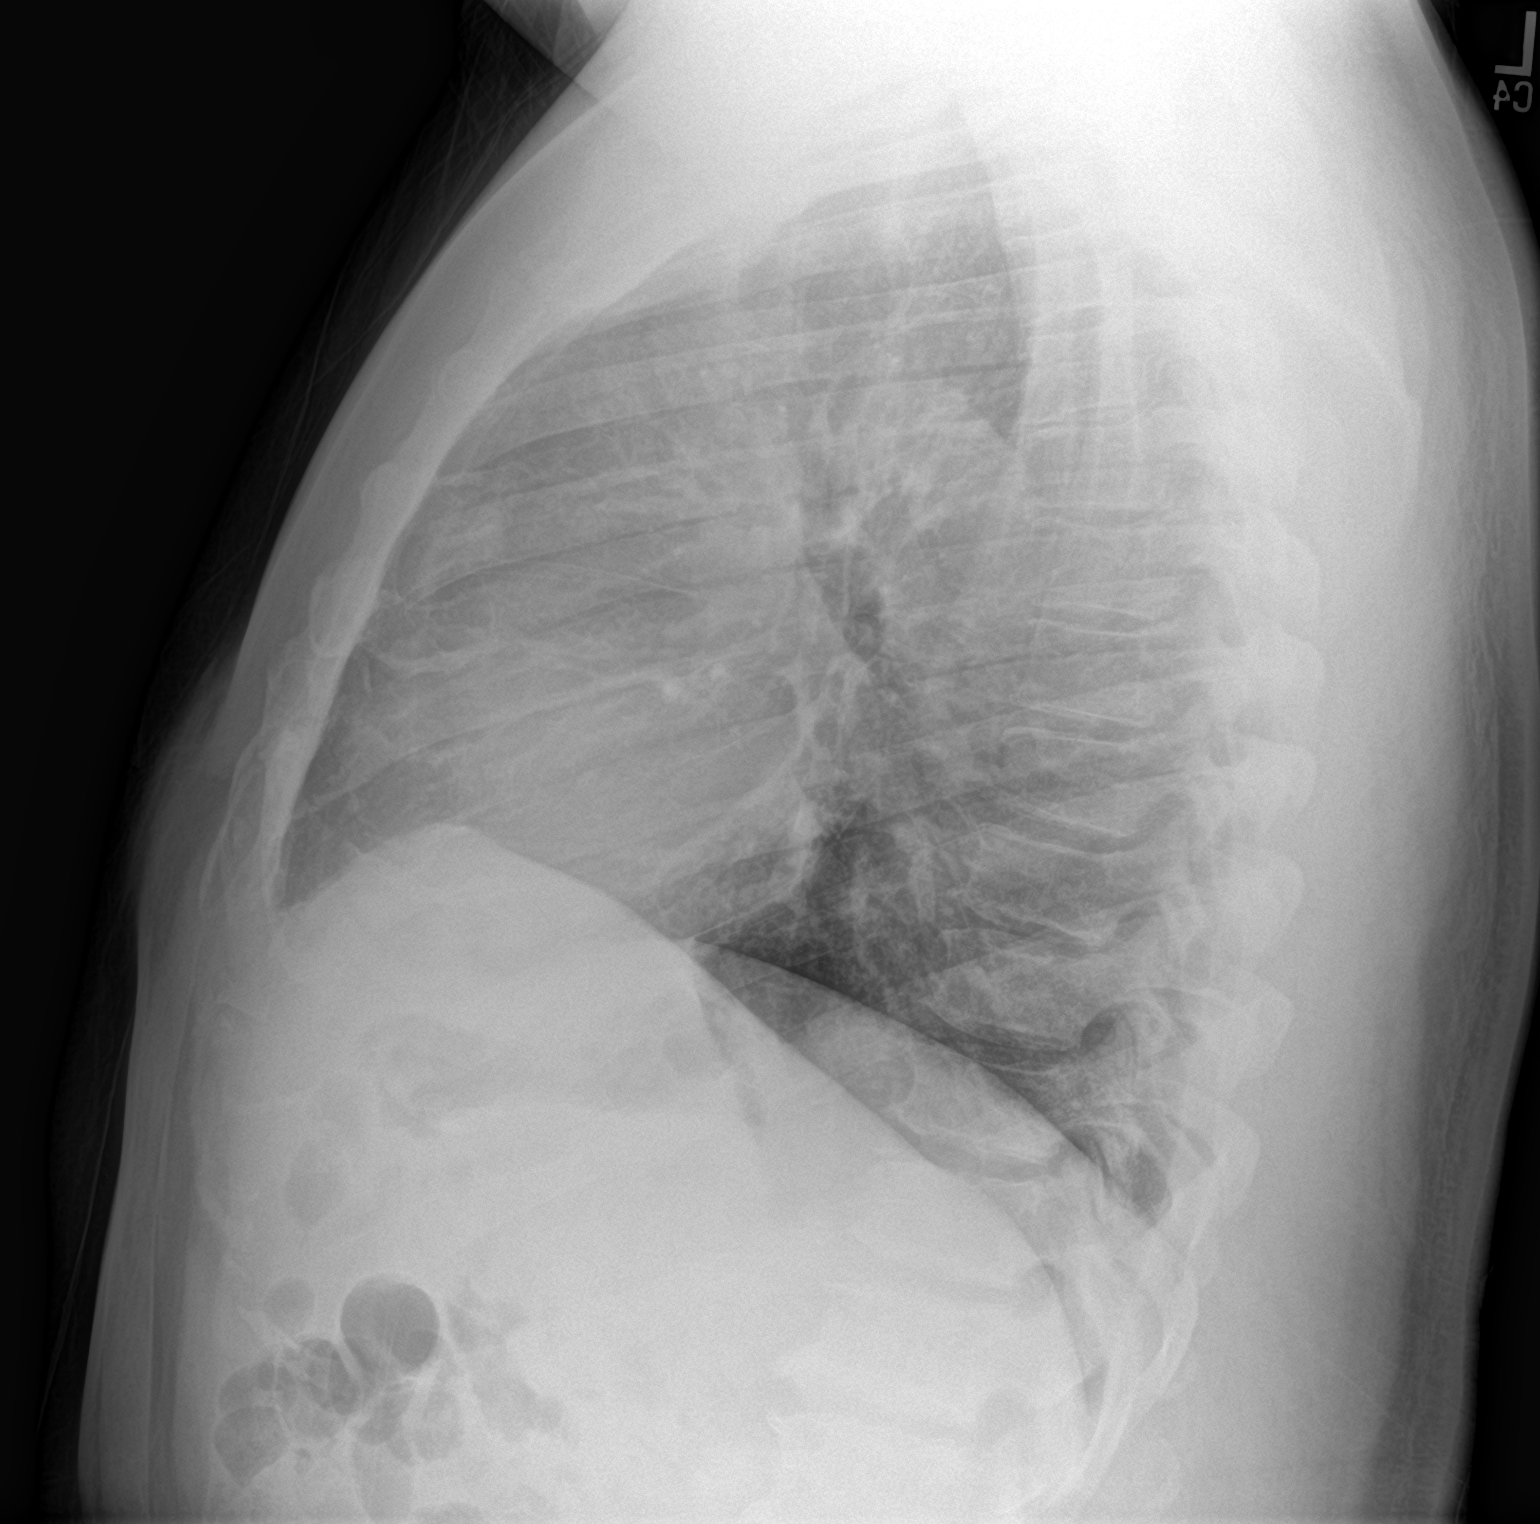

[2 of 2 positions shown; findings below may reference images not displayed]

FINDINGS: The heart size and mediastinal contours are within normal limits.
Both lungs are clear. The visualized skeletal structures are
unremarkable.
IMPRESSION: Normal chest x-ray.  No worrisome pulmonary lesions.

## 2022-09-17 NOTE — Telephone Encounter (Signed)
DOne

## 2022-09-17 NOTE — Telephone Encounter (Signed)
Done
